# Patient Record
Sex: Female | Born: 1990 | Race: White | Hispanic: No | Marital: Married | Smoking: Former smoker
Health system: Southern US, Community
[De-identification: ages and names within clinical notes are randomized; demographics above are authoritative.]

## PROBLEM LIST (undated history)

## (undated) ENCOUNTER — Inpatient Hospital Stay: Payer: Self-pay

## (undated) DIAGNOSIS — K5090 Crohn's disease, unspecified, without complications: Secondary | ICD-10-CM

## (undated) DIAGNOSIS — F419 Anxiety disorder, unspecified: Secondary | ICD-10-CM

## (undated) DIAGNOSIS — D649 Anemia, unspecified: Secondary | ICD-10-CM

## (undated) DIAGNOSIS — N939 Abnormal uterine and vaginal bleeding, unspecified: Secondary | ICD-10-CM

## (undated) DIAGNOSIS — Z8041 Family history of malignant neoplasm of ovary: Secondary | ICD-10-CM

## (undated) DIAGNOSIS — N390 Urinary tract infection, site not specified: Secondary | ICD-10-CM

## (undated) DIAGNOSIS — N809 Endometriosis, unspecified: Secondary | ICD-10-CM

## (undated) DIAGNOSIS — J45909 Unspecified asthma, uncomplicated: Secondary | ICD-10-CM

## (undated) DIAGNOSIS — O99013 Anemia complicating pregnancy, third trimester: Secondary | ICD-10-CM

## (undated) DIAGNOSIS — O039 Complete or unspecified spontaneous abortion without complication: Secondary | ICD-10-CM

## (undated) DIAGNOSIS — N946 Dysmenorrhea, unspecified: Secondary | ICD-10-CM

## (undated) DIAGNOSIS — K589 Irritable bowel syndrome without diarrhea: Secondary | ICD-10-CM

## (undated) HISTORY — DX: Anemia, unspecified: D64.9

## (undated) HISTORY — DX: Anxiety disorder, unspecified: F41.9

## (undated) HISTORY — DX: Endometriosis, unspecified: N80.9

## (undated) HISTORY — DX: Abnormal uterine and vaginal bleeding, unspecified: N93.9

## (undated) HISTORY — DX: Anemia complicating pregnancy, third trimester: O99.013

## (undated) HISTORY — PX: COLONOSCOPY: SHX174

## (undated) HISTORY — DX: Urinary tract infection, site not specified: N39.0

## (undated) HISTORY — DX: Irritable bowel syndrome, unspecified: K58.9

## (undated) HISTORY — DX: Crohn's disease, unspecified, without complications: K50.90

## (undated) HISTORY — PX: ANKLE RECONSTRUCTION: SHX1151

## (undated) HISTORY — DX: Complete or unspecified spontaneous abortion without complication: O03.9

## (undated) HISTORY — DX: Family history of malignant neoplasm of ovary: Z80.41

## (undated) HISTORY — PX: TONSILLECTOMY: SUR1361

## (undated) HISTORY — DX: Dysmenorrhea, unspecified: N94.6

---
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
## 2011-03-06 ENCOUNTER — Ambulatory Visit: Payer: Self-pay | Admitting: Obstetrics and Gynecology

## 2011-03-12 ENCOUNTER — Ambulatory Visit: Payer: Self-pay | Admitting: Obstetrics and Gynecology

## 2011-03-14 LAB — PATHOLOGY REPORT

## 2011-06-17 ENCOUNTER — Other Ambulatory Visit: Payer: Self-pay | Admitting: Obstetrics and Gynecology

## 2011-06-18 ENCOUNTER — Ambulatory Visit: Payer: Self-pay | Admitting: Obstetrics and Gynecology

## 2011-07-26 ENCOUNTER — Encounter: Payer: Self-pay | Admitting: Obstetrics & Gynecology

## 2011-12-13 ENCOUNTER — Observation Stay: Payer: Self-pay | Admitting: Obstetrics and Gynecology

## 2011-12-13 LAB — COMPREHENSIVE METABOLIC PANEL
Albumin: 2.8 g/dL — ABNORMAL LOW (ref 3.4–5.0)
Anion Gap: 10 (ref 7–16)
BUN: 7 mg/dL (ref 7–18)
Bilirubin,Total: 0.2 mg/dL (ref 0.2–1.0)
Calcium, Total: 8.4 mg/dL — ABNORMAL LOW (ref 8.5–10.1)
Chloride: 104 mmol/L (ref 98–107)
Co2: 24 mmol/L (ref 21–32)
Creatinine: 0.5 mg/dL — ABNORMAL LOW (ref 0.60–1.30)
Glucose: 96 mg/dL (ref 65–99)
Potassium: 3.6 mmol/L (ref 3.5–5.1)
SGOT(AST): 24 U/L (ref 15–37)
SGPT (ALT): 18 U/L
Total Protein: 7.1 g/dL (ref 6.4–8.2)

## 2011-12-13 LAB — URINALYSIS, COMPLETE
Bilirubin,UR: NEGATIVE
Blood: NEGATIVE
Glucose,UR: 50 mg/dL (ref 0–75)
Leukocyte Esterase: NEGATIVE
Ph: 6 (ref 4.5–8.0)
Protein: NEGATIVE
RBC,UR: <1 /HPF (ref 0–5)
Squamous Epithelial: 1
WBC UR: 1 /HPF (ref 0–5)

## 2011-12-13 LAB — CBC WITH DIFFERENTIAL/PLATELET
Basophil #: 0 10*3/uL (ref 0.0–0.1)
Basophil %: 0 %
Eosinophil #: 0 10*3/uL (ref 0.0–0.7)
Eosinophil %: 0.1 %
Lymphocyte %: 10.4 %
MCH: 26.7 pg (ref 26.0–34.0)
MCHC: 32.8 g/dL (ref 32.0–36.0)
MCV: 81 fL (ref 80–100)
RDW: 17.5 % — ABNORMAL HIGH (ref 11.5–14.5)
WBC: 11.8 10*3/uL — ABNORMAL HIGH (ref 3.6–11.0)

## 2011-12-25 ENCOUNTER — Observation Stay: Payer: Self-pay | Admitting: Obstetrics and Gynecology

## 2011-12-25 LAB — URINALYSIS, COMPLETE
Bilirubin,UR: NEGATIVE
Blood: NEGATIVE
Glucose,UR: NEGATIVE mg/dL (ref 0–75)
Ketone: NEGATIVE
Nitrite: NEGATIVE
Ph: 7 (ref 4.5–8.0)
RBC,UR: <1 /HPF (ref 0–5)
Specific Gravity: 1.011 (ref 1.003–1.030)

## 2011-12-28 ENCOUNTER — Ambulatory Visit: Payer: Self-pay | Admitting: Oncology

## 2012-01-15 ENCOUNTER — Observation Stay: Payer: Self-pay | Admitting: Obstetrics and Gynecology

## 2012-01-18 ENCOUNTER — Observation Stay: Payer: Self-pay | Admitting: Obstetrics and Gynecology

## 2012-01-19 ENCOUNTER — Observation Stay: Payer: Self-pay | Admitting: Obstetrics and Gynecology

## 2012-01-19 LAB — BASIC METABOLIC PANEL
Calcium, Total: 7.7 mg/dL — ABNORMAL LOW (ref 8.5–10.1)
Co2: 24 mmol/L (ref 21–32)
EGFR (African American): >60
EGFR (Non-African Amer.): >60
Glucose: 191 mg/dL — ABNORMAL HIGH (ref 65–99)
Osmolality: 281 (ref 275–301)
Potassium: 3.9 mmol/L (ref 3.5–5.1)

## 2012-01-28 ENCOUNTER — Observation Stay: Payer: Self-pay

## 2012-01-29 ENCOUNTER — Inpatient Hospital Stay: Payer: Self-pay | Admitting: Obstetrics and Gynecology

## 2012-01-29 LAB — CBC WITH DIFFERENTIAL/PLATELET
Basophil %: 0.1 %
Eosinophil #: 0 10*3/uL (ref 0.0–0.7)
Eosinophil %: 0 %
HCT: 30.3 % — ABNORMAL LOW (ref 35.0–47.0)
Lymphocyte #: 0.8 10*3/uL — ABNORMAL LOW (ref 1.0–3.6)
Lymphocyte %: 5.2 %
MCHC: 31.3 g/dL — ABNORMAL LOW (ref 32.0–36.0)
Monocyte #: 0.5 x10 3/mm (ref 0.2–0.9)
Monocyte %: 3.1 %
Platelet: 187 10*3/uL (ref 150–440)
RBC: 3.84 10*6/uL (ref 3.80–5.20)
WBC: 14.7 10*3/uL — ABNORMAL HIGH (ref 3.6–11.0)

## 2012-01-30 LAB — HEMOGLOBIN: HGB: 8.3 g/dL — ABNORMAL LOW (ref 12.0–16.0)

## 2012-03-24 ENCOUNTER — Emergency Department: Payer: Self-pay | Admitting: Emergency Medicine

## 2012-04-16 ENCOUNTER — Ambulatory Visit: Payer: Self-pay | Admitting: Anesthesiology

## 2012-05-07 ENCOUNTER — Encounter: Payer: Self-pay | Admitting: Anesthesiology

## 2012-05-18 ENCOUNTER — Encounter: Payer: Self-pay | Admitting: Anesthesiology

## 2012-06-17 ENCOUNTER — Encounter: Payer: Self-pay | Admitting: Anesthesiology

## 2012-09-17 LAB — HM COLONOSCOPY

## 2013-01-17 ENCOUNTER — Ambulatory Visit: Payer: Self-pay | Admitting: Family Medicine

## 2013-02-10 ENCOUNTER — Other Ambulatory Visit: Payer: Self-pay | Admitting: Family

## 2013-02-10 LAB — URINALYSIS, COMPLETE
Blood: NEGATIVE
Glucose,UR: NEGATIVE mg/dL (ref 0–75)
Ketone: NEGATIVE
Leukocyte Esterase: NEGATIVE
Nitrite: NEGATIVE
Ph: 6 (ref 4.5–8.0)
RBC,UR: NONE SEEN /HPF (ref 0–5)
Specific Gravity: 1.02 (ref 1.003–1.030)
WBC UR: NONE SEEN /HPF (ref 0–5)

## 2013-02-10 LAB — IRON AND TIBC
Iron Bind.Cap.(Total): 481 ug/dL — ABNORMAL HIGH (ref 250–450)
Iron Saturation: 7 %

## 2013-02-10 LAB — FERRITIN: Ferritin (ARMC): 5 ng/mL — ABNORMAL LOW (ref 8–388)

## 2013-02-10 LAB — PREGNANCY, URINE: Pregnancy Test, Urine: NEGATIVE m[IU]/mL

## 2013-09-19 ENCOUNTER — Ambulatory Visit: Payer: Self-pay

## 2013-09-19 LAB — CBC WITH DIFFERENTIAL/PLATELET
BASOS ABS: 0 10*3/uL (ref 0.0–0.1)
BASOS PCT: 0.2 %
EOS PCT: 0.1 %
Eosinophil #: 0 10*3/uL (ref 0.0–0.7)
HCT: 34.9 % — ABNORMAL LOW (ref 35.0–47.0)
HGB: 11.1 g/dL — AB (ref 12.0–16.0)
LYMPHS PCT: 9.2 %
Lymphocyte #: 0.9 10*3/uL — ABNORMAL LOW (ref 1.0–3.6)
MCH: 25 pg — ABNORMAL LOW (ref 26.0–34.0)
MCHC: 31.9 g/dL — AB (ref 32.0–36.0)
MCV: 79 fL — ABNORMAL LOW (ref 80–100)
Monocyte #: 0.7 x10 3/mm (ref 0.2–0.9)
Monocyte %: 6.4 %
NEUTROS ABS: 8.5 10*3/uL — AB (ref 1.4–6.5)
NEUTROS PCT: 84.1 %
PLATELETS: 181 10*3/uL (ref 150–440)
RBC: 4.44 10*6/uL (ref 3.80–5.20)
RDW: 15 % — AB (ref 11.5–14.5)
WBC: 10.2 10*3/uL (ref 3.6–11.0)

## 2013-09-19 LAB — MONONUCLEOSIS SCREEN: MONO TEST: NEGATIVE

## 2013-09-19 LAB — RAPID STREP-A WITH REFLX: MICRO TEXT REPORT: NEGATIVE

## 2013-09-19 LAB — RAPID INFLUENZA A&B ANTIGENS

## 2013-09-23 LAB — BETA STREP CULTURE(ARMC)

## 2013-09-30 ENCOUNTER — Emergency Department: Payer: Self-pay | Admitting: Internal Medicine

## 2014-01-06 DIAGNOSIS — O039 Complete or unspecified spontaneous abortion without complication: Secondary | ICD-10-CM

## 2014-01-06 HISTORY — DX: Complete or unspecified spontaneous abortion without complication: O03.9

## 2014-08-19 LAB — HM PAP SMEAR
HM PAP: NEGATIVE
HM Pap smear: NEGATIVE

## 2014-09-18 ENCOUNTER — Ambulatory Visit: Payer: Self-pay | Admitting: Internal Medicine

## 2014-09-18 LAB — COMPREHENSIVE METABOLIC PANEL
ALBUMIN: 3.5 g/dL (ref 3.4–5.0)
ALK PHOS: 85 U/L
ANION GAP: 10 (ref 7–16)
BUN: 5 mg/dL — AB (ref 7–18)
Bilirubin,Total: <0.1 mg/dL — ABNORMAL LOW (ref 0.2–1.0)
CALCIUM: 8.7 mg/dL (ref 8.5–10.1)
Chloride: 102 mmol/L (ref 98–107)
Co2: 26 mmol/L (ref 21–32)
Creatinine: 0.8 mg/dL (ref 0.60–1.30)
EGFR (African American): >60
EGFR (Non-African Amer.): >60
GLUCOSE: 96 mg/dL (ref 65–99)
OSMOLALITY: 273 (ref 275–301)
POTASSIUM: 3.5 mmol/L (ref 3.5–5.1)
SGOT(AST): 25 U/L (ref 15–37)
SGPT (ALT): 27 U/L
SODIUM: 138 mmol/L (ref 136–145)
Total Protein: 7.9 g/dL (ref 6.4–8.2)

## 2014-09-18 LAB — PROTIME-INR
INR: 1
PROTHROMBIN TIME: 12.7 s (ref 11.5–14.7)

## 2014-09-18 LAB — CBC WITH DIFFERENTIAL/PLATELET
BASOS ABS: 0 10*3/uL (ref 0.0–0.1)
BASOS PCT: 0.5 %
EOS ABS: 0 10*3/uL (ref 0.0–0.7)
Eosinophil %: 0.7 %
HCT: 34.2 % — AB (ref 35.0–47.0)
HGB: 10.7 g/dL — ABNORMAL LOW (ref 12.0–16.0)
LYMPHS ABS: 1.4 10*3/uL (ref 1.0–3.6)
LYMPHS PCT: 24.6 %
MCH: 24.7 pg — ABNORMAL LOW (ref 26.0–34.0)
MCHC: 31.2 g/dL — AB (ref 32.0–36.0)
MCV: 79 fL — ABNORMAL LOW (ref 80–100)
MONOS PCT: 7.6 %
Monocyte #: 0.4 x10 3/mm (ref 0.2–0.9)
NEUTROS ABS: 3.8 10*3/uL (ref 1.4–6.5)
Neutrophil %: 66.6 %
Platelet: 173 10*3/uL (ref 150–440)
RBC: 4.32 10*6/uL (ref 3.80–5.20)
RDW: 14.5 % (ref 11.5–14.5)
WBC: 5.7 10*3/uL (ref 3.6–11.0)

## 2014-09-18 LAB — TSH: THYROID STIMULATING HORM: 2.08 u[IU]/mL

## 2014-09-18 LAB — APTT: Activated PTT: 27.4 secs (ref 23.6–35.9)

## 2014-09-18 LAB — GC/CHLAMYDIA PROBE AMP

## 2014-10-20 ENCOUNTER — Ambulatory Visit: Payer: Self-pay | Admitting: Otolaryngology

## 2014-11-07 ENCOUNTER — Emergency Department: Payer: Self-pay | Admitting: Emergency Medicine

## 2015-01-07 DIAGNOSIS — N809 Endometriosis, unspecified: Secondary | ICD-10-CM | POA: Insufficient documentation

## 2015-01-07 DIAGNOSIS — F32A Depression, unspecified: Secondary | ICD-10-CM | POA: Insufficient documentation

## 2015-01-07 DIAGNOSIS — N939 Abnormal uterine and vaginal bleeding, unspecified: Secondary | ICD-10-CM | POA: Insufficient documentation

## 2015-01-07 DIAGNOSIS — O039 Complete or unspecified spontaneous abortion without complication: Secondary | ICD-10-CM

## 2015-01-07 DIAGNOSIS — Z720 Tobacco use: Secondary | ICD-10-CM | POA: Insufficient documentation

## 2015-01-07 DIAGNOSIS — N946 Dysmenorrhea, unspecified: Secondary | ICD-10-CM | POA: Insufficient documentation

## 2015-01-07 DIAGNOSIS — F419 Anxiety disorder, unspecified: Secondary | ICD-10-CM | POA: Insufficient documentation

## 2015-01-10 LAB — SURGICAL PATHOLOGY

## 2015-01-25 NOTE — H&P (Signed)
L&D Evaluation:  History:   HPI 24 y/o G1 [facility] 02/02/12    Presents with contractions    Patient's Medical History No Chronic Illness    Patient's Surgical History none    Medications Pre Natal Vitamins    Allergies PCN    Social History none    Family History Non-Contributory   ROS:   ROS All systems were reviewed.  HEENT, CNS, GI, GU, Respiratory, CV, Renal and Musculoskeletal systems were found to be normal.   Exam:   Vital Signs stable    General no apparent distress, back pain with uc's    Abdomen gravid, non-tender    Estimated Fetal Weight Average for gestational age    Back no CVAT    Pelvic 5/100/-1    Ucx regular   Impression:   Impression active labor   Plan:   Comments routine intrapartum orders   Electronic Signatures: Edison Nasuti (MD)  (Signed 14-May-13 18:43)  Authored: L&D Evaluation   Last Updated: 14-May-13 18:43 by Edison Nasuti (MD)

## 2015-01-25 NOTE — H&P (Signed)
L&D Evaluation:  History:   HPI 24yo G1P0 with LMP of ? & EDD at 7 6/7 wks of 02/02/12 with PNC at [facility] significant for Anemia requiring 2 units of blood this pregnancy & Vit B12 injections q 2 weeks.  Pt presents with "UC's all night long and decreased FM tonight", No VB, LOF.    Presents with contractions    Patient's Medical History Hiatal hernia    Patient's Surgical History Lap for endometriosis, endoscopy and colonoscopy    Medications Pre Natal Vitamins    Allergies PCN, Sulfa, Amoxicillin    Social History tobacco  quit tobacco 06/16/11    Family History Non-Contributory   ROS:   ROS All systems were reviewed.  HEENT, CNS, GI, GU, Respiratory, CV, Renal and Musculoskeletal systems were found to be normal.   Exam:   Vital Signs stable    General no apparent distress    Mental Status clear    Chest clear    Heart normal sinus rhythm, no murmur/gallop/rubs    Abdomen gravid, non-tender    Estimated Fetal Weight Small for gestational age    Fetal Position vtx    Back no CVAT    Reflexes 1+    Clonus negative    Pelvic 1-2/80/vtx -2 very post cx    Mebranes Intact    FHT normal rate with no decels, reactive NST with 2 accels 15 x 15 bpm    Ucx irregular    Skin dry    Lymph no lymphadenopathy   Impression:   Impression IUP at 39 3/7 weeks   Plan:   Plan monitor contractions and for cervical change, GBs neg    Comments Cx has not changed since office exam 1-2 weeks ago. Will dc home to rest and fu here if UC's increase or if ROM, VB or decreased FM occurs.   Electronic Signatures: Catheryn Bacon (CNM)  (Signed 13-May-13 07:41)  Authored: L&D Evaluation   Last Updated: 13-May-13 07:41 by Catheryn Bacon (CNM)

## 2015-02-17 ENCOUNTER — Ambulatory Visit (INDEPENDENT_AMBULATORY_CARE_PROVIDER_SITE_OTHER): Payer: 59 | Admitting: Obstetrics and Gynecology

## 2015-02-17 ENCOUNTER — Encounter: Payer: Self-pay | Admitting: Obstetrics and Gynecology

## 2015-02-17 VITALS — BP 102/70 | HR 94 | Ht 64.0 in | Wt 128.2 lb

## 2015-02-17 DIAGNOSIS — N809 Endometriosis, unspecified: Secondary | ICD-10-CM | POA: Diagnosis not present

## 2015-02-17 DIAGNOSIS — Z3169 Encounter for other general counseling and advice on procreation: Secondary | ICD-10-CM

## 2015-02-17 DIAGNOSIS — F419 Anxiety disorder, unspecified: Secondary | ICD-10-CM

## 2015-02-17 NOTE — Progress Notes (Signed)
Patient ID: Sarah Edwards, female   DOB: June 12, 1991, 24 y.o.   MRN: [id number]  OB/GYN CONFERENCE NOTE:  Subjective:       Sarah Edwards is a 24 y.o. G32P1011 female who presents for a conference appointment. Current complaints include: Chronic fatigue, anxiety, symptoms, minimal dysmenorrhea.  Patient is desiring to return to work.  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  She would like to go back on Zoloft.  She had discontinued this medication approximately 3-4 months ago.  Her endometriosis symptoms are minimal with mild dysmenorrhea.  She is currently tryi to conceive and I have recommended that she take multivitamins daily in order to lower spina bifida risk.     Gynecologic History Patient's last menstrual period was 02/03/2015 (exact date). Contraception: none  Obstetric History OB History  Gravida Para Term Preterm AB SAB TAB Ectopic Multiple Living  2 1 1  0 1 1 0 0 0 1    # Outcome Date GA Lbr Len/2nd Weight Sex Delivery Anes PTL Lv  2 Term 09/17/09   6 lb 12 oz (3.062 kg) F Vag-Spont   Y  1 SAB        N       Past Medical History  Diagnosis Date  . SAB (spontaneous abortion) 01/06/2014  . Endometriosis   . Painful menstrual periods   . Abnormal uterine bleeding (AUB)   . Anxiety   . Crohn disease     Past Surgical History  Procedure Laterality Date  . Colonoscopy N/A 2010 and 2011    crohns  . Esophagogastroduodenoscopy endoscopy N/A 2010    Current Outpatient Prescriptions on File Prior to Visit  Medication Sig Dispense Refill  . Norgestimate-Ethinyl Estradiol Triphasic 0.18/0.215/0.25 MG-35 MCG tablet Take 1 tablet by mouth daily.    . sertraline (ZOLOFT) 50 MG tablet Take 50 mg by mouth daily.     No current facility-administered medications on file prior to visit.    Allergies  Allergen Reactions  . Sulfa Antibiotics Nausea And Vomiting    Constant vomiting  . Penicillins Rash    History   Social History  .  Marital Status: Single    Spouse Name: N/A  . Number of Children: N/A  . Years of Education: N/A   Occupational History  . Not on file.   Social History Main Topics  . Smoking status: Current Every Day Smoker -- 0.50 packs/day    Types: Cigarettes  . Smokeless tobacco: Never Used  . Alcohol Use: 0.0 oz/week    0 Standard drinks or equivalent per week  . Drug Use: No  . Sexual Activity: Yes    Birth Control/ Protection: None   Other Topics Concern  . Not on file   Social History Narrative    Family History  Problem Relation Age of Onset  . Cancer Paternal Grandmother     breast   . Ovarian cancer Paternal Grandmother   . Cervical cancer Paternal Grandmother   . Lung cancer Paternal Grandfather   . Leukemia Maternal Grandfather   . Diabetes Mother   . Diabetes Father     The following portions of the patient's history were reviewed and updated as appropriate: allergies, current medications, past family history, past medical history, past social history, past surgical history and problem list.  Review of Systems A comprehensive review of systems was negative except for: Behavioral/Psych: positive for anxiety   Objective:   BP 102/70 mmHg  Pulse 94  Ht 5' 4"  (1.626 m)  Wt 128 lb 4 oz (58.174 kg)  BMI 22.00 kg/m2  LMP 02/03/2015 (Exact Date)   Assessment/Plan:   1.  Anxiety. 2.  Preconception counseling. 3.  Endometriosis.  1.  Resume Zoloft 50 mg a day. 2.  Multivitamin or prenatal vitamin daily. 3.  Prior to restarting birth control pills, check for pregnancy with UPT. 4.  RTC 6 months for annual exam.  Patient Active Problem List   Diagnosis Date Noted  . Crohn disease 01/07/2015  . Endometriosis 01/07/2015  . Tobacco user 01/07/2015  . Anxiety 01/07/2015  . Abnormal uterine bleeding 01/07/2015  . SAB (spontaneous abortion) 01/07/2015  . Painful menstrual periods 01/07/2015       Time: A total of 15 minutes were spent face-to-face with the  patient during this encounter and over half of that time dealt with counseling and coordination of care.  Return to Clinic: 6 months   Ardell Isaacs, MD [facility]

## 2015-05-26 ENCOUNTER — Ambulatory Visit
Admission: EM | Admit: 2015-05-26 | Discharge: 2015-05-26 | Disposition: A | Discharge status: Home / Self Care | Payer: 59 | Attending: Family Medicine | Admitting: Family Medicine

## 2015-05-26 ENCOUNTER — Ambulatory Visit: Payer: 59

## 2015-05-26 DIAGNOSIS — D649 Anemia, unspecified: Secondary | ICD-10-CM | POA: Diagnosis not present

## 2015-05-26 DIAGNOSIS — E876 Hypokalemia: Secondary | ICD-10-CM

## 2015-05-26 DIAGNOSIS — R202 Paresthesia of skin: Secondary | ICD-10-CM

## 2015-05-26 DIAGNOSIS — K5090 Crohn's disease, unspecified, without complications: Secondary | ICD-10-CM | POA: Diagnosis not present

## 2015-05-26 LAB — COMPREHENSIVE METABOLIC PANEL
ALBUMIN: 4.5 g/dL (ref 3.5–5.0)
ALT: 18 U/L (ref 14–54)
AST: 25 U/L (ref 15–41)
Alkaline Phosphatase: 76 U/L (ref 38–126)
Anion gap: 14 (ref 5–15)
BUN: 10 mg/dL (ref 6–20)
CO2: 23 mmol/L (ref 22–32)
CREATININE: 0.67 mg/dL (ref 0.44–1.00)
Calcium: 9.2 mg/dL (ref 8.9–10.3)
Chloride: 101 mmol/L (ref 101–111)
GFR calc Af Amer: >60 mL/min (ref >60)
GLUCOSE: 89 mg/dL (ref 65–99)
Potassium: 3.3 mmol/L — ABNORMAL LOW (ref 3.5–5.1)
SODIUM: 138 mmol/L (ref 135–145)
Total Bilirubin: 0.5 mg/dL (ref 0.3–1.2)
Total Protein: 8.3 g/dL — ABNORMAL HIGH (ref 6.5–8.1)

## 2015-05-26 LAB — CBC WITH DIFFERENTIAL/PLATELET
BASOS ABS: 0 10*3/uL (ref 0–0.1)
BASOS PCT: 1 %
EOS PCT: 1 %
Eosinophils Absolute: 0.1 10*3/uL (ref 0–0.7)
HCT: 35.8 % (ref 35.0–47.0)
Hemoglobin: 11.3 g/dL — ABNORMAL LOW (ref 12.0–16.0)
LYMPHS PCT: 38 %
Lymphs Abs: 2.2 10*3/uL (ref 1.0–3.6)
MCH: 24 pg — ABNORMAL LOW (ref 26.0–34.0)
MCHC: 31.6 g/dL — ABNORMAL LOW (ref 32.0–36.0)
MCV: 76.1 fL — AB (ref 80.0–100.0)
Monocytes Absolute: 0.3 10*3/uL (ref 0.2–0.9)
Monocytes Relative: 6 %
Neutro Abs: 3.2 10*3/uL (ref 1.4–6.5)
Neutrophils Relative %: 54 %
PLATELETS: 240 10*3/uL (ref 150–440)
RBC: 4.7 MIL/uL (ref 3.80–5.20)
RDW: 15.7 % — ABNORMAL HIGH (ref 11.5–14.5)
WBC: 5.9 10*3/uL (ref 3.6–11.0)

## 2015-05-26 LAB — IRON AND TIBC
Iron: 36 ug/dL (ref 28–170)
Saturation Ratios: 7 % — ABNORMAL LOW (ref 10.4–31.8)
TIBC: 502 ug/dL — ABNORMAL HIGH (ref 250–450)
UIBC: 466 ug/dL

## 2015-05-26 LAB — MAGNESIUM: MAGNESIUM: 2.2 mg/dL (ref 1.7–2.4)

## 2015-05-26 MED ORDER — POTASSIUM CHLORIDE CRYS ER 20 MEQ PO TBCR
40.0000 meq | EXTENDED_RELEASE_TABLET | Freq: Once | ORAL | Status: AC
Start: 2015-05-26 — End: 2015-05-26
  Administered 2015-05-26: 40 meq via ORAL

## 2015-05-26 NOTE — ED Notes (Signed)
Pt states "I have right arm and leg pain with numbness for 4 days. I have had these same symptoms off and on for a year but these last four days have been intense and the pain and numbness has not gone away." Pt ambulated to room without problem, clear speech, denies fevers, nausea or vomiting.

## 2015-05-26 NOTE — ED Provider Notes (Addendum)
CSN: [id number]     Arrival date & time 05/26/15  1039 History   First MD Initiated Contact with Patient 05/26/15 1323     Chief Complaint  Patient presents with  . Arm Pain  . Leg Pain   (Consider location/radiation/quality/duration/timing/severity/associated sxs/prior Treatment) HPI Comments: Single caucasian female with recurrent right arm and leg numbness and pain s/p exertion.  Right hand dominant.  Symptoms first started 3 years ago with Crohn's diagnosis and s/p epidural for SVD 2013.  Typically only last for one day and resolve.  This week has been 4 days of symptoms, not getting better.  Patient contacted [facility] Dr Army Melia and unable to get appt here for evaluation.  Has had tonsillectomy this year.  Last Crohn's flare mid August with diarrhea.  Currently with soft brown stools x 4 days TID denied bloody or tarry stools. Usually constipated and stools once every 3 days.  Eats 3 meals a day and snacks.  Hx anemia due to Crohns and endometriosis currently not taking daily medications or multivitamin.  Last labs prior to tonsillectomy.  Has not hand any xrays since epidural.  Works at [employer].  Has noticed dropping things at work last episode Monday.  Leg numbness right with every episode of sexual intercourse.   Denied trauma.  Has tried elevating legs without any relief of symptoms.  Denied loss of bowel/bladder control, saddle paresthesias or leg weakness.  Patient is a 24 y.o. female presenting with arm pain and leg pain. The history is provided by the patient.  Arm Pain This is a recurrent problem. The current episode started more than 2 days ago. The problem occurs daily. The problem has not changed since onset.Pertinent negatives include no chest pain, no abdominal pain, no headaches and no shortness of breath. The symptoms are aggravated by exertion. Nothing relieves the symptoms. She has tried rest, food and water for the symptoms. The treatment provided no relief.  Leg  Pain Associated symptoms: no back pain, no fatigue, no fever and no neck pain     Past Medical History  Diagnosis Date  . SAB (spontaneous abortion) 01/06/2014  . Endometriosis   . Painful menstrual periods   . Abnormal uterine bleeding (AUB)   . Anxiety   . Crohn disease    Past Surgical History  Procedure Laterality Date  . Colonoscopy N/A 2010 and 2011    crohns  . Esophagogastroduodenoscopy endoscopy N/A 2010   Family History  Problem Relation Age of Onset  . Cancer Paternal Grandmother     breast   . Ovarian cancer Paternal Grandmother   . Cervical cancer Paternal Grandmother   . Lung cancer Paternal Grandfather   . Leukemia Maternal Grandfather   . Diabetes Mother   . Diabetes Father    Social History  Substance Use Topics  . Smoking status: Current Every Day Smoker -- 0.50 packs/day    Types: Cigarettes  . Smokeless tobacco: Never Used  . Alcohol Use: 0.0 oz/week    0 Standard drinks or equivalent per week   OB History    Gravida Para Term Preterm AB TAB SAB Ectopic Multiple Living   2 1 1  0 1 0 1 0 0 1     Review of Systems  Constitutional: Negative for fever, chills, diaphoresis, activity change, appetite change and fatigue.  HENT: Negative for congestion, dental problem, drooling, ear discharge, ear pain, facial swelling, hearing loss, mouth sores, nosebleeds, postnasal drip, rhinorrhea, sinus pressure, sneezing, sore throat, trouble swallowing and  voice change.   Eyes: Negative for photophobia, pain, discharge, redness, itching and visual disturbance.  Respiratory: Negative for cough, choking, shortness of breath, wheezing and stridor.   Cardiovascular: Negative for chest pain, palpitations and leg swelling.  Gastrointestinal: Positive for diarrhea and constipation. Negative for nausea, vomiting, abdominal pain, blood in stool, abdominal distention, anal bleeding and rectal pain.  Endocrine: Negative for cold intolerance and heat intolerance.   Genitourinary: Negative for dysuria, urgency, frequency, flank pain, decreased urine volume, enuresis and difficulty urinating.  Musculoskeletal: Positive for myalgias. Negative for back pain, joint swelling, arthralgias, gait problem, neck pain and neck stiffness.  Skin: Negative for color change, pallor, rash and wound.  Allergic/Immunologic: Negative for environmental allergies and food allergies.  Neurological: Positive for weakness. Negative for dizziness, tremors, seizures, syncope, facial asymmetry, speech difficulty, light-headedness, numbness and headaches.  Hematological: Negative for adenopathy. Does not bruise/bleed easily.  Psychiatric/Behavioral: Negative for behavioral problems, confusion, sleep disturbance and agitation.    Allergies  Sulfa antibiotics and Penicillins  Home Medications   Prior to Admission medications   Medication Sig Start Date End Date Taking? Authorizing Provider  Norgestimate-Ethinyl Estradiol Triphasic 0.18/0.215/0.25 MG-35 MCG tablet Take 1 tablet by mouth daily.    Historical Provider, MD  sertraline (ZOLOFT) 50 MG tablet Take 50 mg by mouth daily.    Historical Provider, MD   Meds Ordered and Administered this Visit   Medications  potassium chloride SA (K-DUR,KLOR-CON) CR tablet 40 mEq (40 mEq Oral Given 05/26/15 1601)    BP 112/71 mmHg  Pulse 86  Temp(Src) 97.7 F (36.5 C) (Oral)  Resp 16  Ht 5' 5"  (1.651 m)  Wt 130 lb (58.968 kg)  BMI 21.63 kg/m2  SpO2 100%  LMP 05/25/2015 No data found.   Physical Exam  Constitutional: She is oriented to person, place, and time. Vital signs are normal. She appears well-developed and well-nourished. No distress.  HENT:  Head: Normocephalic and atraumatic.  Right Ear: Hearing, external ear and ear canal normal. A middle ear effusion is present.  Left Ear: Hearing, external ear and ear canal normal. A middle ear effusion is present.  Nose: Nose normal. No mucosal edema, rhinorrhea, nose lacerations,  sinus tenderness, nasal deformity, septal deviation or nasal septal hematoma. No epistaxis.  No foreign bodies. Right sinus exhibits no maxillary sinus tenderness and no frontal sinus tenderness. Left sinus exhibits no maxillary sinus tenderness and no frontal sinus tenderness.  Mouth/Throat: Uvula is midline, oropharynx is clear and moist and mucous membranes are normal. Mucous membranes are not pale, not dry and not cyanotic. She does not have dentures. No oral lesions. No trismus in the jaw. Normal dentition. No dental abscesses, uvula swelling, lacerations or dental caries. No oropharyngeal exudate, posterior oropharyngeal edema, posterior oropharyngeal erythema or tonsillar abscesses.  Bilateral TMs with air fluid level clear; wearing glasses  Eyes: Conjunctivae, EOM and lids are normal. Pupils are equal, round, and reactive to light. Right eye exhibits no discharge. Left eye exhibits no discharge. No scleral icterus.  Neck: Trachea normal and normal range of motion. Neck supple. No tracheal deviation present.  Cardiovascular: Normal rate, regular rhythm, normal heart sounds and intact distal pulses.  Exam reveals no gallop and no friction rub.   No murmur heard. Pulmonary/Chest: Effort normal and breath sounds normal. No stridor. No respiratory distress. She has no wheezes. She has no rales. She exhibits no tenderness.  Abdominal: Soft. Normal appearance. She exhibits no shifting dullness, no distension, no pulsatile liver, no fluid wave,  no abdominal bruit, no ascites, no pulsatile midline mass and no mass. Bowel sounds are decreased. There is no hepatosplenomegaly. There is tenderness in the right lower quadrant and left lower quadrant. There is no rigidity, no rebound, no guarding, no CVA tenderness, no tenderness at McBurney's point and negative Murphy's sign. Hernia confirmed negative in the ventral area.  Dull to percussion x 4 quads  Musculoskeletal: Normal range of motion. She exhibits no  edema or tenderness.       Right shoulder: Normal.       Left shoulder: Normal.       Right elbow: Normal.      Left elbow: Normal.       Right wrist: Normal.       Left wrist: Normal.       Right hip: Normal.       Left hip: Normal.       Right knee: Normal.       Left knee: Normal.       Right ankle: Normal.       Left ankle: Normal.       Cervical back: Normal.       Thoracic back: Normal.       Lumbar back: Normal.       Right upper arm: Normal.       Left upper arm: Normal.       Right forearm: Normal.       Left forearm: Normal.       Arms:      Right hand: Normal.       Left hand: Normal.       Right upper leg: Normal.       Left upper leg: Normal.       Right lower leg: Normal.       Left lower leg: Normal.       Right foot: Normal.       Left foot: Normal.  Full heel/toe gait and AROM c/t/l spine and bilateral arms/legs in exam room;   Lymphadenopathy:    She has no cervical adenopathy.  Neurological: She is alert and oriented to person, place, and time. She is not disoriented. She displays no atrophy and no tremor. No cranial nerve deficit or sensory deficit. She exhibits normal muscle tone. She displays no seizure activity. Coordination and gait normal. GCS eye subscore is 4. GCS verbal subscore is 5. GCS motor subscore is 6.  Reflex Scores:      Brachioradialis reflexes are 2+ on the right side and 2+ on the left side.      Patellar reflexes are 1+ on the right side and 1+ on the left side. Skin: Skin is warm, dry and intact. No rash noted. She is not diaphoretic. No erythema. No pallor.  Psychiatric: She has a normal mood and affect. Her speech is normal and behavior is normal. Judgment and thought content normal. Cognition and memory are normal.  Nursing note and vitals reviewed.   ED Course  Procedures (including critical care time)  Labs Review Labs Reviewed  CBC WITH DIFFERENTIAL/PLATELET - Abnormal; Notable for the following:    Hemoglobin 11.3 (*)     MCV 76.1 (*)    MCH 24.0 (*)    MCHC 31.6 (*)    RDW 15.7 (*)    All other components within normal limits  COMPREHENSIVE METABOLIC PANEL - Abnormal; Notable for the following:    Potassium 3.3 (*)    Total Protein 8.3 (*)  All other components within normal limits  URINE CULTURE  IRON AND TIBC  VITAMIN D 25 HYDROXY  VITAMIN B12  MAGNESIUM    Imaging Review Dg Cervical Spine Complete  05/26/2015   CLINICAL DATA:  Intermittent pain for 1 year with recent progression intermittent right upper extremity radicular symptoms  EXAM: CERVICAL SPINE  4+ VIEWS  COMPARISON:  None.  FINDINGS: Frontal, lateral, open-mouth odontoid, and bilateral oblique views were obtained. There is no fracture or spondylolisthesis. Prevertebral soft tissues and predental space regions are normal. Disc spaces appear intact. No appreciable facet arthropathy.  IMPRESSION: No fracture or dislocation.  No appreciable arthropathy.   Electronically Signed   By: Lowella Grip III M.D.   On: 05/26/2015 14:49   Dg Thoracic Spine 2 View  05/26/2015   CLINICAL DATA:  Chronic back pain. Posterior neck pain with right arm pain and numbness and tingling. Low back pain with right leg numbness and tingling.  EXAM: THORACIC SPINE 2 VIEWS  COMPARISON:  Chest x-ray dated 09/30/2013  FINDINGS: There is no evidence of thoracic spine fracture. Alignment is normal. No other significant bone abnormalities are identified.  IMPRESSION: Normal exam.   Electronically Signed   By: Lorriane Shire M.D.   On: 05/26/2015 14:52   Dg Lumbar Spine Complete  05/26/2015   CLINICAL DATA:  Intermittent pain for 1 year with right lower extremity radicular symptoms  EXAM: LUMBAR SPINE - COMPLETE 4+ VIEW  COMPARISON:  None.  FINDINGS: Frontal, lateral, spot lumbosacral lateral common bile oblique views were obtained. There are 5 non-rib-bearing lumbar type vertebral bodies. No fracture or spondylolisthesis. There is incomplete fusion of the apophysis of the  anterior superior aspect of the L3 vertebral body. Disc spaces appear intact. No appreciable facet arthropathy.  IMPRESSION: No fracture or spondylolisthesis.  No appreciable arthropathy.   Electronically Signed   By: Lowella Grip III M.D.   On: 05/26/2015 14:51    1345 Discussed with patient some labs are ship out and may be next week until I have all results and will call her on cell [phone] with any results not returned today once available.  Discussed nerve compression, vitamin deficiencies, anemia could be causing her symptoms.  Patient agreed to xrays and labs.  Patient verbalized understanding of information/instructions, agreed with plan of care and had no further questions at this time.   1500 discussed CBC and xray results and patient given copy of all reports.  Still awaiting CMP and ship out lab results.  Patient tolerated snack without difficulty. Discussed ergonomics at work and [employer].  Probable repetitive motion injury paresthesia--24 year old at home and patient favors right leg since pregnancy puts more weight on left.  Current shoe treads comletely worn off discussed replacement of foot wear to allow proper body alignment.   Patient verbalized understanding of information/instructions, agreed with plan of care and had no further questions at this time.  1545 discussed CMP results with patient.  Patient agreed to po potassium.  Given copy of CMP results.  Patient verbalized understanding of information/instructions and had no further questions at this time.  Discharged ambulatory after ensuring no nausea/vomiting after potassium chloride administration.  Currently no paresthesias or pain at rest.  Patient quietly read magazines in exam room.  Gait sure and stable in hallway.  Filed Vitals:   05/26/15 1315  BP: 112/71  Pulse: 86  Temp: 97.7 F (36.5 C)  Resp: 16    Medications  potassium chloride SA (K-DUR,KLOR-CON)  CR tablet 40 mEq (40 mEq Oral  Given 05/26/15 1601)  given by RN Raylene Miyamoto.  MDM   1. Paresthesias   2. Anemia, unspecified anemia type   3. Hypokalemia   4. Crohn's disease, without complications    Worsening paresthesias anemia stable improved  but Hypokalemia today po potassium given and discussed potassium rich foods while she is having loose stools probable cause of potassium loss as typically one stool every 3 days now three times a day related to Chrohn's disease. Follow up ER or urgent care if muscle cramps, palpitations, SOB/dyspnea, chest pain, syncope. If all labs normal consider MRI.  Discussed with patient will call with lab results once available for ship outs FE/TIBC/Vit D/Vit B12.   Exitcare handout on hypokalemia, radiculopathy given to patient.  Patient verbalized understanding of information/agreed with plan of care and had no further questions at this time.  Anemia stable.  Patient to follow up with Dr Army Melia for her endometriosis and crohn's disease.  Start multivitamin OTC po daily ensure iron rich, folate foods every day.  Patient given exitcare handout on anemia.  Patient verbalized understanding of information/instructions, agreed with plan of care and had no further questions at this time.   Patient to schedule follow up for re-evaluation [facility] Dr Halina Maidens. Given copies of all radiology and lab reports available by time of discharge.  Discussed bland diet avoid spicy, fried, dairy or large portions of meat until back to normal stools.   Return to the clinic if  symptoms persist or worsen; I have alerted the patient to call if high fever, dehydration, marked weakness, fainting, increased abdominal pain, blood in stool or vomit.  Exitcare handout on crohn's disease given to patient.  Patient verbalized agreement and understanding of treatment plan.   P2:  Hand washing and fitness     Olen Cordial, NP 05/27/15 [id number]  03 Jun 2015 at [place]  patient contacted via telephone and discussed all  lab results Fe/TIBC/Mg/Vit B12, Vit D her results and normal ranges.  Discussed if still having symptoms to follow up with [facility] Dr Carolin Coy. Consider MRI.  Continue multivitamin, stretching.  Patient verbalized understanding of information/instructions, agreed with plan of care and had no further questions at this time.  Olen Cordial, NP 06/03/15 [phone]

## 2015-05-26 NOTE — Discharge Instructions (Signed)
Carpal Tunnel Syndrome Carpal tunnel syndrome is a disorder of the nervous system in the wrist that causes pain, hand weakness, and/or loss of feeling. Carpal tunnel syndrome is caused by the compression, stretching, or irritation of the median nerve at the wrist joint. Athletes who experience carpal tunnel syndrome may notice a decrease in their performance to the condition, especially for sports that require strong hand or wrist action.  SYMPTOMS   Tingling, numbness, or burning pain in the hand or fingers.  Inability to sleep due to pain in the hand.  Sharp pains that shoot from the wrist up the arm or to the fingers, especially at night.  Morning stiffness or cramping of the hand.  Thumb weakness, resulting in difficulty holding objects or making a fist.  Shiny, dry skin on the hand.  Reduced performance in any sport requiring a strong grip. CAUSES   Median nerve damage at the wrist is caused by pressure due to swelling, inflammation, or scarred tissue.  Sources of pressure include:  Repetitive gripping or squeezing that causes inflammation of the tendon sheaths.  Scarring or shortening of the ligament that covers the median nerve.  Traumatic injury to the wrist or forearm such as fracture, sprain, or dislocation.  Prolonged hyperextension (wrist bent backward) or hyperflexion (wrist bent downward) of the wrist. RISK INCREASES WITH:  Diabetes mellitus.  Menopause or amenorrhea.  Rheumatoid arthritis.  Raynaud disease.  Pregnancy.  Gout.  Kidney disease.  Ganglion cyst.  Repetitive hand or wrist action.  Hypothyroidism (underactive thyroid gland).  Repetitive jolting or shaking of the hands or wrist.  Prolonged forceful weight-bearing on the hands. PREVENTION  Bracing the hand and wrist straight during activities that involve repetitive grasping.  For activities that require prolonged extension of the wrist (bending towards the top of the forearm)  periodically change the position of your wrists.  Learn and use proper technique in activities that result in the wrist position in neutral to slight extension.  Avoid bending the wrist into full extension or flexion (up or down).  Keep the wrist in a straight (neutral) position. To keep the wrist in this position, wear a splint.  Avoid repetitive hand and wrist motions.  When possible avoid prolonged grasping of items (steering wheel of a car, a pen, a vacuum cleaner, or a rake).  Loosen your grip for activities that require prolonged grasping of items.  Place keyboards and writing surfaces at the correct height as to decrease strain on the wrist and hand.  Alternate work tasks to avoid prolonged wrist flexion.  Avoid pinching activities (needlework and writing) as they may irritate your carpal tunnel syndrome.  If these activities are necessary, complete them for shorter periods of time.  When writing, use a felt tip or rollerball pen and/or build up the grip on a pen to decrease the forces required for writing. PROGNOSIS  Carpal tunnel syndrome is usually curable with appropriate conservative treatment and sometimes resolves spontaneously. For some cases, surgery is necessary, especially if muscle wasting or nerve changes have developed.  RELATED COMPLICATIONS   Permanent numbness and a weak thumb or fingers in the affected hand.  Permanent paralysis of a portion of the hand and fingers. TREATMENT  Treatment initially consists of stopping activities that aggravate the symptoms as well as medication and ice to reduce inflammation. A wrist splint is often recommended for wear during activities of repetitive motion as well as at night. It is also important to learn and use proper technique when  performing activities that typically cause pain. On occasion, a corticosteroid injection may be given. If symptoms persist despite conservative treatment, surgery may be an option. Surgical  techniques free the pinched or compressed nerve. Carpal tunnel surgery is usually performed on an outpatient basis, meaning you go home the same day as surgery. These procedures provide almost complete relief of all symptoms in 95% of patients. Expect at least 2 weeks for healing after surgery. For cases that are the result of repeated jolting or shaking of the hand or wrist or prolonged hyperextension, surgery is not usually recommended because stretching of the median nerve, not compression, is usually the cause of carpal tunnel syndrome in these cases. MEDICATION   If pain medication is necessary, nonsteroidal anti-inflammatory medications, such as aspirin and ibuprofen, or other minor pain relievers, such as acetaminophen, are often recommended.  Do not take pain medication for 7 days before surgery.  Prescription pain relievers are usually only prescribed after surgery. Use only as directed and only as much as you need.  Corticosteroid injections may be given to reduce inflammation. However, they are not always recommended.  Vitamin B6 (pyridoxine) may reduce symptoms; use only if prescribed for your disorder. SEEK MEDICAL CARE IF:   Symptoms get worse or do not improve in 2 weeks despite treatment.  You also have a current or recent history of neck or shoulder injury that has resulted in pain or tingling elsewhere in your arm. Document Released: 09/03/2005 Document Revised: 01/18/2014 Document Reviewed: 12/16/2008 [facility] Patient Information 2015 [place], [place]. This information is not intended to replace advice given to you by your health care provider. Make sure you discuss any questions you have with your health care provider. Lumbosacral Radiculopathy Lumbosacral radiculopathy is a pinched nerve or nerves in the low back (lumbosacral area). When this happens you may have weakness in your legs and may not be able to stand on your toes. You may have pain going down into your legs. There  may be difficulties with walking normally. There are many causes of this problem. Sometimes this may happen from an injury, or simply from arthritis or boney problems. It may also be caused by other illnesses such as diabetes. If there is no improvement after treatment, further studies may be done to find the exact cause. DIAGNOSIS  X-rays may be needed if the problems become long standing. Electromyograms may be done. This study is one in which the working of nerves and muscles is studied. HOME CARE INSTRUCTIONS   Applications of ice packs may be helpful. Ice can be used in a plastic bag with a towel around it to prevent frostbite to skin. This may be used every 2 hours for 20 to 30 minutes, or as needed, while awake, or as directed by your caregiver.  Only take over-the-counter or prescription medicines for pain, discomfort, or fever as directed by your caregiver.  If physical therapy was prescribed, follow your caregiver's directions. SEEK IMMEDIATE MEDICAL CARE IF:   You have pain not controlled with medications.  You seem to be getting worse rather than better.  You develop increasing weakness in your legs.  You develop loss of bowel or bladder control.  You have difficulty with walking or balance, or develop clumsiness in the use of your legs.  You have a fever. MAKE SURE YOU:   Understand these instructions.  Will watch your condition.  Will get help right away if you are not doing well or get worse. Document Released: 09/03/2005 Document  Revised: 11/26/2011 Document Reviewed: 04/23/2008 [facility] Patient Information 2015 [place], [place]. This information is not intended to replace advice given to you by your health care provider. Make sure you discuss any questions you have with your health care provider. Cervical Radiculopathy Cervical radiculopathy happens when a nerve in the neck is pinched or bruised by a slipped (herniated) disk or by arthritic changes in the bones of the  cervical spine. This can occur due to an injury or as part of the normal aging process. Pressure on the cervical nerves can cause pain or numbness that runs from your neck all the way down into your arm and fingers. CAUSES  There are many possible causes, including:  Injury.  Muscle tightness in the neck from overuse.  Swollen, painful joints (arthritis).  Breakdown or degeneration in the bones and joints of the spine (spondylosis) due to aging.  Bone spurs that may develop near the cervical nerves. SYMPTOMS  Symptoms include pain, weakness, or numbness in the affected arm and hand. Pain can be severe or irritating. Symptoms may be worse when extending or turning the neck. DIAGNOSIS  Your caregiver will ask about your symptoms and do a physical exam. He or she may test your strength and reflexes. X-rays, CT scans, and MRI scans may be needed in cases of injury or if the symptoms do not go away after a period of time. Electromyography (EMG) or nerve conduction testing may be done to study how your nerves and muscles are working. TREATMENT  Your caregiver may recommend certain exercises to help relieve your symptoms. Cervical radiculopathy can, and often does, get better with time and treatment. If your problems continue, treatment options may include:  Wearing a soft collar for short periods of time.  Physical therapy to strengthen the neck muscles.  Medicines, such as nonsteroidal anti-inflammatory drugs (NSAIDs), oral corticosteroids, or spinal injections.  Surgery. Different types of surgery may be done depending on the cause of your problems. HOME CARE INSTRUCTIONS   Put ice on the affected area.  Put ice in a plastic bag.  Place a towel between your skin and the bag.  Leave the ice on for 15-20 minutes, 03-04 times a day or as directed by your caregiver.  If ice does not help, you can try using heat. Take a warm shower or bath, or use a hot water bottle as directed by your  caregiver.  You may try a gentle neck and shoulder massage.  Use a flat pillow when you sleep.  Only take over-the-counter or prescription medicines for pain, discomfort, or fever as directed by your caregiver.  If physical therapy was prescribed, follow your caregiver's directions.  If a soft collar was prescribed, use it as directed. SEEK IMMEDIATE MEDICAL CARE IF:   Your pain gets much worse and cannot be controlled with medicines.  You have weakness or numbness in your hand, arm, face, or leg.  You have a high fever or a stiff, rigid neck.  You lose bowel or bladder control (incontinence).  You have trouble with walking, balance, or speaking. MAKE SURE YOU:   Understand these instructions.  Will watch your condition.  Will get help right away if you are not doing well or get worse. Document Released: 05/29/2001 Document Revised: 11/26/2011 Document Reviewed: 04/17/2011 [facility] Patient Information 2015 [place], [place]. This information is not intended to replace advice given to you by your health care provider. Make sure you discuss any questions you have with your health care provider. Crohn  Disease Crohn disease is a long-term (chronic) soreness and redness (inflammation) of the intestines (bowel). It can affect any portion of the digestive tract, from the mouth to the anus. It can also cause problems outside the digestive tract. Crohn disease is closely related to a disease called ulcerative colitis (together, these two diseases are called inflammatory bowel disease).  CAUSES  The cause of Crohn disease is not known. [employer] is that, in an easily affected person, the immune system is triggered to attack the body's own digestive tissue. Crohn disease runs in families. It seems to be more common in certain geographic areas and amongst certain races. There are no clear-cut dietary causes.  SYMPTOMS  Crohn disease can cause many different symptoms since it can affect many  different parts of the body. Symptoms include:  Fatigue.  Weight loss.  Chronic diarrhea, sometime bloody.  Abdominal pain and cramps.  Fever.  Ulcers or canker sores in the mouth or rectum.  Anemia (low red blood cells).  Arthritis, skin problems, and eye problems may occur. Complications of Crohn disease can include:  Series of holes (perforation) of the bowel.  Portions of the intestines sticking to each other (adhesions).  Obstruction of the bowel.  Fistula formation, typically in the rectal area but also in other areas. A fistula is an opening between the bowels and the outside, or between the bowels and another organ.  A painful crack in the mucous membrane of the anus (rectal fissure). DIAGNOSIS  Your caregiver may suspect Crohn disease based on your symptoms and an exam. Blood tests may confirm that there is a problem. You may be asked to submit a stool specimen for examination. X-rays and CT scans may be necessary. Ultimately, the diagnosis is usually made after a procedure that uses a flexible tube that is inserted via your mouth or your anus. This is done under sedation and is called either an upper endoscopy or colonoscopy. With these tests, the specialist can take tiny tissue samples and remove them from the inside of the bowel (biopsy). Examination of this biopsy tissue under a microscope can reveal Crohn disease as the cause of your symptoms. Due to the many different forms that Crohn disease can take, symptoms may be present for several years before a diagnosis is made. TREATMENT  Medications are often used to decrease inflammation and control the immune system. These include medicines related to aspirin, steroid medications, and newer and stronger medications to slow down the immune system. Some medications may be used as suppositories or enemas. A number of other medications are used or have been studied. Your caregiver will make specific recommendations. HOME CARE  INSTRUCTIONS   Symptoms such as diarrhea can be controlled with medications. Avoid foods that have a laxative effect such as fresh fruit, vegetables, and dairy products. During flare-ups, you can rest your bowel by refraining from solid foods. Drink clear liquids frequently during the day. (Electrolyte or rehydrating fluids are best. Your caregiver can help you with suggestions.) Drink often to prevent loss of body fluids (dehydration). When diarrhea has cleared, eat small meals and more frequently. Avoid food additives and stimulants such as caffeine (coffee, tea, or chocolate). Enzyme supplements may help if you develop intolerance to a sugar in dairy products (lactose). Ask your caregiver or dietitian about specific dietary instructions.  Try to maintain a positive attitude. Learn relaxation techniques such as self-hypnosis, mental imaging, and muscle relaxation.  If possible, avoid stresses which can aggravate your condition.  Exercise  regularly.  Follow your diet.  Always get plenty of rest. SEEK MEDICAL CARE IF:   Your symptoms fail to improve after a week or two of new treatment.  You experience continued weight loss.  You have ongoing cramps or loose bowels.  You develop a new skin rash, skin sores, or eye problems. SEEK IMMEDIATE MEDICAL CARE IF:   You have worsening of your symptoms or develop new symptoms.  You have a fever.  You develop bloody diarrhea.  You develop severe abdominal pain. MAKE SURE YOU:   Understand these instructions.  Will watch your condition.  Will get help right away if you are not doing well or get worse. Document Released: 06/13/2005 Document Revised: 01/18/2014 Document Reviewed: 05/12/2007 [facility] Patient Information 2015 [place], [place]. This information is not intended to replace advice given to you by your health care provider. Make sure you discuss any questions you have with your health care provider. Hypokalemia Hypokalemia means  that the amount of potassium in the blood is lower than normal.Potassium is a chemical, called an electrolyte, that helps regulate the amount of fluid in the body. It also stimulates muscle contraction and helps nerves function properly.Most of the body's potassium is inside of cells, and only a very small amount is in the blood. Because the amount in the blood is so small, minor changes can be life-threatening. CAUSES  Antibiotics.  Diarrhea or vomiting.  Using laxatives too much, which can cause diarrhea.  Chronic kidney disease.  Water pills (diuretics).  Eating disorders (bulimia).  Low magnesium level.  Sweating a lot. SIGNS AND SYMPTOMS  Weakness.  Constipation.  Fatigue.  Muscle cramps.  Mental confusion.  Skipped heartbeats or irregular heartbeat (palpitations).  Tingling or numbness. DIAGNOSIS  Your health care provider can diagnose hypokalemia with blood tests. In addition to checking your potassium level, your health care provider may also check other lab tests. TREATMENT Hypokalemia can be treated with potassium supplements taken by mouth or adjustments in your current medicines. If your potassium level is very low, you may need to get potassium through a vein (IV) and be monitored in the hospital. A diet high in potassium is also helpful. Foods high in potassium are:  Nuts, such as peanuts and pistachios.  Seeds, such as sunflower seeds and pumpkin seeds.  Peas, lentils, and lima beans.  Whole grain and bran cereals and breads.  Fresh fruit and vegetables, such as apricots, avocado, bananas, cantaloupe, kiwi, oranges, tomatoes, asparagus, and potatoes.  Orange and tomato juices.  Red meats.  Fruit yogurt. HOME CARE INSTRUCTIONS  Take all medicines as prescribed by your health care provider.  Maintain a healthy diet by including nutritious food, such as fruits, vegetables, nuts, whole grains, and lean meats.  If you are taking a laxative, be  sure to follow the directions on the label. SEEK MEDICAL CARE IF:  Your weakness gets worse.  You feel your heart pounding or racing.  You are vomiting or having diarrhea.  You are diabetic and having trouble keeping your blood glucose in the normal range. SEEK IMMEDIATE MEDICAL CARE IF:  You have chest pain, shortness of breath, or dizziness.  You are vomiting or having diarrhea for more than 2 days.  You faint. MAKE SURE YOU:   Understand these instructions.  Will watch your condition.  Will get help right away if you are not doing well or get worse. Document Released: 09/03/2005 Document Revised: 06/24/2013 Document Reviewed: 03/06/2013 [facility] Patient Information 2015 [place], [place]. This  information is not intended to replace advice given to you by your health care provider. Make sure you discuss any questions you have with your health care provider.

## 2015-05-27 LAB — VITAMIN D 25 HYDROXY (VIT D DEFICIENCY, FRACTURES): Vit D, 25-Hydroxy: 40.8 ng/mL (ref 30.0–100.0)

## 2015-05-27 LAB — VITAMIN B12: Vitamin B-12: 454 pg/mL (ref 180–914)

## 2015-05-31 LAB — URINE CULTURE

## 2015-07-19 ENCOUNTER — Ambulatory Visit: Payer: 59 | Admitting: Internal Medicine

## 2015-08-23 ENCOUNTER — Encounter: Payer: Self-pay | Admitting: Obstetrics and Gynecology

## 2015-08-23 ENCOUNTER — Ambulatory Visit (INDEPENDENT_AMBULATORY_CARE_PROVIDER_SITE_OTHER): Payer: 59 | Admitting: Obstetrics and Gynecology

## 2015-08-23 VITALS — BP 106/70 | HR 91 | Ht 64.0 in | Wt 132.8 lb

## 2015-08-23 DIAGNOSIS — Z01419 Encounter for gynecological examination (general) (routine) without abnormal findings: Secondary | ICD-10-CM | POA: Diagnosis not present

## 2015-08-23 DIAGNOSIS — N809 Endometriosis, unspecified: Secondary | ICD-10-CM | POA: Diagnosis not present

## 2015-08-23 MED ORDER — IBUPROFEN 800 MG PO TABS: 800.0000 mg | ORAL_TABLET | Freq: Three times a day (TID) | ORAL | Status: DC | PRN

## 2015-08-23 NOTE — Patient Instructions (Signed)
1.  Pap smear is done today. 2.  Continue self breast exam monthly. 3.  Healthy eating and exercise were encouraged. 4.  Motrin 800 mg 3 times a day  for Severe dysmenorrhea 5.  Minimize narcotic use as much as possible. 6.  Multivitamin/orPrenatal vitamin daily 7.  Return in 1 year for annual exam

## 2015-08-23 NOTE — Progress Notes (Signed)
Patient ID: Sarah Edwards, female   DOB: 04-14-1991, 24 y.o.   MRN: [id number] ANNUAL PREVENTATIVE CARE GYN  ENCOUNTER NOTE  Subjective:       Sarah Edwards is a 24 y.o. G45P1011 female here for a routine annual gynecologic exam.  Current complaints: 1.  Terrible cycle- really bad cramps- has endometriosis  The patient has been attempting to conceive, having discontinued birth control pills several months ago.  She is on a multivitamin.   Pelvic cramps are severe, to where she is taking occasional narcotics for pain relief.  She has been missing 3-5 days of work a month on her severe pelvic discomfort.  The patient does not desire hormonal intervention at this time for attempted relief of pelvic pain.   Gynecologic History Patient's last menstrual period was 08/12/2015 (approximate). Contraception: none currently attempting to conceive.  Obstetric History OB History  Gravida Para Term Preterm AB SAB TAB Ectopic Multiple Living  2 1 1  0 1 1 0 0 0 1    # Outcome Date GA Lbr Len/2nd Weight Sex Delivery Anes PTL Lv  2 Term 09/17/09   6 lb 12 oz (3.062 kg) F Vag-Spont   Y  1 SAB        N       Past Medical History  Diagnosis Date  . SAB (spontaneous abortion) 01/06/2014  . Endometriosis   . Painful menstrual periods   . Abnormal uterine bleeding (AUB)   . Anxiety   . Crohn disease [facility])     Past Surgical History  Procedure Laterality Date  . Colonoscopy N/A 2010 and 2011    crohns  . Esophagogastroduodenoscopy endoscopy N/A 2010    No current outpatient prescriptions on file prior to visit.   No current facility-administered medications on file prior to visit.    Allergies  Allergen Reactions  . Amoxicillin   . Azithromycin Other (See Comments)  . Sulfa Antibiotics Nausea And Vomiting    Constant vomiting  . Penicillins Rash    Social History   Social History  . Marital Status: Single    Spouse Name: N/A  . Number of Children: N/A  . Years of Education: N/A    Occupational History  . Not on file.   Social History Main Topics  . Smoking status: Former Smoker -- 0.50 packs/day    Types: Cigarettes    Quit date: 03/18/2015  . Smokeless tobacco: Never Used  . Alcohol Use: 0.0 oz/week    0 Standard drinks or equivalent per week     Comment: occas  . Drug Use: No  . Sexual Activity: Yes    Birth Control/ Protection: None   Other Topics Concern  . Not on file   Social History Narrative    Family History  Problem Relation Age of Onset  . Cancer Paternal Grandmother     breast   . Ovarian cancer Paternal Grandmother   . Cervical cancer Paternal Grandmother   . Lung cancer Paternal Grandfather   . Leukemia Maternal Grandfather   . Diabetes Mother   . Diabetes Father     The following portions of the patient's history were reviewed and updated as appropriate: allergies, current medications, past family history, past medical history, past social history, past surgical history and problem list.  Review of Systems ROS Review of Systems - General ROS: negative for - chills, fatigue, fever, hot flashes, night sweats, weight gain or weight loss Psychological ROS: negative for - anxiety, decreased libido,  depression, mood swings, physical abuse or sexual abuse Ophthalmic ROS: negative for - blurry vision, eye pain or loss of vision ENT ROS: negative for - headaches, hearing change, visual changes or vocal changes Allergy and Immunology ROS: negative for - hives, itchy/watery eyes or seasonal allergies Hematological and Lymphatic ROS: negative for - bleeding problems, bruising, swollen lymph nodes or weight loss Endocrine ROS: negative for - galactorrhea, hair pattern changes, hot flashes, malaise/lethargy, mood swings, palpitations, polydipsia/polyuria, skin changes, temperature intolerance or unexpected weight changes Breast ROS: negative for - new or changing breast lumps or nipple discharge Respiratory ROS: negative for - cough or  shortness of breath Cardiovascular ROS: negative for - chest pain, irregular heartbeat, palpitations or shortness of breath Gastrointestinal ROS: no , change in bowel habits, or black or bloody stools Genito-Urinary ROS: no dysuria, trouble voiding, or hematuria.  Positive scratch that POSITIVE-severe menstrual cramps Musculoskeletal ROS: negative for - joint pain or joint stiffness Neurological ROS: negative for - bowel and bladder control changes Dermatological ROS: negative for rash and skin lesion changes   Objective:   BP 106/70 mmHg  Pulse 91  Ht 5' 4"  (1.626 m)  Wt 132 lb 12.8 oz (60.238 kg)  BMI 22.78 kg/m2  LMP 08/12/2015 (Approximate) CONSTITUTIONAL: Well-developed, well-nourished female in no acute distress.  PSYCHIATRIC: Normal mood and affect. Normal behavior. Normal judgment and thought content. [place]: Alert and oriented to person, place, and time. Normal muscle tone coordination. No cranial nerve deficit noted. HENT:  Normocephalic, atraumatic, External right and left ear normal. Oropharynx is clear and moist EYES: Conjunctivae and EOM are normal. Pupils are equal, round, and reactive to light. No scleral icterus.  NECK: Normal range of motion, supple, no masses.  Normal thyroid.  SKIN: Skin is warm and dry. No rash noted. Not diaphoretic. No erythema. No pallor. CARDIOVASCULAR: Normal heart rate noted, regular rhythm, no murmur. RESPIRATORY: Clear to auscultation bilaterally. Effort and breath sounds normal, no problems with respiration noted. BREASTS: Symmetric in size. No masses, skin changes, nipple drainage, or lymphadenopathy. ABDOMEN: Soft, normal bowel sounds, no distention noted.  No tenderness, rebound or guarding.  BLADDER: Normal PELVIC:  External Genitalia: Normal  BUS: Normal  Vagina: Normal  Cervix: Normal; 1/4.  Cervical motion tenderness  Uterus: Normal; midplane, 1/4 tender, mobile  Adnexa: Normal  RV: External Exam NormaI  MUSCULOSKELETAL:  Normal range of motion. No tenderness.  No cyanosis, clubbing, or edema.  2+ distal pulses. LYMPHATIC: No Axillary, Supraclavicular, or Inguinal Adenopathy.    Assessment:   Annual gynecologic examination 24 y.o. Contraception: none. Currently attempting to conceive bmi 23 Severe dysmenorrhea secondary to endometriosis; not desiring hormonal therapy at this time  Plan:  Pap: g/c and Pap, Reflex if ASCUS Mammogram: Not Indicated Stool Guaiac Testing:  Not Indicated Labs: tsh vit d lipid a1c fbs Routine preventative health maintenance measures emphasized: Exercise/Diet/Weight control, Tobacco Warnings and Alcohol/Substance use risks Continue prenatal vitamins. Advil 800 mg 3 times a day as needed for severe dysmenorrhea Return to [place], CMA  Brayton Mars, MD  Note: This dictation was prepared with Dragon dictation along with smaller phrase technology. Any transcriptional errors that result from this process are unintentional.

## 2015-08-25 LAB — PAP IG, CT-NG, RFX HPV ASCU: PAP Smear Comment: 0

## 2015-09-01 ENCOUNTER — Ambulatory Visit (INDEPENDENT_AMBULATORY_CARE_PROVIDER_SITE_OTHER): Payer: 59 | Admitting: Obstetrics and Gynecology

## 2015-09-01 ENCOUNTER — Encounter: Payer: Self-pay | Admitting: Obstetrics and Gynecology

## 2015-09-01 VITALS — BP 105/65 | HR 89 | Ht 64.0 in | Wt 132.8 lb

## 2015-09-01 DIAGNOSIS — N809 Endometriosis, unspecified: Secondary | ICD-10-CM

## 2015-09-01 DIAGNOSIS — R102 Pelvic and perineal pain unspecified side: Secondary | ICD-10-CM

## 2015-09-01 DIAGNOSIS — N939 Abnormal uterine and vaginal bleeding, unspecified: Secondary | ICD-10-CM

## 2015-09-01 DIAGNOSIS — N9489 Other specified conditions associated with female genital organs and menstrual cycle: Secondary | ICD-10-CM | POA: Diagnosis not present

## 2015-09-01 LAB — POCT URINALYSIS DIPSTICK
Bilirubin, UA: 1
Glucose, UA: NEGATIVE
Ketones, UA: NEGATIVE
Leukocytes, UA: NEGATIVE
NITRITE UA: NEGATIVE
PH UA: 6
RBC UA: NEGATIVE
Spec Grav, UA: >=1.03
UROBILINOGEN UA: 0.2

## 2015-09-01 NOTE — Progress Notes (Signed)
Chief complaint:     GYN ENCOUNTER NOTE  Subjective:       Sarah Edwards is a 24 y.o. G74P1011 female is here for gynecologic evaluation of the following issues:  1   Abnormal uterine bleeding 1 day. 2.  Pelvic pressure symptoms, cannot rule out UTI   Patient has been attempting to conceive over the past several months.  Her cycles have been regular on a monthly basis.  However, this month.  She had sudden onset of bleeding, painless, for a total of 1 day yesterday, which is approximately day 21 of her cycle.  Gynecologic History Patient's last menstrual period was 08/12/2015 (approximate). Contraception:None  Obstetric History OB History  Gravida Para Term Preterm AB SAB TAB Ectopic Multiple Living  2 1 1  0 1 1 0 0 0 1    # Outcome Date GA Lbr Len/2nd Weight Sex Delivery Anes PTL Lv  2 Term 09/17/09   6 lb 12 oz (3.062 kg) F Vag-Spont   Y  1 SAB        N       Past Medical History  Diagnosis Date  . SAB (spontaneous abortion) 01/06/2014  . Endometriosis   . Painful menstrual periods   . Abnormal uterine bleeding (AUB)   . Anxiety   . Crohn disease [facility])     Past Surgical History  Procedure Laterality Date  . Colonoscopy N/A 2010 and 2011    crohns  . Esophagogastroduodenoscopy endoscopy N/A 2010    Current Outpatient Prescriptions on File Prior to Visit  Medication Sig Dispense Refill  . ibuprofen (ADVIL,MOTRIN) 800 MG tablet Take 1 tablet (800 mg total) by mouth every 8 (eight) hours as needed. 60 tablet 1   No current facility-administered medications on file prior to visit.    Allergies  Allergen Reactions  . Amoxicillin   . Azithromycin Other (See Comments)  . Sulfa Antibiotics Nausea And Vomiting    Constant vomiting  . Penicillins Rash    Social History   Social History  . Marital Status: Single    Spouse Name: N/A  . Number of Children: N/A  . Years of Education: N/A   Occupational History  . Not on file.   Social History Main Topics   . Smoking status: Former Smoker -- 0.50 packs/day    Types: Cigarettes    Quit date: 03/18/2015  . Smokeless tobacco: Never Used  . Alcohol Use: 0.0 oz/week    0 Standard drinks or equivalent per week     Comment: occas  . Drug Use: No  . Sexual Activity: Yes    Birth Control/ Protection: None   Other Topics Concern  . Not on file   Social History Narrative    Family History  Problem Relation Age of Onset  . Cancer Paternal Grandmother     breast   . Ovarian cancer Paternal Grandmother   . Cervical cancer Paternal Grandmother   . Lung cancer Paternal Grandfather   . Leukemia Maternal Grandfather   . Diabetes Mother   . Diabetes Father     The following portions of the patient's history were reviewed and updated as appropriate: allergies, current medications, past family history, past medical history, past social history, past surgical history and problem list.  Review of Systems Review of Systems - General ROS: negative for - chills, fatigue, fever, hot flashes, malaise or night sweats Hematological and Lymphatic ROS: negative for - bleeding problems or swollen lymph nodes Gastrointestinal ROS: negative for -  abdominal pain, blood in stools, change in bowel habits and nausea/vomiting Musculoskeletal ROS: negative for - joint pain, muscle pain or muscular weakness Genito-Urinary ROS: negative for - dysmenorrhea, dyspareunia, dysuria, genital discharge, genital ulcers, hematuria, incontinence, irregular/heavy menses, nocturia.  POSITIVE-change in menstrual cycle; Pelvic pressure  Objective:   BP 105/65 mmHg  Pulse 89  Ht 5' 4"  (1.626 m)  Wt 132 lb 12.8 oz (60.238 kg)  BMI 22.78 kg/m2  LMP 08/12/2015 (Approximate) CONSTITUTIONAL: Well-developed, well-nourished female in no acute distress.  HENT:  Normocephalic, atraumatic.  NECK: Normal range of motion, supple, no masses.  Normal thyroid.  SKIN: Skin is warm and dry. No rash noted. Not diaphoretic. No erythema. No  pallor. [place]: Alert and oriented to person, place, and time. PSYCHIATRIC: Normal mood and affect. Normal behavior. Normal judgment and thought content. CARDIOVASCULAR:Not Examined RESPIRATORY: Not Examined BREASTS: Not Examined ABDOMEN: Soft, non distended; Non tender.  No Organomegaly. PELVIC:  External Genitalia: Normal  BUS: Normal  Vagina: Normal; Minimal old blood-brown in vaginal vault  Cervix: Normal;; No cervical motion tenderness  Uterus: Normal size, shape,consistency, mobile  Adnexa: Normal  RV: Normal   Bladder: Nontender MUSCULOSKELETAL: Normal range of motion. No tenderness.  No cyanosis, clubbing, or edema.     Assessment:   1. Abnormal uterine bleeding Unclear etiology - hCG, quantitative, pregnancy - POCT urinalysis dipstick  2. Pelvic pressure in female Cannot rule out UTI - POCT urinalysis dipstick - Urine culture  3. Endometriosis Stable     Plan:   1.  Quantitative serum hCG 2.  Urinalysis with culture. 3.  Reassurance of normal pelvic exam 4.  Follow up as needed  Brayton Mars, MD  Note: This dictation was prepared with Dragon dictation along with smaller phrase technology. Any transcriptional errors that result from this process are unintentional.

## 2015-09-01 NOTE — Patient Instructions (Signed)
1.  Urinalysis today to rule out UTI. 2.  Serum quantitative hCG to rule out pregnancy.  This will be done along with other labs that are due today. 3.  Follow-up aded.

## 2015-09-02 LAB — LIPID PANEL
CHOLESTEROL TOTAL: 143 mg/dL (ref 100–199)
Chol/HDL Ratio: 2.2 ratio units (ref 0.0–4.4)
HDL: 65 mg/dL (ref >39)
LDL CALC: 70 mg/dL (ref 0–99)
TRIGLYCERIDES: 42 mg/dL (ref 0–149)
VLDL Cholesterol Cal: 8 mg/dL (ref 5–40)

## 2015-09-02 LAB — HEMOGLOBIN A1C
ESTIMATED AVERAGE GLUCOSE: 114 mg/dL
Hgb A1c MFr Bld: 5.6 % (ref 4.8–5.6)

## 2015-09-02 LAB — GLUCOSE, RANDOM: GLUCOSE: 85 mg/dL (ref 65–99)

## 2015-09-02 LAB — TSH: TSH: 2.55 u[IU]/mL (ref 0.450–4.500)

## 2015-09-02 LAB — VITAMIN D 25 HYDROXY (VIT D DEFICIENCY, FRACTURES): Vit D, 25-Hydroxy: 35.9 ng/mL (ref 30.0–100.0)

## 2015-09-02 LAB — URINE CULTURE: Organism ID, Bacteria: NO GROWTH

## 2016-01-12 ENCOUNTER — Encounter: Payer: Self-pay | Admitting: Gastroenterology

## 2016-01-12 ENCOUNTER — Ambulatory Visit (INDEPENDENT_AMBULATORY_CARE_PROVIDER_SITE_OTHER): Payer: 59 | Admitting: Gastroenterology

## 2016-01-12 VITALS — BP 128/66 | HR 112 | Temp 97.5°F | Ht 64.0 in | Wt 141.0 lb

## 2016-01-12 DIAGNOSIS — F39 Unspecified mood [affective] disorder: Secondary | ICD-10-CM | POA: Insufficient documentation

## 2016-01-12 DIAGNOSIS — K580 Irritable bowel syndrome with diarrhea: Secondary | ICD-10-CM

## 2016-01-12 DIAGNOSIS — K219 Gastro-esophageal reflux disease without esophagitis: Secondary | ICD-10-CM | POA: Insufficient documentation

## 2016-01-12 DIAGNOSIS — M62838 Other muscle spasm: Secondary | ICD-10-CM | POA: Insufficient documentation

## 2016-01-12 MED ORDER — DICYCLOMINE HCL 10 MG PO CAPS: 10.0000 mg | ORAL_CAPSULE | Freq: Three times a day (TID) | ORAL | Status: DC

## 2016-01-12 NOTE — Progress Notes (Signed)
Gastroenterology Consultation  Referring Provider:     Glean Hess, MD Primary Care Physician:  Halina Maidens, MD Primary Gastroenterologist:  Dr. Allen Norris     Reason for Consultation:     Diarrhea        HPI:   Sarah Edwards is a 25 y.o. y/o female referred for consultation & management of  diarrhea by Dr. Halina Maidens, MD.   This patient comes today with a long standing history of diarrhea. The patient reports that she has to run to the bathroom shortly after eating. She states this happens with all of her meals. She has been woken up at night due to abdominal pain which results in her having to go to the bathroom. The patient had imaging some time ago that showed some thickening of her colon. The patient was followed by Dr. Candace Cruise and had a colonoscopy times two and an upper endoscopy. The patient reports that she no longer wants to follow up with him and reports that she has never been told why she is having the symptoms. There is no report of any unexplained weight loss and the patient states that she has been gaining weight. There is no report of any black stools or vomiting. The patient does report that she has chronic nausea. The patient states that she will also get abdominal pain and feel sick if she eats too much Dairy products.  Past Medical History  Diagnosis Date  . SAB (spontaneous abortion) 01/06/2014  . Endometriosis   . Painful menstrual periods   . Abnormal uterine bleeding (AUB)   . Anxiety   . Crohn disease [facility])     Past Surgical History  Procedure Laterality Date  . Colonoscopy N/A 2010 and 2011    crohns  . Esophagogastroduodenoscopy endoscopy N/A 2010    Prior to Admission medications   Medication Sig Start Date End Date Taking? Authorizing Provider  ibuprofen (ADVIL,MOTRIN) 800 MG tablet Take 1 tablet (800 mg total) by mouth every 8 (eight) hours as needed. 08/23/15  Yes Alanda Slim Defrancesco, MD  dicyclomine (BENTYL) 10 MG capsule Take 1 capsule (10 mg  total) by mouth 4 (four) times daily -  before meals and at bedtime. 01/12/16   Lucilla Lame, MD    Family History  Problem Relation Age of Onset  . Cancer Paternal Grandmother     breast   . Ovarian cancer Paternal Grandmother   . Cervical cancer Paternal Grandmother   . Lung cancer Paternal Grandfather   . Leukemia Maternal Grandfather   . Diabetes Mother   . Diabetes Father      Social History  Substance Use Topics  . Smoking status: Former Smoker -- 0.50 packs/day    Types: Cigarettes    Quit date: 03/18/2015  . Smokeless tobacco: Never Used  . Alcohol Use: 0.0 oz/week    0 Standard drinks or equivalent per week     Comment: occas    Allergies as of 01/12/2016 - Review Complete 01/12/2016  Allergen Reaction Noted  . Amoxicillin  08/23/2015  . Azithromycin Other (See Comments) 08/23/2015  . Sulfa antibiotics Nausea And Vomiting 01/07/2015  . Penicillins Rash 01/07/2015    Review of Systems:    All systems reviewed and negative except where noted in HPI.   Physical Exam:  BP 128/66 mmHg  Pulse 112  Temp(Src) 97.5 F (36.4 C) (Oral)  Ht 5' 4"  (1.626 m)  Wt 141 lb (63.957 kg)  BMI 24.19 kg/m2 No LMP  recorded. Psych:  Alert and cooperative. Normal mood and affect. General:   Alert,  Well-developed, well-nourished, pleasant and cooperative in NAD Head:  Normocephalic and atraumatic. Eyes:  Sclera clear, no icterus.   Conjunctiva pink. Ears:  Normal auditory acuity. Nose:  No deformity, discharge, or lesions. Mouth:  No deformity or lesions,oropharynx pink & moist. Neck:  Supple; no masses or thyromegaly. Lungs:  Respirations even and unlabored.  Clear throughout to auscultation.   No wheezes, crackles, or rhonchi. No acute distress. Heart:  Regular rate and rhythm; no murmurs, clicks, rubs, or gallops. Abdomen:  Normal bowel sounds.  No bruits.  Soft, non-tender and non-distended without masses, hepatosplenomegaly or hernias noted.  No guarding or rebound  tenderness.  Negative Carnett sign.   Rectal:  Deferred.  Msk:  Symmetrical without gross deformities.  Good, equal movement & strength bilaterally. Pulses:  Normal pulses noted. Extremities:  No clubbing or edema.  No cyanosis. Neurologic:  Alert and oriented x3;  grossly normal neurologically. Skin:  Intact without significant lesions or rashes.  No jaundice. Lymph Nodes:  No significant cervical adenopathy. Psych:  Alert and cooperative. Normal mood and affect.  Imaging Studies: No results found.  Assessment and Plan:   Sarah Edwards is a 25 y.o. y/o female  Who comes in today with symptoms consistent with irritable bowel syndrome. The patient has had an upper endoscopy and to colonoscopies in the past by Dr. Candace Cruise. The patient will be started on a trial of dicyclomine 10 mg three times a day. She will also avoided milk products. The patient will contact me if her symptoms do  Improve.   Note: This dictation was prepared with Dragon dictation along with smaller phrase technology. Any transcriptional errors that result from this process are unintentional.

## 2016-01-13 DIAGNOSIS — K580 Irritable bowel syndrome with diarrhea: Secondary | ICD-10-CM | POA: Diagnosis not present

## 2016-01-14 LAB — C-REACTIVE PROTEIN: CRP: 0.7 mg/L (ref 0.0–4.9)

## 2016-01-14 LAB — BETA HCG QUANT (REF LAB)

## 2016-01-16 ENCOUNTER — Telehealth: Payer: Self-pay | Admitting: Gastroenterology

## 2016-01-16 NOTE — Telephone Encounter (Signed)
Are results in?

## 2016-01-17 NOTE — Telephone Encounter (Signed)
Pt notified of lab results

## 2016-01-17 NOTE — Telephone Encounter (Signed)
-----   Message from Lucilla Lame, MD sent at 01/16/2016  1:15 PM EDT ----- Let the patient know that the blood test for inflammation was negative for any active inflammation and the pregnancy test was also negative.

## 2016-01-24 ENCOUNTER — Encounter: Payer: Self-pay | Admitting: Obstetrics and Gynecology

## 2016-01-24 ENCOUNTER — Ambulatory Visit (INDEPENDENT_AMBULATORY_CARE_PROVIDER_SITE_OTHER): Payer: 59 | Admitting: Obstetrics and Gynecology

## 2016-01-24 VITALS — BP 114/72 | HR 101 | Ht 64.0 in | Wt 140.5 lb

## 2016-01-24 DIAGNOSIS — N809 Endometriosis, unspecified: Secondary | ICD-10-CM | POA: Diagnosis not present

## 2016-01-24 DIAGNOSIS — F419 Anxiety disorder, unspecified: Secondary | ICD-10-CM | POA: Diagnosis not present

## 2016-01-24 DIAGNOSIS — N912 Amenorrhea, unspecified: Secondary | ICD-10-CM | POA: Diagnosis not present

## 2016-01-24 LAB — POCT URINE PREGNANCY: PREG TEST UR: NEGATIVE

## 2016-01-24 NOTE — Patient Instructions (Signed)
1. Discussed timed intercourse around ovulation to ensure adequate exposure to ejaculate. 2. Try to decrease stress and anxiety around conceiving. 3. Continue with current measures such as avoidance of tobacco and minimization of alcohol, taking prenatal vitamins, healthy eating and exercise.

## 2016-01-24 NOTE — Progress Notes (Signed)
Patient ID: Sarah Edwards, female   DOB: October 19, 1990, 25 y.o.   MRN: [id number] GYN ENCOUNTER NOTE  Subjective:       Sarah Edwards is a 25 y.o. G70P1011 female is here for gynecologic evaluation of the following issues:  1. Difficulty conceiving. Seen in office 6 months ago for annual, and has been trying to conceive since then. Taking prenatal vitamins, and changed diet. Has been avoiding tobacco and alcohol. 2. Amenorrhea. LMP was 12/17/2015, no premenstrual signs at this time which patient usually experiences. Cycles have been regular. Negative pregnancy test at GI doctors office on 01/12/16. Patient has experienced some nausea and indigestion with 2 episodes of emesis in the last 2 weeks, increased urinary frequency, and constipation x2 weeks. Patient took dulcolax for constipation and had 2 BM this morning. Patient is very anxious and stressed over wanting to conceive.   Gynecologic History Patient's last menstrual period was 12/17/2015 (approximate). Contraception: none Last Pap: 08/19/2014. Results were: normal Last mammogram: NA. Results were: NA  Obstetric History OB History  Gravida Para Term Preterm AB SAB TAB Ectopic Multiple Living  2 1 1  0 1 1 0 0 0 1    # Outcome Date GA Lbr Len/2nd Weight Sex Delivery Anes PTL Lv  2 Term 09/17/09   6 lb 12 oz (3.062 kg) F Vag-Spont   Y  1 SAB        N       Past Medical History  Diagnosis Date  . SAB (spontaneous abortion) 01/06/2014  . Endometriosis   . Painful menstrual periods   . Abnormal uterine bleeding (AUB)   . Anxiety   . Crohn disease ([place])   . IBS (irritable bowel syndrome)     Past Surgical History  Procedure Laterality Date  . Colonoscopy N/A 2010 and 2011    crohns  . Esophagogastroduodenoscopy endoscopy N/A 2010    No current outpatient prescriptions on file prior to visit.   No current facility-administered medications on file prior to visit.    Allergies  Allergen Reactions  . Amoxicillin   .  Azithromycin Other (See Comments)  . Sulfa Antibiotics Nausea And Vomiting    Constant vomiting  . Penicillins Rash    Social History   Social History  . Marital Status: Single    Spouse Name: N/A  . Number of Children: N/A  . Years of Education: N/A   Occupational History  . Not on file.   Social History Main Topics  . Smoking status: Former Smoker -- 0.50 packs/day    Types: Cigarettes    Quit date: 03/18/2015  . Smokeless tobacco: Never Used  . Alcohol Use: 0.0 oz/week    0 Standard drinks or equivalent per week     Comment: occas  . Drug Use: No  . Sexual Activity: Yes    Birth Control/ Protection: None   Other Topics Concern  . Not on file   Social History Narrative    Family History  Problem Relation Age of Onset  . Cancer Paternal Grandmother     breast   . Ovarian cancer Paternal Grandmother   . Cervical cancer Paternal Grandmother   . Lung cancer Paternal Grandfather   . Leukemia Maternal Grandfather   . Diabetes Mother   . Diabetes Father     The following portions of the patient's history were reviewed and updated as appropriate: allergies, current medications, past family history, past medical history, past social history, past surgical history and problem  list.  Review of Systems Review of Systems - Negative except as reported in HPI   Objective:   BP 114/72 mmHg  Pulse 101  Ht 5' 4"  (1.626 m)  Wt 140 lb 8 oz (63.73 kg)  BMI 24.10 kg/m2  LMP 12/17/2015 (Approximate)  Exam deferred at present. Annual scheduled in December per patient.     Assessment:   1. Amenorrhea - POCT urine pregnancy  2. Endometriosis - stable  3. Anxiety     Plan:   1. Discussed timed intercourse around ovulation to ensure adequate exposure to ejaculate. 2. Try to decrease stress and anxiety around conceiving. 3. Continue with current measures such as avoidance of tobacco and minimization of alcohol, taking prenatal vitamins, healthy eating and  exercise. 4. Discussed with patient that infertility workup is not appropriate at this time, will consider at later date if it becomes appropriate.  A total of 15 minutes were spent face-to-face with the patient during this encounter and over half of that time dealt with counseling and coordination of care.   Claiborne Billings Rayburn PA-S Brayton Mars, MD    I have seen, interviewed, and examined the patient in conjunction with the [facility]A. student and affirm the diagnosis and management plan. Martin A. DeFrancesco, MD, FACOG   Note: This dictation was prepared with Dragon dictation along with smaller phrase technology. Any transcriptional errors that result from this process are unintentional.

## 2016-03-19 ENCOUNTER — Telehealth: Payer: 59 | Admitting: Family

## 2016-03-19 DIAGNOSIS — B349 Viral infection, unspecified: Secondary | ICD-10-CM

## 2016-03-19 DIAGNOSIS — B9789 Other viral agents as the cause of diseases classified elsewhere: Secondary | ICD-10-CM

## 2016-03-19 DIAGNOSIS — J329 Chronic sinusitis, unspecified: Secondary | ICD-10-CM | POA: Diagnosis not present

## 2016-03-19 MED ORDER — FLUTICASONE PROPIONATE 50 MCG/ACT NA SUSP: 2.0000 | Freq: Every day | NASAL | Status: DC

## 2016-03-19 NOTE — Progress Notes (Signed)
We are sorry that you are not feeling well.  Here is how we plan to help!  Based on what you have shared with me it looks like you have sinusitis.  Sinusitis is inflammation and infection in the sinus cavities of the head.  Based on your presentation I believe you most likely have Acute Viral Sinusitis.This is an infection most likely caused by a virus. There is not specific treatment for viral sinusitis other than to help you with the symptoms until the infection runs its course.  You may use an oral decongestant such as Mucinex D or if you have glaucoma or high blood pressure use plain Mucinex. Saline nasal spray help and can safely be used as often as needed for congestion, I have prescribed: Fluticasone nasal spray two sprays in each nostril twice a day  Some authorities believe that zinc sprays or the use of Echinacea may shorten the course of your symptoms.  Sinus infections are not as easily transmitted as other respiratory infection, however we still recommend that you avoid close contact with loved ones, especially the very young and elderly.  Remember to wash your hands thoroughly throughout the day as this is the number one way to prevent the spread of infection!  Home Care:  Only take medications as instructed by your medical team.  Complete the entire course of an antibiotic.  Do not take these medications with alcohol.  A steam or ultrasonic humidifier can help congestion.  You can place a towel over your head and breathe in the steam from hot water coming from a faucet.  Avoid close contacts especially the very young and the elderly.  Cover your mouth when you cough or sneeze.  Always remember to wash your hands.  Get Help Right Away If:  You develop worsening fever or sinus pain.  You develop a severe head ache or visual changes.  Your symptoms persist after you have completed your treatment plan.  Make sure you  Understand these instructions.  Will watch your  condition.  Will get help right away if you are not doing well or get worse.  Your e-visit answers were reviewed by a board certified advanced clinical practitioner to complete your personal care plan.  Depending on the condition, your plan could have included both over the counter or prescription medications.  If there is a problem please reply  once you have received a response from your provider.  Your safety is important to [place].  If you have drug allergies check your prescription carefully.    You can use MyChart to ask questions about today's visit, request a non-urgent call back, or ask for a work or school excuse for 24 hours related to this e-Visit. If it has been greater than 24 hours you will need to follow up with your provider, or enter a new e-Visit to address those concerns.  You will get an e-mail in the next two days asking about your experience.  I hope that your e-visit has been valuable and will speed your recovery. Thank you for using e-visits.

## 2016-03-20 NOTE — Progress Notes (Signed)
WORK NOTE COMPLETED

## 2016-04-12 ENCOUNTER — Ambulatory Visit: Payer: 59 | Admitting: Obstetrics and Gynecology

## 2016-05-08 ENCOUNTER — Telehealth: Payer: 59 | Admitting: Family

## 2016-05-08 DIAGNOSIS — A499 Bacterial infection, unspecified: Secondary | ICD-10-CM

## 2016-05-08 DIAGNOSIS — N390 Urinary tract infection, site not specified: Secondary | ICD-10-CM

## 2016-05-08 MED ORDER — NITROFURANTOIN MONOHYD MACRO 100 MG PO CAPS: 100.0000 mg | ORAL_CAPSULE | Freq: Two times a day (BID) | ORAL | 0 refills | Status: DC

## 2016-05-08 NOTE — Progress Notes (Signed)

## 2016-05-09 ENCOUNTER — Encounter: Payer: Self-pay | Admitting: Obstetrics and Gynecology

## 2016-05-09 ENCOUNTER — Ambulatory Visit: Payer: 59 | Admitting: Obstetrics and Gynecology

## 2016-05-09 ENCOUNTER — Ambulatory Visit (INDEPENDENT_AMBULATORY_CARE_PROVIDER_SITE_OTHER): Payer: 59 | Admitting: Obstetrics and Gynecology

## 2016-05-09 VITALS — BP 100/64 | HR 87 | Ht 64.0 in | Wt 139.4 lb

## 2016-05-09 DIAGNOSIS — Z30011 Encounter for initial prescription of contraceptive pills: Secondary | ICD-10-CM

## 2016-05-09 DIAGNOSIS — H5203 Hypermetropia, bilateral: Secondary | ICD-10-CM | POA: Diagnosis not present

## 2016-05-09 LAB — POCT URINE PREGNANCY: PREG TEST UR: NEGATIVE

## 2016-05-09 NOTE — Progress Notes (Signed)
Chief complaint: 1. Contraceptive management  Patient presents for start oral contraceptives. Previously she was on low Loestrin with success. She also was on Ortho Tri-Cyclen with success. She is now with a new partner in a stable long-term friendship x 16 years. Previous partner of 3.5 years without use of contraception led to nopregnancy. She does not wish to conceive in the very near future; desire is to let this current relationship mature first.  Past Medical History:  Diagnosis Date  . Abnormal uterine bleeding (AUB)   . Anxiety   . Chronic UTI   . Crohn disease ([place])   . Endometriosis   . IBS (irritable bowel syndrome)   . Painful menstrual periods   . SAB (spontaneous abortion) 01/06/2014   Past Surgical History:  Procedure Laterality Date  . COLONOSCOPY N/A 2010 and 2011   crohns  . ESOPHAGOGASTRODUODENOSCOPY ENDOSCOPY N/A 2010     OBJECTIVE: BP 100/64   Pulse 87   Ht 5' 4"  (1.626 m)   Wt 139 lb 6.4 oz (63.2 kg)   LMP 04/24/2016   BMI 23.93 kg/m  Physical exam-deferred  ASSESSMENT: 1. Contraceptive management-desire to start OCPs  PLAN: 1. UPT-negative 2. Begin Lo Loestrin oral contraceptive 3. Return in 6 months for annual exam  A total of 15 minutes were spent face-to-face with the patient during this encounter and over half of that time dealt with counseling and coordination of care.  Brayton Mars, MD  Note: This dictation was prepared with Dragon dictation along with smaller phrase technology. Any transcriptional errors that result from this process are unintentional.

## 2016-05-09 NOTE — Patient Instructions (Signed)
1. Urine pregnancy test is negative  2. Start low Loestrin oral contraceptive  3. Return in December 2017 for annual exam

## 2016-06-08 ENCOUNTER — Encounter: Payer: Self-pay | Admitting: Internal Medicine

## 2016-06-08 ENCOUNTER — Ambulatory Visit (INDEPENDENT_AMBULATORY_CARE_PROVIDER_SITE_OTHER): Payer: 59 | Admitting: Internal Medicine

## 2016-06-08 VITALS — BP 108/78 | HR 84 | Resp 16 | Ht 64.0 in | Wt 140.0 lb

## 2016-06-08 DIAGNOSIS — M5417 Radiculopathy, lumbosacral region: Secondary | ICD-10-CM

## 2016-06-08 DIAGNOSIS — M5416 Radiculopathy, lumbar region: Secondary | ICD-10-CM

## 2016-06-08 NOTE — Progress Notes (Signed)
Date:  06/08/2016   Name:  Sarah Edwards   DOB:  [birth year]   MRN:  [id number]   Chief Complaint: Numbness (Left side numbness on face and ankle and shin x several months ) HPI Patient reports a 4 year history of intermittent low back pain and left leg numbness. It seemed to start with an epidural block for labor and delivery. He sometimes has similar symptoms left arm. Yesterday she had transient numbness in the left side of her face. She has decreased sensation on her left lateral leg at all times. This is worsened by sexual intercourse causing more numbness of the entire leg with some discomfort. Last year was seen at [facility] - had plain films of entire spine which were normal.  Labs were also normal.  She is ready to have more extensive evaluation.  Review of Systems  Constitutional: Negative for chills, fatigue and fever.  Respiratory: Negative for chest tightness and shortness of breath.   Cardiovascular: Negative for chest pain and leg swelling.  Gastrointestinal: Negative for abdominal pain and rectal pain.       No fecal incontinence  Genitourinary: Negative for dyspareunia.  Musculoskeletal: Positive for arthralgias and back pain.  Skin: Negative for rash.  Neurological: Positive for weakness and numbness. Negative for dizziness and headaches.  Psychiatric/Behavioral: Negative for sleep disturbance.    Patient Active Problem List   Diagnosis Date Noted  . Acid reflux 01/12/2016  . Episodic mood disorder ([place]) 01/12/2016  . Muscle spasms of neck 01/12/2016  . Crohn disease ([place]) 01/07/2015  . Endometriosis 01/07/2015  . Tobacco user 01/07/2015  . Anxiety 01/07/2015  . Abnormal uterine bleeding 01/07/2015  . SAB (spontaneous abortion) 01/07/2015  . Painful menstrual periods 01/07/2015    Prior to Admission medications   Not on File    Allergies  Allergen Reactions  . Amoxicillin   . Azithromycin Other (See Comments)  . Sulfa Antibiotics Nausea And Vomiting   Constant vomiting  . Penicillins Rash    Past Surgical History:  Procedure Laterality Date  . COLONOSCOPY N/A 2010 and 2011   crohns  . ESOPHAGOGASTRODUODENOSCOPY ENDOSCOPY N/A 2010    Social History  Substance Use Topics  . Smoking status: Former Smoker    Packs/day: 0.50    Types: Cigarettes    Quit date: 03/18/2015  . Smokeless tobacco: Never Used  . Alcohol use 0.0 oz/week     Comment: occas     Medication list has been reviewed and updated.   Physical Exam  Constitutional: She is oriented to person, place, and time. She appears well-developed. No distress.  HENT:  Head: Normocephalic and atraumatic.  Neck: Normal range of motion. Neck supple.  Cardiovascular: Normal rate, regular rhythm and normal heart sounds.   Pulmonary/Chest: Effort normal and breath sounds normal. No respiratory distress.  Abdominal: Soft. Bowel sounds are normal. There is no tenderness.  Musculoskeletal: Normal range of motion.       Lumbar back: She exhibits tenderness. She exhibits no spasm.  TMJ click on left with good excursion  SLR positive on left; negative on right  Neurological: She is alert and oriented to person, place, and time. A sensory deficit (decreased sensation along left lateral leg) is present. Coordination and gait normal.  Reflex Scores:      Bicep reflexes are 1+ on the right side and 1+ on the left side.      Patellar reflexes are 2+ on the right side and 2+ on the left  side.      Achilles reflexes are 1+ on the right side and 1+ on the left side. Skin: Skin is warm, dry and intact. No rash noted.  Psychiatric: She has a normal mood and affect. Her behavior is normal. Thought content normal.  Nursing note and vitals reviewed.   BP 108/78   Pulse 84   Resp 16   Ht 5' 4"  (1.626 m)   Wt 140 lb (63.5 kg)   LMP 05/17/2016 (Exact Date) Comment: 2 days  SpO2 100%   BMI 24.03 kg/m   Assessment and Plan: 1. Lumbar back pain with radiculopathy affecting left lower  extremity - Ambulatory referral to [place], [facility]  06/08/2016

## 2016-06-12 ENCOUNTER — Ambulatory Visit: Payer: 59 | Admitting: Internal Medicine

## 2016-06-15 ENCOUNTER — Ambulatory Visit: Payer: 59 | Admitting: Internal Medicine

## 2016-06-15 DIAGNOSIS — M5412 Radiculopathy, cervical region: Secondary | ICD-10-CM | POA: Diagnosis not present

## 2016-06-15 DIAGNOSIS — M5442 Lumbago with sciatica, left side: Secondary | ICD-10-CM | POA: Diagnosis not present

## 2016-06-15 DIAGNOSIS — R200 Anesthesia of skin: Secondary | ICD-10-CM | POA: Diagnosis not present

## 2016-06-15 DIAGNOSIS — R202 Paresthesia of skin: Secondary | ICD-10-CM | POA: Diagnosis not present

## 2016-07-04 DIAGNOSIS — R200 Anesthesia of skin: Secondary | ICD-10-CM | POA: Insufficient documentation

## 2016-07-04 DIAGNOSIS — R531 Weakness: Secondary | ICD-10-CM | POA: Insufficient documentation

## 2016-07-09 ENCOUNTER — Other Ambulatory Visit: Payer: Self-pay | Admitting: Neurology

## 2016-07-09 DIAGNOSIS — R531 Weakness: Secondary | ICD-10-CM

## 2016-07-09 DIAGNOSIS — R200 Anesthesia of skin: Secondary | ICD-10-CM

## 2016-07-20 ENCOUNTER — Ambulatory Visit: Payer: 59

## 2016-07-20 ENCOUNTER — Ambulatory Visit
Admission: RE | Admit: 2016-07-20 | Discharge: 2016-07-20 | Disposition: A | Discharge status: Home / Self Care | Payer: 59 | Source: Ambulatory Visit | Attending: Neurology | Admitting: Neurology

## 2016-07-20 DIAGNOSIS — R531 Weakness: Secondary | ICD-10-CM | POA: Insufficient documentation

## 2016-07-20 DIAGNOSIS — R200 Anesthesia of skin: Secondary | ICD-10-CM | POA: Insufficient documentation

## 2016-07-20 DIAGNOSIS — R51 Headache: Secondary | ICD-10-CM | POA: Diagnosis not present

## 2016-07-27 ENCOUNTER — Ambulatory Visit: Payer: 59

## 2016-07-30 DIAGNOSIS — R531 Weakness: Secondary | ICD-10-CM | POA: Diagnosis not present

## 2016-07-30 DIAGNOSIS — M542 Cervicalgia: Secondary | ICD-10-CM | POA: Diagnosis not present

## 2016-07-30 DIAGNOSIS — F447 Conversion disorder with mixed symptom presentation: Secondary | ICD-10-CM | POA: Diagnosis not present

## 2016-07-30 DIAGNOSIS — R200 Anesthesia of skin: Secondary | ICD-10-CM | POA: Diagnosis not present

## 2016-08-01 ENCOUNTER — Other Ambulatory Visit: Payer: Self-pay | Admitting: Neurology

## 2016-08-01 DIAGNOSIS — R200 Anesthesia of skin: Secondary | ICD-10-CM

## 2016-08-01 DIAGNOSIS — R531 Weakness: Secondary | ICD-10-CM

## 2016-08-01 DIAGNOSIS — M542 Cervicalgia: Secondary | ICD-10-CM

## 2016-08-03 ENCOUNTER — Ambulatory Visit (INDEPENDENT_AMBULATORY_CARE_PROVIDER_SITE_OTHER): Payer: 59 | Admitting: Internal Medicine

## 2016-08-03 ENCOUNTER — Other Ambulatory Visit: Payer: Self-pay | Admitting: Internal Medicine

## 2016-08-03 ENCOUNTER — Encounter: Payer: Self-pay | Admitting: Internal Medicine

## 2016-08-03 VITALS — BP 122/80 | HR 99 | Resp 16 | Ht 64.0 in | Wt 140.6 lb

## 2016-08-03 DIAGNOSIS — Z3200 Encounter for pregnancy test, result unknown: Secondary | ICD-10-CM | POA: Diagnosis not present

## 2016-08-03 LAB — POCT URINE PREGNANCY: PREG TEST UR: POSITIVE — AB

## 2016-08-03 NOTE — Progress Notes (Signed)
Date:  08/03/2016   Name:  Sarah Edwards   DOB:  1990-11-15   MRN:  [id number]   Chief Complaint: Possible Pregnancy ( LMP 07/08/2016 Last Tanning bed 2 days ago and MRI done 07/20/2016 - Has appt coming up  OBGYN ) She did three home tests - all before noon yesterday and were positive.  She feels slightly nauseated and has decreased appetite but no vomiting. She is concerned because she has been drinking beer and had an MRI since her last menses.   Review of Systems  Constitutional: Positive for appetite change. Negative for fatigue, fever and unexpected weight change.  Respiratory: Negative for chest tightness, shortness of breath and wheezing.   Cardiovascular: Negative for chest pain, palpitations and leg swelling.  Gastrointestinal: Positive for nausea. Negative for abdominal pain, blood in stool, constipation and vomiting.    Patient Active Problem List   Diagnosis Date Noted  . Left sided numbness 07/04/2016  . Left-sided weakness 07/04/2016  . Acid reflux 01/12/2016  . Episodic mood disorder ([place]) 01/12/2016  . Muscle spasms of neck 01/12/2016  . Crohn disease ([place]) 01/07/2015  . Endometriosis 01/07/2015  . Tobacco user 01/07/2015  . Anxiety 01/07/2015  . Abnormal uterine bleeding 01/07/2015  . SAB (spontaneous abortion) 01/07/2015  . Painful menstrual periods 01/07/2015    Prior to Admission medications   Medication Sig Start Date End Date Taking? Authorizing Provider     08/14/16  Historical Provider, MD       Historical Provider, MD    Allergies  Allergen Reactions  . Azithromycin Swelling  . Amoxicillin Rash  . Penicillins Rash  . Sulfa Antibiotics Nausea And Vomiting    Constant vomiting    Past Surgical History:  Procedure Laterality Date  . COLONOSCOPY N/A 2010 and 2011   crohns  . ESOPHAGOGASTRODUODENOSCOPY ENDOSCOPY N/A 2010    Social History  Substance Use Topics  . Smoking status: Former Smoker    Packs/day: 0.50    Types: Cigarettes      Quit date: 03/18/2015  . Smokeless tobacco: Never Used  . Alcohol use 0.0 oz/week     Comment: occas     Medication list has been reviewed and updated.   Physical Exam  Constitutional: She is oriented to person, place, and time. She appears well-developed. No distress.  HENT:  Head: Normocephalic and atraumatic.  Neck: Normal range of motion. Neck supple. No thyromegaly present.  Cardiovascular: Normal rate, regular rhythm and normal heart sounds.   Pulmonary/Chest: Effort normal and breath sounds normal. No respiratory distress. She has no wheezes.  Abdominal: Soft. Bowel sounds are normal. She exhibits no distension and no mass. There is no tenderness. There is no rebound and no guarding.  Musculoskeletal: She exhibits no edema or tenderness.  Neurological: She is alert and oriented to person, place, and time.  Skin: Skin is warm and dry. No rash noted.  Psychiatric: She has a normal mood and affect. Her behavior is normal. Thought content normal.  Nursing note and vitals reviewed.   BP 122/80   Pulse 99   Resp 16   Ht 5' 4"  (1.626 m)   Wt 140 lb 9.6 oz (63.8 kg)   LMP 07/06/2016   SpO2 98%   BMI 24.13 kg/m   Assessment and Plan: 1. Possible pregnancy, not confirmed Urine test appears faintly positive? So will get serum test Resume Prenatal vitamins - POCT urine pregnancy - hCG, serum, qualitative   Halina Maidens, MD [facility]  08/03/2016

## 2016-08-03 NOTE — Patient Instructions (Signed)
Check on Pre-natal vitamins with Folic acid.

## 2016-08-04 LAB — HCG, SERUM, QUALITATIVE: HCG, BETA SUBUNIT, QUAL, SERUM: POSITIVE m[IU]/mL — AB (ref <6)

## 2016-08-10 ENCOUNTER — Ambulatory Visit: Payer: 59

## 2016-08-20 ENCOUNTER — Telehealth: Payer: Self-pay | Admitting: Obstetrics and Gynecology

## 2016-08-20 MED ORDER — DOXYLAMINE-PYRIDOXINE 10-10 MG PO TBEC: 10.0000 mg | DELAYED_RELEASE_TABLET | Freq: Every day | ORAL | 1 refills | Status: DC

## 2016-08-20 NOTE — Telephone Encounter (Signed)
Pos upt at Dr. Carolin Coy office. Pt states she is nauseous. No vomiting. lmp 07/06/2016 edd 04/12/2017. 6 3/7 today. Intake scheduled for 12/15. Pt given n/v protocol. Diclegis phoned in to [place] (spoke with Caryl Pina).

## 2016-08-20 NOTE — Telephone Encounter (Signed)
Pt is pregnant, confirmation is in [facility] Dr. Army Melia. She is sick and wanted to know if the Dr. Tennis Must can send her anything for nausea. Her nurse ob intake is 12/15

## 2016-08-23 ENCOUNTER — Encounter: Payer: 59 | Admitting: Obstetrics and Gynecology

## 2016-08-31 ENCOUNTER — Ambulatory Visit (INDEPENDENT_AMBULATORY_CARE_PROVIDER_SITE_OTHER): Payer: 59 | Admitting: Obstetrics and Gynecology

## 2016-08-31 ENCOUNTER — Ambulatory Visit (INDEPENDENT_AMBULATORY_CARE_PROVIDER_SITE_OTHER): Payer: 59

## 2016-08-31 VITALS — BP 117/76 | HR 91 | Ht 64.0 in | Wt 142.2 lb

## 2016-08-31 DIAGNOSIS — N926 Irregular menstruation, unspecified: Secondary | ICD-10-CM

## 2016-08-31 DIAGNOSIS — Z3401 Encounter for supervision of normal first pregnancy, first trimester: Secondary | ICD-10-CM | POA: Diagnosis not present

## 2016-08-31 DIAGNOSIS — Z113 Encounter for screening for infections with a predominantly sexual mode of transmission: Secondary | ICD-10-CM

## 2016-08-31 DIAGNOSIS — O219 Vomiting of pregnancy, unspecified: Secondary | ICD-10-CM

## 2016-08-31 LAB — OB RESULTS CONSOLE VARICELLA ZOSTER ANTIBODY, IGG: VARICELLA IGG: IMMUNE

## 2016-08-31 NOTE — Patient Instructions (Signed)
First Trimester of Pregnancy The first trimester of pregnancy is from week 1 until the end of week 12 (months 1 through 3). A week after a sperm fertilizes an egg, the egg will implant on the wall of the uterus. This embryo will begin to develop into a baby. Genes from you and your partner are forming the baby. The female genes determine whether the baby is a boy or a girl. At 6-8 weeks, the eyes and face are formed, and the heartbeat can be seen on ultrasound. At the end of 12 weeks, all the baby's organs are formed.  Now that you are pregnant, you will want to do everything you can to have a healthy baby. Two of the most important things are to get good prenatal care and to follow your health care provider's instructions. Prenatal care is all the medical care you receive before the baby's birth. This care will help prevent, find, and treat any problems during the pregnancy and childbirth. BODY CHANGES Your body goes through many changes during pregnancy. The changes vary from woman to woman.   You may gain or lose a couple of pounds at first.  You may feel sick to your stomach (nauseous) and throw up (vomit). If the vomiting is uncontrollable, call your health care provider.  You may tire easily.  You may develop headaches that can be relieved by medicines approved by your health care provider.  You may urinate more often. Painful urination may mean you have a bladder infection.  You may develop heartburn as a result of your pregnancy.  You may develop constipation because certain hormones are causing the muscles that push waste through your intestines to slow down.  You may develop hemorrhoids or swollen, bulging veins (varicose veins).  Your breasts may begin to grow larger and become tender. Your nipples may stick out more, and the tissue that surrounds them (areola) may become darker.  Your gums may bleed and may be sensitive to brushing and flossing.  Dark spots or blotches (chloasma,  mask of pregnancy) may develop on your face. This will likely fade after the baby is born.  Your menstrual periods will stop.  You may have a loss of appetite.  You may develop cravings for certain kinds of food.  You may have changes in your emotions from day to day, such as being excited to be pregnant or being concerned that something may go wrong with the pregnancy and baby.  You may have more vivid and strange dreams.  You may have changes in your hair. These can include thickening of your hair, rapid growth, and changes in texture. Some women also have hair loss during or after pregnancy, or hair that feels dry or thin. Your hair will most likely return to normal after your baby is born. WHAT TO EXPECT AT YOUR PRENATAL VISITS During a routine prenatal visit:  You will be weighed to make sure you and the baby are growing normally.  Your blood pressure will be taken.  Your abdomen will be measured to track your baby's growth.  The fetal heartbeat will be listened to starting around week 10 or 12 of your pregnancy.  Test results from any previous visits will be discussed. Your health care provider may ask you:  How you are feeling.  If you are feeling the baby move.  If you have had any abnormal symptoms, such as leaking fluid, bleeding, severe headaches, or abdominal cramping.  If you are using any tobacco products,  including cigarettes, chewing tobacco, and electronic cigarettes.  If you have any questions. Other tests that may be performed during your first trimester include:  Blood tests to find your blood type and to check for the presence of any previous infections. They will also be used to check for low iron levels (anemia) and Rh antibodies. Later in the pregnancy, blood tests for diabetes will be done along with other tests if problems develop.  Urine tests to check for infections, diabetes, or protein in the urine.  An ultrasound to confirm the proper growth  and development of the baby.  An amniocentesis to check for possible genetic problems.  Fetal screens for spina bifida and Down syndrome.  You may need other tests to make sure you and the baby are doing well.  HIV (human immunodeficiency virus) testing. Routine prenatal testing includes screening for HIV, unless you choose not to have this test. HOME CARE INSTRUCTIONS  Medicines   Follow your health care provider's instructions regarding medicine use. Specific medicines may be either safe or unsafe to take during pregnancy.  Take your prenatal vitamins as directed.  If you develop constipation, try taking a stool softener if your health care provider approves. Diet   Eat regular, well-balanced meals. Choose a variety of foods, such as meat or vegetable-based protein, fish, milk and low-fat dairy products, vegetables, fruits, and whole grain breads and cereals. Your health care provider will help you determine the amount of weight gain that is right for you.  Avoid raw meat and uncooked cheese. These carry germs that can cause birth defects in the baby.  Eating four or five small meals rather than three large meals a day may help relieve nausea and vomiting. If you start to feel nauseous, eating a few soda crackers can be helpful. Drinking liquids between meals instead of during meals also seems to help nausea and vomiting.  If you develop constipation, eat more high-fiber foods, such as fresh vegetables or fruit and whole grains. Drink enough fluids to keep your urine clear or pale yellow. Activity and Exercise   Exercise only as directed by your health care provider. Exercising will help you:  Control your weight.  Stay in shape.  Be prepared for labor and delivery.  Experiencing pain or cramping in the lower abdomen or low back is a good sign that you should stop exercising. Check with your health care provider before continuing normal exercises.  Try to avoid standing for  long periods of time. Move your legs often if you must stand in one place for a long time.  Avoid heavy lifting.  Wear low-heeled shoes, and practice good posture.  You may continue to have sex unless your health care provider directs you otherwise. Relief of Pain or Discomfort   Wear a good support bra for breast tenderness.   Take warm sitz baths to soothe any pain or discomfort caused by hemorrhoids. Use hemorrhoid cream if your health care provider approves.   Rest with your legs elevated if you have leg cramps or low back pain.  If you develop varicose veins in your legs, wear support hose. Elevate your feet for 15 minutes, 3-4 times a day. Limit salt in your diet. Prenatal Care   Schedule your prenatal visits by the twelfth week of pregnancy. They are usually scheduled monthly at first, then more often in the last 2 months before delivery.  Write down your questions. Take them to your prenatal visits.  Keep all your prenatal  visits as directed by your health care provider. Safety   Wear your seat belt at all times when driving.  Make a list of emergency phone numbers, including numbers for family, friends, the hospital, and police and fire departments. General Tips   Ask your health care provider for a referral to a local prenatal education class. Begin classes no later than at the beginning of month 6 of your pregnancy.  Ask for help if you have counseling or nutritional needs during pregnancy. Your health care provider can offer advice or refer you to specialists for help with various needs.  Do not use hot tubs, steam rooms, or saunas.  Do not douche or use tampons or scented sanitary pads.  Do not cross your legs for long periods of time.  Avoid cat litter boxes and soil used by cats. These carry germs that can cause birth defects in the baby and possibly loss of the fetus by miscarriage or stillbirth.  Avoid all smoking, herbs, alcohol, and medicines not  prescribed by your health care provider. Chemicals in these affect the formation and growth of the baby.  Do not use any tobacco products, including cigarettes, chewing tobacco, and electronic cigarettes. If you need help quitting, ask your health care provider. You may receive counseling support and other resources to help you quit.  Schedule a dentist appointment. At home, brush your teeth with a soft toothbrush and be gentle when you floss. SEEK MEDICAL CARE IF:   You have dizziness.  You have mild pelvic cramps, pelvic pressure, or nagging pain in the abdominal area.  You have persistent nausea, vomiting, or diarrhea.  You have a bad smelling vaginal discharge.  You have pain with urination.  You notice increased swelling in your face, hands, legs, or ankles. SEEK IMMEDIATE MEDICAL CARE IF:   You have a fever.  You are leaking fluid from your vagina.  You have spotting or bleeding from your vagina.  You have severe abdominal cramping or pain.  You have rapid weight gain or loss.  You vomit blood or material that looks like coffee grounds.  You are exposed to [place] measles and have never had them.  You are exposed to fifth disease or chickenpox.  You develop a severe headache.  You have shortness of breath.  You have any kind of trauma, such as from a fall or a car accident. This information is not intended to replace advice given to you by your health care provider. Make sure you discuss any questions you have with your health care provider. Document Released: 08/28/2001 Document Revised: 09/24/2014 Document Reviewed: 07/14/2013 Elsevier Interactive Patient Education  2017 [place]. Commonly Asked Questions During Pregnancy  Cats: A parasite can be excreted in cat feces.  To avoid exposure you need to have another person empty the little box.  If you must empty the litter box you will need to wear gloves.  Wash your hands after handling your cat.  This parasite  can also be found in raw or undercooked meat so this should also be avoided.  Colds, Sore Throats, Flu: Please check your medication sheet to see what you can take for symptoms.  If your symptoms are unrelieved by these medications please call the office.  Dental Work: Most any dental work [employer] recommends is permitted.  X-rays should only be taken during the first trimester if absolutely necessary.  Your abdomen should be shielded with a lead apron during all x-rays.  Please notify your provider  prior to receiving any x-rays.  Novocaine is fine; gas is not recommended.  If your dentist requires a note from [place] prior to dental work please call the office and we will provide one for you.  Exercise: Exercise is an important part of staying healthy during your pregnancy.  You may continue most exercises you were accustomed to prior to pregnancy.  Later in your pregnancy you will most likely notice you have difficulty with activities requiring balance like riding a bicycle.  It is important that you listen to your body and avoid activities that put you at a higher risk of falling.  Adequate rest and staying well hydrated are a must!  If you have questions about the safety of specific activities ask your provider.    Exposure to Children with illness: Try to avoid obvious exposure; report any symptoms to [place] when noted,  If you have chicken pos, red measles or mumps, you should be immune to these diseases.   Please do not take any vaccines while pregnant unless you have checked with your OB provider.  Fetal Movement: After 28 weeks we recommend you do "kick counts" twice daily.  Lie or sit down in a calm quiet environment and count your baby movements "kicks".  You should feel your baby at least 10 times per hour.  If you have not felt 10 kicks within the first hour get up, walk around and have something sweet to eat or drink then repeat for an additional hour.  If count remains less than 10 per hour  notify your provider.  Fumigating: Follow your pest control agent's advice as to how long to stay out of your home.  Ventilate the area well before re-entering.  Hemorrhoids:   Most over-the-counter preparations can be used during pregnancy.  Check your medication to see what is safe to use.  It is important to use a stool softener or fiber in your diet and to drink lots of liquids.  If hemorrhoids seem to be getting worse please call the office.   Hot Tubs:  Hot tubs Jacuzzis and saunas are not recommended while pregnant.  These increase your internal body temperature and should be avoided.  Intercourse:  Sexual intercourse is safe during pregnancy as long as you are comfortable, unless otherwise advised by your provider.  Spotting may occur after intercourse; report any bright red bleeding that is heavier than spotting.  Labor:  If you know that you are in labor, please go to the hospital.  If you are unsure, please call the office and let us help you decide what to do.  Lifting, straining, etc:  If your job requires heavy lifting or straining please check with your provider for any limitations.  Generally, you should not lift items heavier than that you can lift simply with your hands and arms (no back muscles)  Painting:  Paint fumes do not harm your pregnancy, but may make you ill and should be avoided if possible.  Latex or water based paints have less odor than oils.  Use adequate ventilation while painting.  Permanents & Hair Color:  Chemicals in hair dyes are not recommended as they cause increase hair dryness which can increase hair loss during pregnancy.  " Highlighting" and permanents are allowed.  Dye may be absorbed differently and permanents may not hold as well during pregnancy.  Sunbathing:  Use a sunscreen, as skin burns easily during pregnancy.  Drink plenty of fluids; avoid over heating.  Tanning Beds:  Because their possible side effects are still unknown, tanning beds are  not recommended.  Ultrasound Scans:  Routine ultrasounds are performed at approximately 20 weeks.  You will be able to see your baby's general anatomy an if you would like to know the gender this can usually be determined as well.  If it is questionable when you conceived you may also receive an ultrasound early in your pregnancy for dating purposes.  Otherwise ultrasound exams are not routinely performed unless there is a medical necessity.  Although you can request a scan we ask that you pay for it when conducted because insurance does not cover " patient request" scans.  Work: If your pregnancy proceeds without complications you may work until your due date, unless your physician or employer advises otherwise.  Round Ligament Pain/Pelvic Discomfort:  Sharp, shooting pains not associated with bleeding are fairly common, usually occurring in the second trimester of pregnancy.  They tend to be worse when standing up or when you remain standing for long periods of time.  These are the result of pressure of certain pelvic ligaments called "round ligaments".  Rest, Tylenol and heat seem to be the most effective relief.  As the womb and fetus grow, they rise out of the pelvis and the discomfort improves.  Please notify the office if your pain seems different than that described.  It may represent a more serious condition.  Pregnancy and Zika Virus Disease Introduction Zika virus disease, or Zika, is an illness that can spread to people from mosquitoes that carry the virus. It may also spread from person to person through infected body fluids. Zika first occurred in [place], but recently it has spread to new areas. The virus occurs in tropical climates. The location of Zika continues to change. Most people who become infected with Zika virus do not develop serious illness. However, Zika may cause birth defects in an unborn baby whose mother is infected with the virus. It may also increase the risk of  miscarriage. What are the symptoms of Zika virus disease? In many cases, people who have been infected with Zika virus do not develop any symptoms. If symptoms appear, they usually start about a week after the person is infected. Symptoms are usually mild. They may include:  Fever.  Rash.  Red eyes.  Joint pain. How does Zika virus disease spread? The main way that [place] virus spreads is through the bite of a certain type of mosquito. Unlike most types of mosquitos, which bite only at night, the type of mosquito that carries Zika virus bites both at night and during the day. Zika virus can also spread through sexual contact, through a blood transfusion, and from a mother to her baby before or during birth. Once you have had Zika virus disease, it is unlikely that you will get it again. Can I pass Zika to my baby during pregnancy? Yes, Zika can pass from a mother to her baby before or during birth. What problems can Zika cause for my baby? A woman who is infected with Zika virus while pregnant is at risk of having her baby born with a condition in which the brain or head is smaller than expected (microcephaly). Babies who have microcephaly can have developmental delays, seizures, hearing problems, and vision problems. Having Zika virus disease during pregnancy can also increase the risk of miscarriage. How can Zika virus disease be prevented? There is no vaccine to prevent Zika. The best way to prevent the disease is  to avoid infected mosquitoes and avoid exposure to body fluids that can spread the virus. Avoid any possible exposure to [place] by taking the following precautions. For women and their sex partners:  Avoid traveling to high-risk areas. The locations where [place] is being reported change often. To identify high-risk areas, check the CDC travel website: CreditChaos.com.ee  If you or your sex partner must travel to a high-risk area, talk with a health care provider before  and after traveling.  Take all precautions to avoid mosquito bites if you live in, or travel to, any of the high-risk areas. Insect repellents are safe to use during pregnancy.  Ask your health care provider when it is safe to have sexual contact. For women:  If you are pregnant or trying to become pregnant, avoid sexual contact with persons who may have been exposed to [place] virus, persons who have possible symptoms of Zika, or persons whose history you are unsure about. If you choose to have sexual contact with someone who may have been exposed to [place] virus, use condoms correctly during the entire duration of sexual activity, every time. Do not share sexual devices, as you may be exposed to body fluids.  Ask your health care provider about when it is safe to attempt pregnancy after a possible exposure to [place] virus. What steps should I take to avoid mosquito bites? Take these steps to avoid mosquito bites when you are in a high-risk area:  Wear loose clothing that covers your arms and legs.  Limit your outdoor activities.  Do not open windows unless they have window screens.  Sleep under mosquito nets.  Use insect repellent. The best insect repellents have:  DEET, picaridin, oil of lemon eucalyptus (OLE), or [id number] in them.  Higher amounts of an active ingredient in them.  Remember that insect repellents are safe to use during pregnancy.  Do not use OLE on children who are younger than 76 years of age. Do not use insect repellent on babies who are younger than 11 months of age.  Cover your child's stroller with mosquito netting. Make sure the netting fits snugly and that any loose netting does not cover your child's mouth or nose. Do not use a blanket as a mosquito-protection cover.  Do not apply insect repellent underneath clothing.  If you are using sunscreen, apply the sunscreen before applying the insect repellent.  Treat clothing with permethrin. Do not apply permethrin  directly to your skin. Follow label directions for safe use.  Get rid of standing water, where mosquitoes may reproduce. Standing water is often found in items such as buckets, bowls, animal food dishes, and flowerpots. When you return from traveling to any high-risk area, continue taking actions to protect yourself against mosquito bites for 3 weeks, even if you show no signs of illness. This will prevent spreading Zika virus to uninfected mosquitoes. What should I know about the sexual transmission of Zika? People can spread Zika to their sexual partners during vaginal, anal, or oral sex, or by sharing sexual devices. Many people with [place] do not develop symptoms, so a person could spread the disease without knowing that they are infected. The greatest risk is to women who are pregnant or who may become pregnant. Zika virus can live longer in semen than it can live in blood. Couples can prevent sexual transmission of the virus by:  Using condoms correctly during the entire duration of sexual activity, every time. This includes vaginal, anal, and oral sex.  Not  sharing sexual devices. Sharing increases your risk of being exposed to body fluid from another person.  Avoiding all sexual activity until your health care provider says it is safe. Should I be tested for Zika virus? A sample of your blood can be tested for Zika virus. A pregnant woman should be tested if she may have been exposed to the virus or if she has symptoms of Zika. She may also have additional tests done during her pregnancy, such ultrasound testing. Talk with your health care provider about which tests are recommended. This information is not intended to replace advice given to you by your health care provider. Make sure you discuss any questions you have with your health care provider. Document Released: 05/25/2015 Document Revised: 02/09/2016 Document Reviewed: 05/18/2015  2017 Else[employer] Minor Illnesses and Medications in  Pregnancy  Cold/Flu:  Sudafed for congestion- Robitussin (plain) for cough- Tylenol for discomfort.  Please follow the directions on the label.  Try not to take any more than needed.  OTC Saline nasal spray and air humidifier or cool-mist  Vaporizer to sooth nasal irritation and to loosen congestion.  It is also important to increase intake of non carbonated fluids, especially if you have a fever.  Constipation:  Colace-2 capsules at bedtime; Metamucil- follow directions on label; Senokot- 1 tablet at bedtime.  Any one of these medications can be used.  It is also very important to increase fluids and fruits along with regular exercise.  If problem persists please call the office.  Diarrhea:  Kaopectate as directed on the label.  Eat a bland diet and increase fluids.  Avoid highly seasoned foods.  Headache:  Tylenol 1 or 2 tablets every 3-4 hours as needed  Indigestion:  Maalox, Mylanta, Tums or Rolaids- as directed on label.  Also try to eat small meals and avoid fatty, greasy or spicy foods.  Nausea with or without Vomiting:  Nausea in pregnancy is caused by increased levels of hormones in the body which influence the digestive system and cause irritation when stomach acids accumulate.  Symptoms usually subside after 1st trimester of pregnancy.  Try the following: 1. Keep saltines, graham crackers or dry toast by your bed to eat upon awakening. 2. Don't let your stomach get empty.  Try to eat 5-6 small meals per day instead of 3 large ones. 3. Avoid greasy fatty or highly seasoned foods.  4. Take OTC Unisom 1 tablet at bed time along with OTC Vitamin B6 25-50 mg 3 times per day.    If nausea continues with vomiting and you are unable to keep down food and fluids you may need a prescription medication.  Please notify your provider.   Sore throat:  Chloraseptic spray, throat lozenges and or plain Tylenol.  Vaginal Yeast Infection:  OTC Monistat for 7 days as directed on label.  If symptoms do  not resolve within a week notify provider.  If any of the above problems do not subside with recommended treatment please call the office for further assistance.   Do not take Aspirin, Advil, Motrin or Ibuprofen.  * * OTC= Over the counter How a Baby Grows During Pregnancy Introduction Pregnancy begins when a female's sperm enters a female's egg (fertilization). This happens in one of the tubes (fallopian tubes) that connect the ovaries to the womb (uterus). The fertilized egg is called an embryo until it reaches 10 weeks. From 10 weeks until birth, it is called a fetus. The fertilized egg moves down the fallopian tube  to the uterus. Then it implants into the lining of the uterus and begins to grow. The developing fetus receives oxygen and nutrients through the pregnant woman's bloodstream and the tissues that grow (placenta) to support the fetus. The placenta is the life support system for the fetus. It provides nutrition and removes waste. Learning as much as you can about your pregnancy and how your baby is developing can help you enjoy the experience. It can also make you aware of when there might be a problem and when to ask questions. How long does a typical pregnancy last? A pregnancy usually lasts 280 days, or about 40 weeks. Pregnancy is divided into three trimesters:  First trimester: 0-13 weeks.  Second trimester: 14-27 weeks.  Third trimester: 28-40 weeks. The day when your baby is considered ready to be born (full term) is your estimated date of delivery. How does my baby develop month by month? First month  The fertilized egg attaches to the inside of the uterus.  Some cells will form the placenta. Others will form the fetus.  The arms, legs, brain, spinal cord, lungs, and heart begin to develop.  At the end of the first month, the heart begins to beat. Second month  The bones, inner ear, eyelids, hands, and feet form.  The genitals develop.  By the end of 8 weeks, all  major organs are developing. Third month  All of the internal organs are forming.  Teeth develop below the gums.  Bones and muscles begin to grow. The spine can flex.  The skin is transparent.  Fingernails and toenails begin to form.  Arms and legs continue to grow longer, and hands and feet develop.  The fetus is about 3 in (7.6 cm) long. Fourth month  The placenta is completely formed.  The external sex organs, neck, outer ear, eyebrows, eyelids, and fingernails are formed.  The fetus can hear, swallow, and move its arms and legs.  The kidneys begin to produce urine.  The skin is covered with a white waxy coating (vernix) and very fine hair (lanugo). Fifth month  The fetus moves around more and can be felt for the first time (quickening).  The fetus starts to sleep and wake up and may begin to suck its finger.  The nails grow to the end of the fingers.  The organ in the digestive system that makes bile (gallbladder) functions and helps to digest the nutrients.  If your baby is a girl, eggs are present in her ovaries. If your baby is a boy, testicles start to move down into his scrotum. Sixth month  The lungs are formed, but the fetus is not yet able to breathe.  The eyes open. The brain continues to develop.  Your baby has fingerprints and toe prints. Your baby's hair grows thicker.  At the end of the second trimester, the fetus is about 9 in (22.9 cm) long. Seventh month  The fetus kicks and stretches.  The eyes are developed enough to sense changes in light.  The hands can make a grasping motion.  The fetus responds to sound. Eighth month  All organs and body systems are fully developed and functioning.  Bones harden and taste buds develop. The fetus may hiccup.  Certain areas of the brain are still developing. The skull remains soft. Ninth month  The fetus gains about  lb (0.23 kg) each week.  The lungs are fully developed.  Patterns of sleep  develop.  The fetus's head typically  moves into a head-down position (vertex) in the uterus to prepare for birth. If the buttocks move into a vertex position instead, the baby is breech.  The fetus weighs 6-9 lbs (2.72-4.08 kg) and is 19-20 in (48.26-50.8 cm) long. What can I do to have a healthy pregnancy and help my baby develop?  Eating and Drinking  Eat a healthy diet.  Talk with your health care provider to make sure that you are getting the nutrients that you and your baby need.  Visit www.BuildDNA.es to learn about creating a healthy diet.  Gain a healthy amount of weight during pregnancy as advised by your health care provider. This is usually 25-35 pounds. You may need to:  Gain more if you were underweight before getting pregnant or if you are pregnant with more than one baby.  Gain less if you were overweight or obese when you got pregnant. Medicines and Vitamins  Take prenatal vitamins as directed by your health care provider. These include vitamins such as folic acid, iron, calcium, and vitamin D. They are important for healthy development.  Take medicines only as directed by your health care provider. Read labels and ask a pharmacist or your health care provider whether over-the-counter medicines, supplements, and prescription drugs are safe to take during pregnancy. Activities  Be physically active as advised by your health care provider. Ask your health care provider to recommend activities that are safe for you to do, such as walking or swimming.  Do not participate in strenuous or extreme sports.  Lifestyle  Do not drink alcohol.  Do not use any tobacco products, including cigarettes, chewing tobacco, or electronic cigarettes. If you need help quitting, ask your health care provider.  Do not use illegal drugs. Safety  Avoid exposure to mercury, lead, or other heavy metals. Ask your health care provider about common sources of these heavy metals.  Avoid  listeria infection during pregnancy. Follow these precautions:  Do not eat soft cheeses or deli meats.  Do not eat hot dogs unless they have been warmed up to the point of steaming, such as in the microwave oven.  Do not drink unpasteurized milk.  Avoid toxoplasmosis infection during pregnancy. Follow these precautions:  Do not change your cat's litter box, if you have a cat. Ask someone else to do this for you.  Wear gardening gloves while working in the yard. General Instructions  Keep all follow-up visits as directed by your health care provider. This is important. This includes prenatal care and screening tests.  Manage any chronic health conditions. Work closely with your health care provider to keep conditions, such as diabetes, under control. How do I know if my baby is developing well? At each prenatal visit, your health care provider will do several different tests to check on your health and keep track of your baby's development. These include:  Fundal height.  Your health care provider will measure your growing belly from top to bottom using a tape measure.  Your health care provider will also feel your belly to determine your baby's position.  Heartbeat.  An ultrasound in the first trimester can confirm pregnancy and show a heartbeat, depending on how far along you are.  Your health care provider will check your baby's heart rate at every prenatal visit.  As you get closer to your delivery date, you may have regular fetal heart rate monitoring to make sure that your baby is not in distress.  Second trimester ultrasound.  This ultrasound checks  your baby's development. It also indicates your baby's gender. What should I do if I have concerns about my baby's development? Always talk with your health care provider about any concerns that you may have. This information is not intended to replace advice given to you by your health care provider. Make sure you discuss  any questions you have with your health care provider. Document Released: 02/20/2008 Document Revised: 02/09/2016 Document Reviewed: 02/10/2014  2017 Elsevier

## 2016-08-31 NOTE — Progress Notes (Signed)
Sarah Edwards presents for NOB nurse interview visit. Pregnancy confirmation done at  Dr. Ron Agee office on _11/17/2017.  G3- .  P1011- .Pt states her last cycle was unusual light. Will order dating scan. Pregnancy education material explained and given. _0_ cats in the home. NOB labs ordered. HIV labs and drug screen were ordered. PNV encouraged. Pt has been taking Diclegis at bedtime for mild nausea and vomiting. Pt states Diclegis works at bedtime but not so much during the day. Advised b6 77m q6 during the day. Eat every couple of hours and stay hydrated. Encouraged preggie pops. Pt to f/u at nob [facility]. Genetic screening  to discuss with provider. Pt. To follow up with MSharyn Lull in _3_ weeks for NOB physical.  All questions answered.

## 2016-09-01 LAB — NICOTINE SCREEN, URINE: COTININE UR QL SCN: NEGATIVE ng/mL

## 2016-09-01 LAB — CBC WITH DIFFERENTIAL/PLATELET
BASOS: 0 %
Basophils Absolute: 0 10*3/uL (ref 0.0–0.2)
EOS (ABSOLUTE): 0 10*3/uL (ref 0.0–0.4)
EOS: 1 %
HEMATOCRIT: 32.9 % — AB (ref 34.0–46.6)
Hemoglobin: 10.6 g/dL — ABNORMAL LOW (ref 11.1–15.9)
IMMATURE GRANS (ABS): 0 10*3/uL (ref 0.0–0.1)
IMMATURE GRANULOCYTES: 0 %
LYMPHS: 21 %
Lymphocytes Absolute: 1.2 10*3/uL (ref 0.7–3.1)
MCH: 25.2 pg — ABNORMAL LOW (ref 26.6–33.0)
MCHC: 32.2 g/dL (ref 31.5–35.7)
MCV: 78 fL — ABNORMAL LOW (ref 79–97)
MONOS ABS: 0.4 10*3/uL (ref 0.1–0.9)
Monocytes: 7 %
NEUTROS PCT: 71 %
Neutrophils Absolute: 4 10*3/uL (ref 1.4–7.0)
PLATELETS: 234 10*3/uL (ref 150–379)
RBC: 4.21 x10E6/uL (ref 3.77–5.28)
RDW: 15.6 % — AB (ref 12.3–15.4)
WBC: 5.6 10*3/uL (ref 3.4–10.8)

## 2016-09-01 LAB — MONITOR DRUG PROFILE 14(MW)
AMPHETAMINE SCREEN URINE: NEGATIVE ng/mL
BARBITURATE SCREEN URINE: NEGATIVE ng/mL
BENZODIAZEPINE SCREEN, URINE: NEGATIVE ng/mL
Buprenorphine, Urine: NEGATIVE ng/mL
CANNABINOIDS UR QL SCN: NEGATIVE ng/mL
Cocaine (Metab) Scrn, Ur: NEGATIVE ng/mL
Creatinine(Crt), U: 185.1 mg/dL (ref 20.0–300.0)
Fentanyl, Urine: NEGATIVE pg/mL
Meperidine Screen, Urine: NEGATIVE ng/mL
Methadone Screen, Urine: NEGATIVE ng/mL
OXYCODONE+OXYMORPHONE UR QL SCN: NEGATIVE ng/mL
Opiate Scrn, Ur: NEGATIVE ng/mL
PH UR, DRUG SCRN: 7 (ref 4.5–8.9)
PROPOXYPHENE SCREEN URINE: NEGATIVE ng/mL
Phencyclidine Qn, Ur: NEGATIVE ng/mL
SPECIFIC GRAVITY: 1.025
TRAMADOL SCREEN, URINE: NEGATIVE ng/mL

## 2016-09-01 LAB — URINALYSIS, ROUTINE W REFLEX MICROSCOPIC
Bilirubin, UA: NEGATIVE
GLUCOSE, UA: NEGATIVE
KETONES UA: NEGATIVE
LEUKOCYTES UA: NEGATIVE
Nitrite, UA: NEGATIVE
PROTEIN UA: NEGATIVE
RBC, UA: NEGATIVE
SPEC GRAV UA: 1.026 (ref 1.005–1.030)
Urobilinogen, Ur: 1 mg/dL (ref 0.2–1.0)
pH, UA: 7 (ref 5.0–7.5)

## 2016-09-01 LAB — RPR: RPR: NONREACTIVE

## 2016-09-01 LAB — HEPATITIS B SURFACE ANTIGEN: Hepatitis B Surface Ag: NEGATIVE

## 2016-09-01 LAB — ABO AND RH: Rh Factor: POSITIVE

## 2016-09-01 LAB — RUBELLA SCREEN: RUBELLA: 8.11 {index} (ref >0.99)

## 2016-09-01 LAB — VARICELLA ZOSTER ANTIBODY, IGG: Varicella zoster IgG: 784 index (ref >165)

## 2016-09-01 LAB — ANTIBODY SCREEN: Antibody Screen: NEGATIVE

## 2016-09-01 LAB — HIV ANTIBODY (ROUTINE TESTING W REFLEX): HIV SCREEN 4TH GENERATION: NONREACTIVE

## 2016-09-02 LAB — CULTURE, OB URINE

## 2016-09-02 LAB — URINE CULTURE, OB REFLEX: Organism ID, Bacteria: NO GROWTH

## 2016-09-05 LAB — GC/CHLAMYDIA PROBE AMP
CHLAMYDIA, DNA PROBE: NEGATIVE
NEISSERIA GONORRHOEAE BY PCR: NEGATIVE

## 2016-09-11 ENCOUNTER — Other Ambulatory Visit: Payer: Self-pay | Admitting: Obstetrics and Gynecology

## 2016-09-11 DIAGNOSIS — D649 Anemia, unspecified: Secondary | ICD-10-CM

## 2016-09-11 MED ORDER — FUSION PLUS PO CAPS: 1.0000 | ORAL_CAPSULE | Freq: Every day | ORAL | 1 refills | Status: DC

## 2016-09-17 NOTE — L&D Delivery Note (Signed)
Delivery Note  0018 Called in room to see patient, reports pelvic pressure with the urge to push. SVE: 10/100/+3, BBOW, vertex. Effective maternal pushing efforts.   At 0032 spontaneous vaginal birth of a viable female infant over an intact perineum in posterior occiput position. Infant immediately to maternal abdomen. Delayed cord clamping and three (3) vessel cord. Receiving nurse at bedside for birth. APGAR: 8, 9. weight: 3580 grams (7 pounds 14 ounces).    Large gush of blood noted. Started active management of third stage. Attempt x three (3) for spontaneous removal of placenta discontinued to to partial separation in cord.   2751 Straight cath. 0.5 mg Dilaudid IV given. Attempt at [facility] extraction of placenta x two (2), unsuccessful. Continuous funfal rubs initiated. [place] notified of retained placenta. 0113: Cord avulsion after attempt to remove placenta. 0016: 800 mcg Cytotec given by rectum. 0121: Manuel extraction of placenta by Dr. Marcelline Mates. Bedside US performed and no retained products noted. EBL: 650 ml. Vault check completed. Counts correct x two (2).   Clindamycin 900 mg IV x one (1) dose. Stat H&H. Routine postpartum care and orders.   Mom to postpartum.  Baby to Couplet care / Skin to Skin.  FOB and mother of patient present at beside for birth.    Diona Fanti, CNM 04/18/2017, 1:53 AM

## 2016-09-20 ENCOUNTER — Encounter: Payer: Self-pay | Admitting: Certified Nurse Midwife

## 2016-09-20 ENCOUNTER — Ambulatory Visit (INDEPENDENT_AMBULATORY_CARE_PROVIDER_SITE_OTHER): Payer: 59 | Admitting: Certified Nurse Midwife

## 2016-09-20 VITALS — BP 118/61 | HR 82 | Wt 137.1 lb

## 2016-09-20 DIAGNOSIS — Z3491 Encounter for supervision of normal pregnancy, unspecified, first trimester: Secondary | ICD-10-CM

## 2016-09-20 LAB — POCT URINALYSIS DIPSTICK
GLUCOSE UA: NEGATIVE
Protein, UA: NEGATIVE

## 2016-09-20 NOTE — Patient Instructions (Signed)
Second Trimester of Pregnancy The second trimester is from week 13 through week 28, month 4 through 6. This is often the time in pregnancy that you feel your best. Often times, morning sickness has lessened or quit. You may have more energy, and you may get hungry more often. Your unborn baby (fetus) is growing rapidly. At the end of the sixth month, he or she is about 9 inches long and weighs about 1 pounds. You will likely feel the baby move (quickening) between 18 and 20 weeks of pregnancy. Follow these instructions at home:  Avoid all smoking, herbs, and alcohol. Avoid drugs not approved by your doctor.  Do not use any tobacco products, including cigarettes, chewing tobacco, and electronic cigarettes. If you need help quitting, ask your doctor. You may get counseling or other support to help you quit.  Only take medicine as told by your doctor. Some medicines are safe and some are not during pregnancy.  Exercise only as told by your doctor. Stop exercising if you start having cramps.  Eat regular, healthy meals.  Wear a good support bra if your breasts are tender.  Do not use hot tubs, steam rooms, or saunas.  Wear your seat belt when driving.  Avoid raw meat, uncooked cheese, and liter boxes and soil used by cats.  Take your prenatal vitamins.  Take 1500-2000 milligrams of calcium daily starting at the 20th week of pregnancy until you deliver your baby.  Try taking medicine that helps you poop (stool softener) as needed, and if your doctor approves. Eat more fiber by eating fresh fruit, vegetables, and whole grains. Drink enough fluids to keep your pee (urine) clear or pale yellow.  Take warm water baths (sitz baths) to soothe pain or discomfort caused by hemorrhoids. Use hemorrhoid cream if your doctor approves.  If you have puffy, bulging veins (varicose veins), wear support hose. Raise (elevate) your feet for 15 minutes, 3-4 times a day. Limit salt in your diet.  Avoid heavy  lifting, wear low heals, and sit up straight.  Rest with your legs raised if you have leg cramps or low back pain.  Visit your dentist if you have not gone during your pregnancy. Use a soft toothbrush to brush your teeth. Be gentle when you floss.  You can have sex (intercourse) unless your doctor tells you not to.  Go to your doctor visits. Get help if:  You feel dizzy.  You have mild cramps or pressure in your lower belly (abdomen).  You have a nagging pain in your belly area.  You continue to feel sick to your stomach (nauseous), throw up (vomit), or have watery poop (diarrhea).  You have bad smelling fluid coming from your vagina.  You have pain with peeing (urination). Get help right away if:  You have a fever.  You are leaking fluid from your vagina.  You have spotting or bleeding from your vagina.  You have severe belly cramping or pain.  You lose or gain weight rapidly.  You have trouble catching your breath and have chest pain.  You notice sudden or extreme puffiness (swelling) of your face, hands, ankles, feet, or legs.  You have not felt the baby move in over an hour.  You have severe headaches that do not go away with medicine.  You have vision changes. This information is not intended to replace advice given to you by your health care provider. Make sure you discuss any questions you have with your health care  provider. Document Released: 11/28/2009 Document Revised: 02/09/2016 Document Reviewed: 11/04/2012 Elsevier Interactive Patient Education  2017 [employer].

## 2016-09-20 NOTE — Progress Notes (Signed)
NEW OB HISTORY AND PHYSICAL  SUBJECTIVE:       Sarah Edwards is a 26 y.o. [phone] female, Patient's last menstrual period was 07/11/2016 (approximate)., Estimated Date of Delivery: 04/17/17, [redacted]w[redacted]d presents today for establishment of Prenatal Care.  She has no unusual complaints and complains of nausea without vomiting that is resolving with a change of eating habits. [place] eating small frequent meals and a snack throughout the day without difficulty.    Denies difficulty breathing or respiratory distress, headache, visual changes, abdominal pain, leg pain or swelling, or vaginal bleeding.   Gynecologic History Patient's last menstrual period was 07/11/2016 (approximate). Irregular and Normal Contraception: none Last Pap: 08/2015. Results were: normal  Obstetric History OB History  Gravida Para Term Preterm AB Living  3 1 1  0 1 1  SAB TAB Ectopic Multiple Live Births  1 0 0 0 1    # Outcome Date GA Lbr Len/2nd Weight Sex Delivery Anes PTL Lv  3 Current           2 Term 09/17/09   6 lb 12 oz (3.062 kg) F Vag-Spont   LIV  1 SAB        N       Past Medical History:  Diagnosis Date  . Abnormal uterine bleeding (AUB)   . Anxiety   . Chronic UTI   . Crohn disease ([place]   . Endometriosis   . IBS (irritable bowel syndrome)   . Painful menstrual periods   . SAB (spontaneous abortion) 01/06/2014    Past Surgical History:  Procedure Laterality Date  . COLONOSCOPY N/A 2010 and 2011   crohns  . ESOPHAGOGASTRODUODENOSCOPY ENDOSCOPY N/A 2010  . laproscopy  2012    Current Outpatient Prescriptions on File Prior to Visit  Medication Sig Dispense Refill  . Doxylamine-Pyridoxine 10-10 MG TBEC Take 10 mg by mouth daily. (Patient not taking: Reported on 09/20/2016) 60 tablet 1  . Iron-FA-B Cmp-C-Biot-Probiotic (FUSION PLUS) CAPS Take 1 capsule by mouth daily. (Patient not taking: Reported on 09/20/2016) 60 capsule 1  . Prenatal Vit-Fe Fumarate-FA (PRENATAL MULTIVITAMIN) TABS  tablet Take 1 tablet by mouth daily at 12 noon.     No current facility-administered medications on file prior to visit.     Allergies  Allergen Reactions  . Azithromycin Swelling  . Amoxicillin Rash  . Penicillins Rash  . Sulfa Antibiotics Nausea And Vomiting    Constant vomiting    Social History   Social History  . Marital status: Single    Spouse name: N/A  . Number of children: N/A  . Years of education: N/A   Occupational History  . Not on file.   Social History Main Topics  . Smoking status: Former Smoker    Packs/day: 0.50    Types: Cigarettes    Quit date: 03/18/2015  . Smokeless tobacco: Never Used  . Alcohol use No     Comment: occas  . Drug use: No  . Sexual activity: Yes    Birth control/ protection: None   Other Topics Concern  . Not on file   Social History Narrative  . No narrative on file    Family History  Problem Relation Age of Onset  . Cancer Paternal Grandmother     breast   . Ovarian cancer Paternal Grandmother   . Cervical cancer Paternal Grandmother   . Lung cancer Paternal Grandfather   . Leukemia Maternal Grandfather   . Diabetes Mother   . Diabetes Father  The following portions of the patient's history were reviewed and updated as appropriate: allergies, current medications, past OB history, past medical history, past surgical history, past family history, past social history, and problem list.    OBJECTIVE: Initial Physical Exam (New OB)  GENERAL APPEARANCE: alert, well appearing, in no apparent distress, oriented to person, place and time  HEAD: normocephalic, atraumatic  MOUTH: mucous membranes moist, pharynx normal without lesions and dental hygiene good  THYROID: no thyromegaly or masses present  BREASTS: no masses noted, no significant tenderness, no palpable axillary nodes, no skin changes  LUNGS: clear to auscultation, no wheezes, rales or rhonchi, symmetric air entry  HEART: regular rate and rhythm, no  murmurs  ABDOMEN: soft, nontender, nondistended, no abnormal masses, no epigastric pain and FHT present  EXTREMITIES: no redness or tenderness in the calves or thighs  SKIN: normal coloration and turgor, no rashes, four professional tattoos  LYMPH NODES: no adenopathy palpable  NEUROLOGIC: alert, oriented, normal speech, no focal findings or movement disorder noted   PELVIC EXAM EXTERNAL GENITALIA: normal appearing vulva with no masses, tenderness or lesions  VAGINA: no abnormal discharge or lesions  CERVIX: no cervical motion tenderness  UTERUS: gravid and consistent with 25 weeks  ADNEXA: no masses palpable and nontender  OB EXAM PELVIMETRY: appears adequate  ASSESSMENT: Normal pregnancy  PLAN: Prenatal care Declines Genetic testing RTC x 5 weeks or sooner if needed See orders  Diona Fanti, CNM

## 2016-09-20 NOTE — Progress Notes (Signed)
Pt states she had pregnancy confirmed by serum and urine at PCP.

## 2016-09-23 ENCOUNTER — Encounter: Payer: Self-pay | Admitting: Internal Medicine

## 2016-09-23 ENCOUNTER — Observation Stay
Admission: EM | Admit: 2016-09-23 | Discharge: 2016-09-24 | Disposition: A | Discharge status: Home / Self Care | Payer: 59 | Attending: Internal Medicine | Admitting: Internal Medicine

## 2016-09-23 ENCOUNTER — Emergency Department: Payer: 59

## 2016-09-23 ENCOUNTER — Observation Stay: Payer: 59

## 2016-09-23 DIAGNOSIS — M7989 Other specified soft tissue disorders: Secondary | ICD-10-CM | POA: Diagnosis not present

## 2016-09-23 DIAGNOSIS — R079 Chest pain, unspecified: Secondary | ICD-10-CM | POA: Insufficient documentation

## 2016-09-23 DIAGNOSIS — Z87891 Personal history of nicotine dependence: Secondary | ICD-10-CM | POA: Insufficient documentation

## 2016-09-23 DIAGNOSIS — Z3A11 11 weeks gestation of pregnancy: Secondary | ICD-10-CM | POA: Diagnosis not present

## 2016-09-23 DIAGNOSIS — O9989 Other specified diseases and conditions complicating pregnancy, childbirth and the puerperium: Secondary | ICD-10-CM | POA: Diagnosis not present

## 2016-09-23 DIAGNOSIS — R0600 Dyspnea, unspecified: Secondary | ICD-10-CM | POA: Insufficient documentation

## 2016-09-23 DIAGNOSIS — R739 Hyperglycemia, unspecified: Secondary | ICD-10-CM | POA: Insufficient documentation

## 2016-09-23 DIAGNOSIS — J069 Acute upper respiratory infection, unspecified: Secondary | ICD-10-CM | POA: Insufficient documentation

## 2016-09-23 DIAGNOSIS — O2230 Deep phlebothrombosis in pregnancy, unspecified trimester: Secondary | ICD-10-CM

## 2016-09-23 DIAGNOSIS — E876 Hypokalemia: Secondary | ICD-10-CM | POA: Diagnosis not present

## 2016-09-23 DIAGNOSIS — O99511 Diseases of the respiratory system complicating pregnancy, first trimester: Secondary | ICD-10-CM | POA: Diagnosis not present

## 2016-09-23 DIAGNOSIS — D72829 Elevated white blood cell count, unspecified: Secondary | ICD-10-CM | POA: Diagnosis not present

## 2016-09-23 DIAGNOSIS — R0602 Shortness of breath: Secondary | ICD-10-CM | POA: Diagnosis not present

## 2016-09-23 DIAGNOSIS — I959 Hypotension, unspecified: Secondary | ICD-10-CM | POA: Insufficient documentation

## 2016-09-23 DIAGNOSIS — O26891 Other specified pregnancy related conditions, first trimester: Secondary | ICD-10-CM | POA: Diagnosis not present

## 2016-09-23 DIAGNOSIS — R778 Other specified abnormalities of plasma proteins: Secondary | ICD-10-CM | POA: Insufficient documentation

## 2016-09-23 DIAGNOSIS — R748 Abnormal levels of other serum enzymes: Secondary | ICD-10-CM | POA: Diagnosis not present

## 2016-09-23 DIAGNOSIS — Z3490 Encounter for supervision of normal pregnancy, unspecified, unspecified trimester: Secondary | ICD-10-CM | POA: Diagnosis not present

## 2016-09-23 DIAGNOSIS — R7989 Other specified abnormal findings of blood chemistry: Secondary | ICD-10-CM

## 2016-09-23 DIAGNOSIS — R05 Cough: Secondary | ICD-10-CM | POA: Diagnosis not present

## 2016-09-23 LAB — URINE DRUG SCREEN, QUALITATIVE (ARMC ONLY)
Amphetamines, Ur Screen: NOT DETECTED
BARBITURATES, UR SCREEN: NOT DETECTED
Benzodiazepine, Ur Scrn: NOT DETECTED
CANNABINOID 50 NG, UR ~~LOC~~: NOT DETECTED
COCAINE METABOLITE, UR ~~LOC~~: NOT DETECTED
MDMA (ECSTASY) UR SCREEN: NOT DETECTED
Methadone Scn, Ur: NOT DETECTED
Opiate, Ur Screen: NOT DETECTED
PHENCYCLIDINE (PCP) UR S: NOT DETECTED
TRICYCLIC, UR SCREEN: NOT DETECTED

## 2016-09-23 LAB — CBC
HCT: 34 % — ABNORMAL LOW (ref 35.0–47.0)
Hemoglobin: 11.3 g/dL — ABNORMAL LOW (ref 12.0–16.0)
MCH: 26.7 pg (ref 26.0–34.0)
MCHC: 33.1 g/dL (ref 32.0–36.0)
MCV: 80.7 fL (ref 80.0–100.0)
PLATELETS: 238 10*3/uL (ref 150–440)
RBC: 4.21 MIL/uL (ref 3.80–5.20)
RDW: 15.8 % — ABNORMAL HIGH (ref 11.5–14.5)
WBC: 11.6 10*3/uL — AB (ref 3.6–11.0)

## 2016-09-23 LAB — COMPREHENSIVE METABOLIC PANEL
ALK PHOS: 58 U/L (ref 38–126)
ALT: 16 U/L (ref 14–54)
AST: 24 U/L (ref 15–41)
Albumin: 3.9 g/dL (ref 3.5–5.0)
Anion gap: 9 (ref 5–15)
BUN: 11 mg/dL (ref 6–20)
CALCIUM: 9.4 mg/dL (ref 8.9–10.3)
CHLORIDE: 105 mmol/L (ref 101–111)
CO2: 22 mmol/L (ref 22–32)
CREATININE: 0.57 mg/dL (ref 0.44–1.00)
Glucose, Bld: 133 mg/dL — ABNORMAL HIGH (ref 65–99)
Potassium: 3.3 mmol/L — ABNORMAL LOW (ref 3.5–5.1)
SODIUM: 136 mmol/L (ref 135–145)
Total Bilirubin: 0.3 mg/dL (ref 0.3–1.2)
Total Protein: 8.1 g/dL (ref 6.5–8.1)

## 2016-09-23 LAB — TROPONIN I
Troponin I: 0.06 ng/mL (ref <0.03)
Troponin I: <0.03 ng/mL (ref <0.03)

## 2016-09-23 LAB — FIBRIN DERIVATIVES D-DIMER (ARMC ONLY): Fibrin derivatives D-dimer (ARMC): 1479 — ABNORMAL HIGH (ref 0–499)

## 2016-09-23 MED ORDER — ACETAMINOPHEN 325 MG PO TABS
650.0000 mg | ORAL_TABLET | Freq: Four times a day (QID) | ORAL | Status: DC | PRN
  Administered 2016-09-23: 650 mg via ORAL
  Filled 2016-09-23: qty 2

## 2016-09-23 MED ORDER — PANTOPRAZOLE SODIUM 40 MG PO TBEC
40.0000 mg | DELAYED_RELEASE_TABLET | Freq: Every day | ORAL | Status: DC
  Administered 2016-09-24: 40 mg via ORAL
  Filled 2016-09-23: qty 1

## 2016-09-23 MED ORDER — POTASSIUM CHLORIDE 20 MEQ PO PACK
PACK | ORAL | Status: AC
  Filled 2016-09-23: qty 1

## 2016-09-23 MED ORDER — ALBUTEROL SULFATE (2.5 MG/3ML) 0.083% IN NEBU
INHALATION_SOLUTION | RESPIRATORY_TRACT | Status: AC
  Administered 2016-09-23: 2.5 mg via RESPIRATORY_TRACT
  Filled 2016-09-23: qty 3

## 2016-09-23 MED ORDER — ALBUTEROL SULFATE (2.5 MG/3ML) 0.083% IN NEBU
2.5000 mg | INHALATION_SOLUTION | Freq: Once | RESPIRATORY_TRACT | Status: AC
  Administered 2016-09-23: 2.5 mg via RESPIRATORY_TRACT
  Filled 2016-09-23: qty 3

## 2016-09-23 MED ORDER — ACETAMINOPHEN 650 MG RE SUPP: 650.0000 mg | Freq: Four times a day (QID) | RECTAL | Status: DC | PRN

## 2016-09-23 MED ORDER — ONDANSETRON HCL 4 MG/2ML IJ SOLN
4.0000 mg | Freq: Four times a day (QID) | INTRAMUSCULAR | Status: DC | PRN
  Filled 2016-09-23: qty 2

## 2016-09-23 MED ORDER — ONDANSETRON HCL 4 MG PO TABS: 4.0000 mg | ORAL_TABLET | Freq: Four times a day (QID) | ORAL | Status: DC | PRN

## 2016-09-23 MED ORDER — SODIUM CHLORIDE 0.9% FLUSH
3.0000 mL | Freq: Two times a day (BID) | INTRAVENOUS | Status: DC
  Administered 2016-09-23 – 2016-09-24 (×2): 3 mL via INTRAVENOUS

## 2016-09-23 MED ORDER — DOCUSATE SODIUM 100 MG PO CAPS
100.0000 mg | ORAL_CAPSULE | Freq: Two times a day (BID) | ORAL | Status: DC
  Administered 2016-09-23 – 2016-09-24 (×2): 100 mg via ORAL
  Filled 2016-09-23 (×2): qty 1

## 2016-09-23 MED ORDER — POTASSIUM CHLORIDE IN NACL 20-0.9 MEQ/L-% IV SOLN
INTRAVENOUS | Status: DC
  Administered 2016-09-23: 23:00:00 via INTRAVENOUS
  Filled 2016-09-23 (×4): qty 1000

## 2016-09-23 MED ORDER — BISACODYL 10 MG RE SUPP: 10.0000 mg | Freq: Every day | RECTAL | Status: DC | PRN

## 2016-09-23 MED ORDER — POTASSIUM CHLORIDE 20 MEQ PO PACK
20.0000 meq | PACK | Freq: Once | ORAL | Status: AC
  Administered 2016-09-23: 20 meq via ORAL

## 2016-09-23 NOTE — ED Notes (Signed)
Given report to receiving RN

## 2016-09-23 NOTE — ED Triage Notes (Addendum)
Pt states that she has been sick with cold type s/s for a couple weeks, states today she was out in the cold a lot due to her job and states that she started feeling chest pressure and tightness in her chest with wheezing and coughing, pt states that she is [redacted] weeks pregnant and called her dr who told her to come to be seen, breath sounds clear in triage, pt coughing but states is non-productive

## 2016-09-23 NOTE — ED Notes (Signed)
Patient reports cold symptoms with increased shortness of breath and chest heaviness.  Patient reports being [redacted] weeks pregnant.  Patient reports being anemic with this pregnancy.

## 2016-09-23 NOTE — ED Notes (Signed)
MD in room to assess patient at this time.  Will continue to monitor.

## 2016-09-23 NOTE — ED Provider Notes (Signed)
[facility] Emergency Department Provider Note   ____________________________________________    I have reviewed the triage vital signs and the nursing notes.   HISTORY  Chief Complaint Chest Pain    HPI Sarah Edwards is a 26 y.o. female who presents with complaints of chest pressure which is constant. Patient reports over the last week she has been suffering from an upper respiratory infection. She works as a Secondary school teacher and today there were frozen pipes and so she was outside in the cold weather for multiple hours and was also walking up and down steps frequently. She thinks this is likely the cause of her chest pressure. She reports it is constant. She denies pleurisy. She reports she is [redacted] weeks pregnant. She denies calf pain or swelling. No recent travels. No history of blood clots. Followed by Dr. Marcelline Mates of OB/GYN   Past Medical History:  Diagnosis Date  . Abnormal uterine bleeding (AUB)   . Anxiety   . Chronic UTI   . Crohn disease ([place])   . Endometriosis   . IBS (irritable bowel syndrome)   . Painful menstrual periods   . SAB (spontaneous abortion) 01/06/2014    Patient Active Problem List   Diagnosis Date Noted  . Anemia 09/11/2016  . Left sided numbness 07/04/2016  . Left-sided weakness 07/04/2016  . Acid reflux 01/12/2016  . Episodic mood disorder ([place]) 01/12/2016  . Muscle spasms of neck 01/12/2016  . Crohn disease ([place]) 01/07/2015  . Endometriosis 01/07/2015  . Tobacco user 01/07/2015  . Anxiety 01/07/2015  . Abnormal uterine bleeding 01/07/2015  . SAB (spontaneous abortion) 01/07/2015  . Painful menstrual periods 01/07/2015    Past Surgical History:  Procedure Laterality Date  . COLONOSCOPY N/A 2010 and 2011   crohns  . ESOPHAGOGASTRODUODENOSCOPY ENDOSCOPY N/A 2010  . laproscopy  2012    Prior to Admission medications   Medication Sig Start Date End Date Taking? Authorizing Provider  Doxylamine-Pyridoxine  10-10 MG TBEC Take 10 mg by mouth daily. Patient not taking: Reported on 09/23/2016 08/20/16   Alanda Slim Defrancesco, MD  Iron-FA-B Cmp-C-Biot-Probiotic (FUSION PLUS) CAPS Take 1 capsule by mouth daily. Patient not taking: Reported on 09/23/2016 09/11/16   Melody Rockney Ghee, CNM  Prenatal Vit-Fe Fumarate-FA (PRENATAL MULTIVITAMIN) TABS tablet Take 1 tablet by mouth daily at 12 noon.    Historical Provider, MD     Allergies Azithromycin; Amoxicillin; Penicillins; and Sulfa antibiotics  Family History  Problem Relation Age of Onset  . Cancer Paternal Grandmother     breast   . Ovarian cancer Paternal Grandmother   . Cervical cancer Paternal Grandmother   . Lung cancer Paternal Grandfather   . Leukemia Maternal Grandfather   . Diabetes Mother   . Diabetes Father     Social History Social History  Substance Use Topics  . Smoking status: Former Smoker    Packs/day: 0.50    Types: Cigarettes    Quit date: 03/18/2015  . Smokeless tobacco: Never Used  . Alcohol use No     Comment: occas    Review of Systems  Constitutional: No fever/chills Eyes: No visual changes.  Cardiovascular:As above Respiratory: Denies shortness of breath. No history of asthma Gastrointestinal: No abdominal pain.  No nausea, no vomiting.   Genitourinary: Negative for dysuria. Vaginal bleeding Musculoskeletal: Negative for back pain. Calf pain Skin: Negative for rash. Neurological: Negative for headaches or weakness  10-point ROS otherwise negative.  ____________________________________________   PHYSICAL EXAM:  VITAL  SIGNS: ED Triage Vitals  Enc Vitals Group     BP 09/23/16 1656 127/73     Pulse Rate 09/23/16 1656 (!) 104     Resp 09/23/16 1656 18     Temp 09/23/16 1656 98.4 F (36.9 C)     Temp Source 09/23/16 1656 Oral     SpO2 09/23/16 1656 100 %     Weight 09/23/16 1657 137 lb (62.1 kg)     Height --      Head Circumference --      Peak Flow --      Pain Score 09/23/16 1657 5     Pain Loc  --      Pain Edu? --      Excl. in [place]? --     Constitutional: Alert and oriented. No acute distress. Pleasant and interactive Eyes: Conjunctivae are normal.  Nose: No congestion/rhinnorhea. Mouth/Throat: Mucous membranes are moist.    Cardiovascular: Tachycardia, regular rhythm. Grossly normal heart sounds.  Good peripheral circulation. Respiratory: Normal respiratory effort.  No retractions. Lungs CTAB. Gastrointestinal: Soft and nontender. No distention.  No CVA tenderness. Genitourinary: deferred Musculoskeletal: No lower extremity tenderness nor edema.  Warm and well perfused Neurologic:  Normal speech and language. No gross focal neurologic deficits are appreciated.  Skin:  Skin is warm, dry and intact. No rash noted. Psychiatric: Mood and affect are normal. Speech and behavior are normal.  ____________________________________________   LABS (all labs ordered are listed, but only abnormal results are displayed)  Labs Reviewed  CBC - Abnormal; Notable for the following:       Result Value   WBC 11.6 (*)    Hemoglobin 11.3 (*)    HCT 34.0 (*)    RDW 15.8 (*)    All other components within normal limits  COMPREHENSIVE METABOLIC PANEL - Abnormal; Notable for the following:    Potassium 3.3 (*)    Glucose, Bld 133 (*)    All other components within normal limits  TROPONIN I - Abnormal; Notable for the following:    Troponin I 0.06 (*)    All other components within normal limits   ____________________________________________  EKG  ED ECG REPORT I, Lavonia Drafts, the attending physician, personally viewed and interpreted this ECG.  Date: 09/23/2016  Rate: 106 Rhythm: Sinus tachycardia QRS Axis: normal Intervals: normal ST/T Wave abnormalities: normal Conduction Disturbances: none Narrative Interpretation: unremarkable  ____________________________________________  RADIOLOGY  none ____________________________________________   PROCEDURES  Procedure(s)  performed: No    Critical Care performed: yes  CRITICAL CARE Performed by: Lavonia Drafts   Total critical care time: 30 minutes  Critical care time was exclusive of separately billable procedures and treating other patients.  Critical care was necessary to treat or prevent imminent or life-threatening deterioration.  Critical care was time spent personally by me on the following activities: development of treatment plan with patient and/or surrogate as well as nursing, discussions with consultants, evaluation of patient's response to treatment, examination of patient, obtaining history from patient or surrogate, ordering and performing treatments and interventions, ordering and review of laboratory studies, ordering and review of radiographic studies, pulse oximetry and re-evaluation of patient's condition.  ____________________________________________   INITIAL IMPRESSION / ASSESSMENT AND PLAN / ED COURSE  Pertinent labs & imaging results that were available during my care of the patient were reviewed by me and considered in my medical decision making (see chart for details).  Patient is [redacted] weeks pregnant. She presents with chest pressure which is constant.  Her EKG demonstrates mild tachycardia. She denies pleurisy. No fevers. No significant cough. No recent travel. Does not appear consistent with PE. ACS extremely unlikely at her age. Myocarditis is a possibility given recent URI.  Given her continued chest discomfort we will check labs and reevaluate   Clinical Course   Patient with elevated troponin of 0.06. Discussed this with Dr. Saralyn Pilar of cardiology. He recommends observation, echocardiogram and pain control.   Discussed with Dr. Marcelline Mates, she agrees with admission to medicine.  I suspect Myocarditis given week of URI symptoms. Patient will require admission for further evaluation and management ____________________________________________   FINAL CLINICAL  IMPRESSION(S) / ED DIAGNOSES  Final diagnoses:  Chest pain, unspecified type      NEW MEDICATIONS STARTED DURING THIS VISIT:  New Prescriptions   No medications on file     Note:  This document was prepared using Dragon voice recognition software and may include unintentional dictation errors.    Lavonia Drafts, MD 09/23/16 Joen Laura

## 2016-09-23 NOTE — H&P (Signed)
History and Physical    [name] HYI:[id number] DOB: 01/20/1991 DOA: 09/23/2016  Referring physician: Dr. Corky Downs PCP: Halina Maidens, MD  Specialists: Dr. Marcelline Mates  Chief Complaint: Chest pain  HPI: Sarah Edwards is a 26 y.o. female has a past medical history significant for pregnancy at 12 weeks now with 1 week hx of chest pressure. Troponin mildly elevated in ER. EKG OK. No cardiac hx. Denies N/V or SOB. No cough. No LE edema. She is now admitted.  Review of Systems: The patient denies anorexia, fever, weight loss,, vision loss, decreased hearing, hoarseness, syncope, dyspnea on exertion, peripheral edema, balance deficits, hemoptysis, abdominal pain, melena, hematochezia, severe indigestion/heartburn, hematuria, incontinence, genital sores, muscle weakness, suspicious skin lesions, transient blindness, difficulty walking, depression, unusual weight change, abnormal bleeding, enlarged lymph nodes, angioedema, and breast masses.   Past Medical History:  Diagnosis Date  . Abnormal uterine bleeding (AUB)   . Anxiety   . Chronic UTI   . Crohn disease ([place])   . Endometriosis   . IBS (irritable bowel syndrome)   . Painful menstrual periods   . SAB (spontaneous abortion) 01/06/2014   Past Surgical History:  Procedure Laterality Date  . COLONOSCOPY N/A 2010 and 2011   crohns  . ESOPHAGOGASTRODUODENOSCOPY ENDOSCOPY N/A 2010  . laproscopy  2012   Social History:  reports that she quit smoking about 18 months ago. Her smoking use included Cigarettes. She smoked 0.50 packs per day. She has never used smokeless tobacco. She reports that she does not drink alcohol or use drugs.  Allergies  Allergen Reactions  . Azithromycin Swelling  . Amoxicillin Rash  . Penicillins Rash    Childhood reaction  . Sulfa Antibiotics Nausea And Vomiting    Constant vomiting    Family History  Problem Relation Age of Onset  . Cancer Paternal Grandmother     breast   . Ovarian cancer Paternal  Grandmother   . Cervical cancer Paternal Grandmother   . Lung cancer Paternal Grandfather   . Leukemia Maternal Grandfather   . Diabetes Mother   . Diabetes Father     Prior to Admission medications   Medication Sig Start Date End Date Taking? Authorizing Provider  Doxylamine-Pyridoxine 10-10 MG TBEC Take 10 mg by mouth daily. Patient not taking: Reported on 09/23/2016 08/20/16   Alanda Slim Defrancesco, MD  Iron-FA-B Cmp-C-Biot-Probiotic (FUSION PLUS) CAPS Take 1 capsule by mouth daily. Patient not taking: Reported on 09/23/2016 09/11/16   Melody Rockney Ghee, CNM  Prenatal Vit-Fe Fumarate-FA (PRENATAL MULTIVITAMIN) TABS tablet Take 1 tablet by mouth daily at 12 noon.    Historical Provider, MD   Physical Exam: Vitals:   09/23/16 1656 09/23/16 1657 09/23/16 1829  BP: 127/73  125/65  Pulse: (!) 104  (!) 111  Resp: 18  18  Temp: 98.4 F (36.9 C)    TempSrc: Oral    SpO2: 100%  100%  Weight:  62.1 kg (137 lb)      General:  No apparent distress, WDWN, [place]/AT  Eyes: PERRL, EOMI, no scleral icterus, conjunctiva clear  ENT: moist oropharynx without exudate, TM's benign, dentition good  Neck: supple, no lymphadenopathy. No bruits or thyromegaly  Cardiovascular: regular rate without MRG; 2+ peripheral pulses, no JVD, no peripheral edema  Respiratory: CTA biL, good air movement without wheezing, rhonchi or crackled. Respiratory effort normal  Abdomen: soft, non tender to palpation, positive bowel sounds, no guarding, no rebound  Skin: no rashes or lesions  Musculoskeletal: normal bulk and  tone, no joint swelling  Psychiatric: normal mood and affect, A&OX3  Neurologic: CN 2-12 grossly intact, Motor strength 5/5 in all 4 groups with symmetric DTR's and non-focal sensory exam  Labs on Admission:  Basic Metabolic Panel:  Recent Labs Lab 09/23/16 1806  NA 136  K 3.3*  CL 105  CO2 22  GLUCOSE 133*  BUN 11  CREATININE 0.57  CALCIUM 9.4   Liver Function Tests:  Recent  Labs Lab 09/23/16 1806  AST 24  ALT 16  ALKPHOS 58  BILITOT 0.3  PROT 8.1  ALBUMIN 3.9   No results for input(s): LIPASE, AMYLASE in the last 168 hours. No results for input(s): AMMONIA in the last 168 hours. CBC:  Recent Labs Lab 09/23/16 1806  WBC 11.6*  HGB 11.3*  HCT 34.0*  MCV 80.7  PLT 238   Cardiac Enzymes:  Recent Labs Lab 09/23/16 1806  TROPONINI 0.06*    BNP (last 3 results) No results for input(s): BNP in the last 8760 hours.  ProBNP (last 3 results) No results for input(s): PROBNP in the last 8760 hours.  CBG: No results for input(s): GLUCAP in the last 168 hours.  Radiological Exams on Admission: No results found.  EKG: Independently reviewed.  Assessment/Plan Principal Problem:   Chest pain Active Problems:   Elevated troponin   Pregnancy   Hypokalemia   Will observe on floor with telemetry. Follow enzymes. Check drug screen and D-dimer. [place] of LE. Consult Cardiology and order echo. Consider CTA to r/o PE if above w/u negative.  Diet: regular Fluids: NS@75  DVT Prophylaxis: TED hose  Code Status: FULL  Family Communication: none  Disposition Plan: home  Time spent: 50 min

## 2016-09-24 ENCOUNTER — Encounter: Payer: Self-pay | Admitting: Nurse Practitioner

## 2016-09-24 ENCOUNTER — Telehealth: Payer: Self-pay | Admitting: Certified Nurse Midwife

## 2016-09-24 ENCOUNTER — Observation Stay (HOSPITAL_BASED_OUTPATIENT_CLINIC_OR_DEPARTMENT_OTHER)
Admit: 2016-09-24 | Discharge: 2016-09-24 | Disposition: A | Discharge status: Home / Self Care | Payer: 59 | Attending: Internal Medicine | Admitting: Internal Medicine

## 2016-09-24 DIAGNOSIS — R079 Chest pain, unspecified: Secondary | ICD-10-CM | POA: Diagnosis not present

## 2016-09-24 DIAGNOSIS — R748 Abnormal levels of other serum enzymes: Secondary | ICD-10-CM

## 2016-09-24 DIAGNOSIS — J069 Acute upper respiratory infection, unspecified: Secondary | ICD-10-CM | POA: Diagnosis not present

## 2016-09-24 DIAGNOSIS — R0600 Dyspnea, unspecified: Secondary | ICD-10-CM

## 2016-09-24 DIAGNOSIS — Z3A11 11 weeks gestation of pregnancy: Secondary | ICD-10-CM | POA: Diagnosis not present

## 2016-09-24 DIAGNOSIS — O26891 Other specified pregnancy related conditions, first trimester: Secondary | ICD-10-CM | POA: Diagnosis not present

## 2016-09-24 DIAGNOSIS — M7989 Other specified soft tissue disorders: Secondary | ICD-10-CM | POA: Diagnosis not present

## 2016-09-24 DIAGNOSIS — R0789 Other chest pain: Secondary | ICD-10-CM | POA: Diagnosis not present

## 2016-09-24 DIAGNOSIS — I959 Hypotension, unspecified: Secondary | ICD-10-CM

## 2016-09-24 DIAGNOSIS — O99511 Diseases of the respiratory system complicating pregnancy, first trimester: Secondary | ICD-10-CM | POA: Diagnosis not present

## 2016-09-24 DIAGNOSIS — R778 Other specified abnormalities of plasma proteins: Secondary | ICD-10-CM | POA: Diagnosis not present

## 2016-09-24 LAB — CBC
HCT: 30.6 % — ABNORMAL LOW (ref 35.0–47.0)
Hemoglobin: 10.2 g/dL — ABNORMAL LOW (ref 12.0–16.0)
MCH: 26.8 pg (ref 26.0–34.0)
MCHC: 33.3 g/dL (ref 32.0–36.0)
MCV: 80.6 fL (ref 80.0–100.0)
PLATELETS: 196 10*3/uL (ref 150–440)
RBC: 3.8 MIL/uL (ref 3.80–5.20)
RDW: 15.6 % — AB (ref 11.5–14.5)
WBC: 7.1 10*3/uL (ref 3.6–11.0)

## 2016-09-24 LAB — ECHOCARDIOGRAM COMPLETE
HEIGHTINCHES: 64 in
Weight: 2204.8 oz

## 2016-09-24 LAB — COMPREHENSIVE METABOLIC PANEL
ALT: 15 U/L (ref 14–54)
AST: 18 U/L (ref 15–41)
Albumin: 3.2 g/dL — ABNORMAL LOW (ref 3.5–5.0)
Alkaline Phosphatase: 43 U/L (ref 38–126)
Anion gap: 5 (ref 5–15)
BUN: 10 mg/dL (ref 6–20)
CHLORIDE: 108 mmol/L (ref 101–111)
CO2: 22 mmol/L (ref 22–32)
CREATININE: 0.42 mg/dL — AB (ref 0.44–1.00)
Calcium: 8.3 mg/dL — ABNORMAL LOW (ref 8.9–10.3)
GFR calc Af Amer: >60 mL/min (ref >60)
GFR calc non Af Amer: >60 mL/min (ref >60)
GLUCOSE: 79 mg/dL (ref 65–99)
Potassium: 3.6 mmol/L (ref 3.5–5.1)
SODIUM: 135 mmol/L (ref 135–145)
Total Bilirubin: 0.3 mg/dL (ref 0.3–1.2)
Total Protein: 6.5 g/dL (ref 6.5–8.1)

## 2016-09-24 LAB — BLOOD GAS, ARTERIAL
ACID-BASE DEFICIT: 1.8 mmol/L (ref 0.0–2.0)
ALLENS TEST (PASS/FAIL): POSITIVE — AB
Bicarbonate: 21.7 mmol/L (ref 20.0–28.0)
FIO2: 0.21
O2 SAT: 98.2 %
PATIENT TEMPERATURE: 37
PCO2 ART: 32 mmHg (ref 32.0–48.0)
pH, Arterial: 7.44 (ref 7.350–7.450)
pO2, Arterial: 104 mmHg (ref 83.0–108.0)

## 2016-09-24 LAB — TROPONIN I: Troponin I: <0.03 ng/mL (ref <0.03)

## 2016-09-24 MED ORDER — VITAMIN B-6 50 MG PO TABS
25.0000 mg | ORAL_TABLET | Freq: Three times a day (TID) | ORAL | Status: DC | PRN
  Administered 2016-09-24 (×2): 25 mg via ORAL
  Filled 2016-09-24 (×4): qty 0.5

## 2016-09-24 NOTE — Progress Notes (Signed)
Ambulated patient in the hallway and monitored SPO2. Her SPO2 maintained at 97-100 % on room air.

## 2016-09-24 NOTE — Progress Notes (Signed)
         To Whom It May Concern:         Mrs. Sarah Edwards was hospitalized at [facility] from 09/23/2016 through 09/24/2016. She is to return back to work at full capacity on 09/26/2016. Thank you for your understanding.        Sincerely,  Theodoro Grist, MD

## 2016-09-24 NOTE — Discharge Summary (Signed)
[facility] [place] NAME: Sarah Edwards    MR#:  [id number]  DATE OF BIRTH:  Nov 05, 1990  DATE OF ADMISSION:  09/23/2016 ADMITTING PHYSICIAN: Idelle Crouch, MD  DATE OF DISCHARGE: No discharge date for patient encounter.  PRIMARY CARE PHYSICIAN: Halina Maidens, MD     ADMISSION DIAGNOSIS:  DVT complicating pregnancy ([place]) [O22.30, I82.409] Chest pain, unspecified type [R07.9]  DISCHARGE DIAGNOSIS:  Principal Problem:   Chest pain Active Problems:   Elevated troponin   Dyspnea   Pregnancy   Hypokalemia   Hypotension   SECONDARY DIAGNOSIS:   Past Medical History:  Diagnosis Date  . Abnormal uterine bleeding (AUB)   . Anxiety   . Chronic UTI   . Crohn disease ([place])   . Endometriosis   . IBS (irritable bowel syndrome)   . Painful menstrual periods   . SAB (spontaneous abortion) 01/06/2014    .pro HOSPITAL COURSE:   Patient is 26 year old Caucasian female with past medical history significant for history of lower extremity swelling during her prior pregnancy about 5 years ago, anxiety, frequent UTIs, Crohn's disease, endometriosis, irritable bowel syndrome, who presents to the hospital with complaints of chest pain. According to the patient, she was doing well up until approximately 1 week ago when she started having cold symptoms, sinus congestion, yesterday she started coughing again developed chest pain which was in the middle of her chest, worsening with cough but not respiration, so position. She decided to come to emergency room for further evaluation, where she was found to have mild elevation of troponin. Hospitalist services were contacted for admission. Patient denies any shortness of breath, although admits of having some shortness of breath on exertion for the past few years, especially whenever she climbs stairs. She was walked in the hospital and her O2 sats remained stable at 97-100% on exertion on room air,  More over her PO2 was 104 on the room air on ABGs. Cardiology consultation was requested, echocardiogram was performed which was unremarkable per cardiology. Cardiologist felt that patient's chest pain was likely non-cardiac, possibly related to upper respiratory infection. Doppler ultrasound was negative for DVT. It was felt that patient is stable to be discharged home today Discussion by problem: 1. Chest pain with elevated troponin, etiology is likely upper respiratory infection per cardiology, unlikely acute coronary syndrome, echocardiogram was unremarkable,  normal ABGs, Doppler ultrasound of lower extremities was negative for DVT.    #2. Dyspnea, no obvious lung disease, echocardiogram was normal, O2 sats normal at 97-100% on room air on exertion, ABGs normal. The patient reported some wheezing prior to development of chest pain, I'm concerned about possible reactive airway disease, the patient may benefit from PFTs as outpatient.  #3 . Minimal lower extremity swelling, no DVT on ultrasound #4 hypotension, recheck blood pressure readings as outpatient #5. Pregnancy with a intermittent nausea, continue B6, supportive therapy, no other medications were recommended #6. Hypokalemia, resolved #7. Leukocytosis, resolved, no obvious infection  #8. Hyperglycemia, fasting blood glucose level is 79, no diabetes   DISCHARGE CONDITIONS:   Stable  CONSULTS OBTAINED:  Treatment Team:  Wellington Hampshire, MD  DRUG ALLERGIES:   Allergies  Allergen Reactions  . Azithromycin Swelling  . Amoxicillin Rash  . Penicillins Rash    Childhood reaction  . Sulfa Antibiotics Nausea And Vomiting    Constant vomiting    DISCHARGE MEDICATIONS:   Current Discharge Medication List    CONTINUE these medications which have  NOT CHANGED   Details  Doxylamine-Pyridoxine 10-10 MG TBEC Take 10 mg by mouth daily. Qty: 60 tablet, Refills: 1    Iron-FA-B Cmp-C-Biot-Probiotic (FUSION PLUS) CAPS Take 1 capsule by  mouth daily. Qty: 60 capsule, Refills: 1    Prenatal Vit-Fe Fumarate-FA (PRENATAL MULTIVITAMIN) TABS tablet Take 1 tablet by mouth daily at 12 noon.         DISCHARGE INSTRUCTIONS:    Patient is to follow-up with primary care physician within one week after discharge  If you experience worsening of your admission symptoms, develop shortness of breath, life threatening emergency, suicidal or homicidal thoughts you must seek medical attention immediately by calling 911 or calling your MD immediately  if symptoms less severe.  You Must read complete instructions/literature along with all the possible adverse reactions/side effects for all the Medicines you take and that have been prescribed to you. Take any new Medicines after you have completely understood and accept all the possible adverse reactions/side effects.   Please note  You were cared for by a hospitalist during your hospital stay. If you have any questions about your discharge medications or the care you received while you were in the hospital after you are discharged, you can call the unit and asked to speak with the hospitalist on call if the hospitalist that took care of you is not available. Once you are discharged, your primary care physician will handle any further medical issues. Please note that NO REFILLS for any discharge medications will be authorized once you are discharged, as it is imperative that you return to your primary care physician (or establish a relationship with a primary care physician if you do not have one) for your aftercare needs so that they can reassess your need for medications and monitor your lab values.    Today   CHIEF COMPLAINT:   Chief Complaint  Patient presents with  . Chest Pain    HISTORY OF PRESENT ILLNESS:  Sarah Edwards  is a 26 y.o. female with a known history of  lower extremity swelling during her prior pregnancy about 5 years ago, anxiety, frequent UTIs, Crohn's disease,  endometriosis, irritable bowel syndrome, who presents to the hospital with complaints of chest pain. According to the patient, she was doing well up until approximately 1 week ago when she started having cold symptoms, sinus congestion, yesterday she started coughing again developed chest pain which was in the middle of her chest, worsening with cough but not respiration, so position. She decided to come to emergency room for further evaluation, where she was found to have mild elevation of troponin. Hospitalist services were contacted for admission. Patient denies any shortness of breath, although admits of having some shortness of breath on exertion for the past few years, especially whenever she climbs stairs. She was walked in the hospital and her O2 sats remained stable at 97-100% on exertion on room air, More over her PO2 was 104 on the room air on ABGs. Cardiology consultation was requested, echocardiogram was performed which was unremarkable per cardiology. Cardiologist felt that patient's chest pain was likely non-cardiac, possibly related to upper respiratory infection. Doppler ultrasound was negative for DVT. It was felt that patient is stable to be discharged home today Discussion by problem: 1. Chest pain with elevated troponin, etiology is likely upper respiratory infection per cardiology, unlikely acute coronary syndrome, echocardiogram was unremarkable,  normal ABGs, Doppler ultrasound of lower extremities was negative for DVT.    #2. Dyspnea, no obvious lung  disease, echocardiogram was normal, O2 sats normal at 97-100% on room air on exertion, ABGs normal. The patient reported some wheezing prior to development of chest pain, I'm concerned about possible reactive airway disease, the patient may benefit from PFTs as outpatient.  #3 . Minimal lower extremity swelling, no DVT on ultrasound #4 hypotension, recheck blood pressure readings as outpatient #5. Pregnancy with a intermittent nausea,  continue B6, supportive therapy, no other medications were recommended #6. Hypokalemia, resolved #7. Leukocytosis, resolved, no obvious infection  #8. Hyperglycemia, fasting blood glucose level is 79, no diabetes     VITAL SIGNS:  Blood pressure 96/66, pulse 92, temperature 99 F (37.2 C), temperature source Oral, resp. rate 18, height 5' 4"  (1.626 m), weight 62.5 kg (137 lb 12.8 oz), last menstrual period 07/11/2016, SpO2 99 %.  I/O:   Intake/Output Summary (Last 24 hours) at 09/24/16 1711 Last data filed at 09/24/16 1013  Gross per 24 hour  Intake              900 ml  Output              950 ml  Net              -50 ml    PHYSICAL EXAMINATION:  GENERAL:  26 y.o.-year-old patient lying in the bed with no acute distress.  EYES: Pupils equal, round, reactive to light and accommodation. No scleral icterus. Extraocular muscles intact.  HEENT: Head atraumatic, normocephalic. Oropharynx and nasopharynx clear.  NECK:  Supple, no jugular venous distention. No thyroid enlargement, no tenderness.  LUNGS: Normal breath sounds bilaterally, no wheezing, rales,rhonchi or crepitation. No use of accessory muscles of respiration.  CARDIOVASCULAR: S1, S2 normal. No murmurs, rubs, or gallops.  ABDOMEN: Soft, non-tender, non-distended. Bowel sounds present. No organomegaly or mass.  EXTREMITIES: No pedal edema, cyanosis, or clubbing.  NEUROLOGIC: Cranial nerves II through XII are intact. Muscle strength 5/5 in all extremities. Sensation intact. Gait not checked.  PSYCHIATRIC: The patient is alert and oriented x 3.  SKIN: No obvious rash, lesion, or ulcer.   DATA REVIEW:   CBC  Recent Labs Lab 09/24/16 0419  WBC 7.1  HGB 10.2*  HCT 30.6*  PLT 196    Chemistries   Recent Labs Lab 09/24/16 0419  NA 135  K 3.6  CL 108  CO2 22  GLUCOSE 79  BUN 10  CREATININE 0.42*  CALCIUM 8.3*  AST 18  ALT 15  ALKPHOS 43  BILITOT 0.3    Cardiac Enzymes  Recent Labs Lab 09/24/16 1022   TROPONINI <0.03    Microbiology Results  Results for orders placed or performed in visit on 08/31/16  Culture, OB Urine     Status: None   Collection Time: 08/31/16  9:39 AM  Result Value Ref Range Status   Urine Culture, OB Final report  Final  GC/Chlamydia Probe Amp     Status: None   Collection Time: 08/31/16  9:39 AM  Result Value Ref Range Status   Chlamydia trachomatis, NAA Negative Negative Final   Neisseria gonorrhoeae by PCR Negative Negative Final  Result     Status: None   Collection Time: 08/31/16  9:39 AM  Result Value Ref Range Status   Result 1 No growth  Final    RADIOLOGY:  Dg Chest 2 View  Result Date: 09/23/2016 CLINICAL DATA:  Patient is [redacted] weeks pregnant. She is having upper left sided chest tightness. She has been having a range of cold  and flu symptoms. Cough, congestion, and vomiting started today with the chest pain. Hx chrons disease, former smoker 2016 EXAM: CHEST  2 VIEW COMPARISON:  09/30/2013 FINDINGS: The heart size and mediastinal contours are within normal limits. Both lungs are clear. No pleural effusion or pneumothorax. The visualized skeletal structures are unremarkable. IMPRESSION: Normal chest radiographs. Electronically Signed   By: Lajean Manes M.D.   On: 09/23/2016 19:57   US Venous Img Lower Bilateral  Result Date: 09/23/2016 CLINICAL DATA:  Pregnant patient with shortness of breath and chest pressure EXAM: BILATERAL LOWER EXTREMITY VENOUS DUPLEX ULTRASOUND TECHNIQUE: Gray-scale sonography with graded compression, as well as color Doppler and duplex ultrasound were performed to evaluate the lower extremity deep venous systems from the level of the common femoral vein and including the common femoral, femoral, profunda femoral, popliteal and calf veins including the posterior tibial, peroneal and gastrocnemius veins when visible. The superficial great saphenous vein was also interrogated. Spectral Doppler was utilized to evaluate flow at rest and  with distal augmentation maneuvers in the common femoral, femoral and popliteal veins. COMPARISON:  None. FINDINGS: RIGHT LOWER EXTREMITY Common Femoral Vein: No evidence of thrombus. Normal compressibility, respiratory phasicity and response to augmentation. Saphenofemoral Junction: No evidence of thrombus. Normal compressibility and flow on color Doppler imaging. Profunda Femoral Vein: No evidence of thrombus. Normal compressibility and flow on color Doppler imaging. Femoral Vein: No evidence of thrombus. Normal compressibility, respiratory phasicity and response to augmentation. Popliteal Vein: No evidence of thrombus. Normal compressibility, respiratory phasicity and response to augmentation. Calf Veins: No evidence of thrombus. Normal compressibility and flow on color Doppler imaging. Superficial Great Saphenous Vein: No evidence of thrombus. Normal compressibility and flow on color Doppler imaging. Venous Reflux:  None. Other Findings:  None. LEFT LOWER EXTREMITY Common Femoral Vein: No evidence of thrombus. Normal compressibility, respiratory phasicity and response to augmentation. Saphenofemoral Junction: No evidence of thrombus. Normal compressibility and flow on color Doppler imaging. Profunda Femoral Vein: No evidence of thrombus. Normal compressibility and flow on color Doppler imaging. Femoral Vein: No evidence of thrombus. Normal compressibility, respiratory phasicity and response to augmentation. Popliteal Vein: No evidence of thrombus. Normal compressibility, respiratory phasicity and response to augmentation. Calf Veins: No evidence of thrombus. Normal compressibility and flow on color Doppler imaging. Superficial Great Saphenous Vein: No evidence of thrombus. Normal compressibility and flow on color Doppler imaging. Venous Reflux:  None. Other Findings:  None. IMPRESSION: No evidence of deep venous thrombosis in either lower extremity. Electronically Signed   By: Lowella Grip III M.D.   On:  09/23/2016 20:51    EKG:   Orders placed or performed during the hospital encounter of 09/23/16  . EKG 12-Lead  . EKG 12-Lead      Management plans discussed with the patient, family and they are in agreement.  CODE STATUS:     Code Status Orders        Start     Ordered   09/23/16 2227  Full code  Continuous     09/23/16 2226    Code Status History    Date Active Date Inactive Code Status Order ID Comments User Context   This patient has a current code status but no historical code status.      TOTAL TIME TAKING CARE OF THIS PATIENT: 40 minutes.    Theodoro Grist M.D on 09/24/2016 at 5:11 PM  Between 7am to 6pm - Pager - [phone]  After 6pm go to [email or web address] - [place]  [facility]  Office  [phone]  CC: Primary care physician; Halina Maidens, MD

## 2016-09-24 NOTE — Telephone Encounter (Signed)
Pt is [redacted] wk pregnant and is in hospital because her troponin is high. They haven't checked the baby and she is worried about it. Can you give her a call.

## 2016-09-24 NOTE — Progress Notes (Signed)
Patient discharged via wheelchair and private vehicle. IV removed and catheter intact. All discharge instructions given and patient verbalizes understanding. Tele removed and returned. No prescriptions given to patient No distress noted.

## 2016-09-24 NOTE — Progress Notes (Signed)
[facility] [place] NAME: Sarah Edwards    MR#:  [id number]  DATE OF BIRTH:  Jan 20, 1991  SUBJECTIVE:  CHIEF COMPLAINT:   Chief Complaint  Patient presents with  . Chest Pain   Patient is 26 year old Caucasian female with past medical history significant for history of lower extremity swelling during her prior pregnancy about 5 years ago, anxiety, frequent UTIs, Crohn's disease, endometriosis, irritable bowel syndrome, who presents to the hospital with complaints of chest pain. According to the patient, she was doing well up until approximately 1 week ago when she started having cold symptoms, sinus congestion, yesterday she started coughing again developed chest pain which was in the middle of her chest, worsening with cough but not respiration, so position. She decided to come to emergency room for further evaluation, where she was found to have mild elevation of troponin. Hospitalist services were contacted for admission. Patient denies any shortness of breath, although admits of having some shortness of breath on exertion for the past few years, especially whenever she climbs stairs. She was walked in the hospital and her O2 sats remained stable at 97-100% on exertion on room air. Cardiology consultation is pending. Doppler ultrasound was negative for DVT  Review of Systems  Constitutional: Negative for chills, fever and weight loss.  HENT: Negative for congestion.   Eyes: Negative for blurred vision and double vision.  Respiratory: Positive for cough and shortness of breath. Negative for sputum production and wheezing.   Cardiovascular: Positive for chest pain. Negative for palpitations, orthopnea, leg swelling and PND.  Gastrointestinal: Negative for abdominal pain, blood in stool, constipation, diarrhea, nausea and vomiting.  Genitourinary: Negative for dysuria, frequency, hematuria and urgency.  Musculoskeletal: Negative for falls.   Neurological: Negative for dizziness, tremors, focal weakness and headaches.  Endo/Heme/Allergies: Does not bruise/bleed easily.  Psychiatric/Behavioral: Negative for depression. The patient does not have insomnia.     VITAL SIGNS: Blood pressure 96/66, pulse 92, temperature 99 F (37.2 C), temperature source Oral, resp. rate 18, height 5' 4"  (1.626 m), weight 62.5 kg (137 lb 12.8 oz), last menstrual period 07/11/2016, SpO2 99 %.  PHYSICAL EXAMINATION:   GENERAL:  26 y.o.-year-old patient lying in the bed with no acute distress.  EYES: Pupils equal, round, reactive to light and accommodation. No scleral icterus. Extraocular muscles intact.  HEENT: Head atraumatic, normocephalic. Oropharynx and nasopharynx clear.  NECK:  Supple, no jugular venous distention. No thyroid enlargement, no tenderness.  LUNGS: Normal breath sounds bilaterally, no wheezing, rales,rhonchi or crepitation. No use of accessory muscles of respiration.  CARDIOVASCULAR: S1, S2 normal. No murmurs, rubs, or gallops.  ABDOMEN: Soft, nontender, nondistended. Bowel sounds present. No organomegaly or mass.  EXTREMITIES trace lower extremity andedal edema, , no cyanosis, or clubbing.  NEUROLOGIC: Cranial nerves II through XII are intact. Muscle strength 5/5 in all extremities. Sensation intact. Gait not checked.  PSYCHIATRIC: The patient is alert and oriented x 3.  SKIN: No obvious rash, lesion, or ulcer.   ORDERS/RESULTS REVIEWED:   CBC  Recent Labs Lab 09/23/16 1806 09/24/16 0419  WBC 11.6* 7.1  HGB 11.3* 10.2*  HCT 34.0* 30.6*  PLT 238 196  MCV 80.7 80.6  MCH 26.7 26.8  MCHC 33.1 33.3  RDW 15.8* 15.6*   ------------------------------------------------------------------------------------------------------------------  Chemistries   Recent Labs Lab 09/23/16 1806 09/24/16 0419  NA 136 135  K 3.3* 3.6  CL 105 108  CO2 22 22  GLUCOSE 133* 79  BUN  11 10  CREATININE 0.57 0.42*  CALCIUM 9.4 8.3*  AST 24  18  ALT 16 15  ALKPHOS 58 43  BILITOT 0.3 0.3   ------------------------------------------------------------------------------------------------------------------ estimated creatinine clearance is 93.6 mL/min (by C-G formula based on SCr of 0.42 mg/dL (L)). ------------------------------------------------------------------------------------------------------------------ No results for input(s): TSH, T4TOTAL, T3FREE, THYROIDAB in the last 72 hours.  Invalid input(s): FREET3  Cardiac Enzymes  Recent Labs Lab 09/23/16 2253 09/24/16 0419 09/24/16 1022  TROPONINI <0.03 <0.03 <0.03   ------------------------------------------------------------------------------------------------------------------ Invalid input(s): POCBNP ---------------------------------------------------------------------------------------------------------------  RADIOLOGY: Dg Chest 2 View  Result Date: 09/23/2016 CLINICAL DATA:  Patient is [redacted] weeks pregnant. She is having upper left sided chest tightness. She has been having a range of cold and flu symptoms. Cough, congestion, and vomiting started today with the chest pain. Hx chrons disease, former smoker 2016 EXAM: CHEST  2 VIEW COMPARISON:  09/30/2013 FINDINGS: The heart size and mediastinal contours are within normal limits. Both lungs are clear. No pleural effusion or pneumothorax. The visualized skeletal structures are unremarkable. IMPRESSION: Normal chest radiographs. Electronically Signed   By: Lajean Manes M.D.   On: 09/23/2016 19:57   US Venous Img Lower Bilateral  Result Date: 09/23/2016 CLINICAL DATA:  Pregnant patient with shortness of breath and chest pressure EXAM: BILATERAL LOWER EXTREMITY VENOUS DUPLEX ULTRASOUND TECHNIQUE: Gray-scale sonography with graded compression, as well as color Doppler and duplex ultrasound were performed to evaluate the lower extremity deep venous systems from the level of the common femoral vein and including the common  femoral, femoral, profunda femoral, popliteal and calf veins including the posterior tibial, peroneal and gastrocnemius veins when visible. The superficial great saphenous vein was also interrogated. Spectral Doppler was utilized to evaluate flow at rest and with distal augmentation maneuvers in the common femoral, femoral and popliteal veins. COMPARISON:  None. FINDINGS: RIGHT LOWER EXTREMITY Common Femoral Vein: No evidence of thrombus. Normal compressibility, respiratory phasicity and response to augmentation. Saphenofemoral Junction: No evidence of thrombus. Normal compressibility and flow on color Doppler imaging. Profunda Femoral Vein: No evidence of thrombus. Normal compressibility and flow on color Doppler imaging. Femoral Vein: No evidence of thrombus. Normal compressibility, respiratory phasicity and response to augmentation. Popliteal Vein: No evidence of thrombus. Normal compressibility, respiratory phasicity and response to augmentation. Calf Veins: No evidence of thrombus. Normal compressibility and flow on color Doppler imaging. Superficial Great Saphenous Vein: No evidence of thrombus. Normal compressibility and flow on color Doppler imaging. Venous Reflux:  None. Other Findings:  None. LEFT LOWER EXTREMITY Common Femoral Vein: No evidence of thrombus. Normal compressibility, respiratory phasicity and response to augmentation. Saphenofemoral Junction: No evidence of thrombus. Normal compressibility and flow on color Doppler imaging. Profunda Femoral Vein: No evidence of thrombus. Normal compressibility and flow on color Doppler imaging. Femoral Vein: No evidence of thrombus. Normal compressibility, respiratory phasicity and response to augmentation. Popliteal Vein: No evidence of thrombus. Normal compressibility, respiratory phasicity and response to augmentation. Calf Veins: No evidence of thrombus. Normal compressibility and flow on color Doppler imaging. Superficial Great Saphenous Vein: No  evidence of thrombus. Normal compressibility and flow on color Doppler imaging. Venous Reflux:  None. Other Findings:  None. IMPRESSION: No evidence of deep venous thrombosis in either lower extremity. Electronically Signed   By: Lowella Grip III M.D.   On: 09/23/2016 20:51    EKG:  Orders placed or performed during the hospital encounter of 09/23/16  . EKG 12-Lead  . EKG 12-Lead    ASSESSMENT AND PLAN:  Principal Problem:  Chest pain Active Problems:   Elevated troponin   Pregnancy   Hypokalemia  #1. Chest pain with elevated troponin, etiology is unclear, echocardiogram is pending, cardiology consultation is pending, getting ABGs, get CT angiogram of the chest if needed to rule out pulmonary embolism, as patient has history of spontaneous abortion and is pregnant at present, although Doppler ultrasound of lower extremities is negative for DVT   #2. Dyspnea, no obvious lung disease, echocardiogram is pending, O2 sats normal at 97-100% on room air on the exception, getting ABGs to rule out low PaO2 levels #3 left extremity swelling, no DVT on ultrasound #4 hypotension, recheck blood pressure readings #5. Pregnancy was intermittent nausea, initiate patient on B6, supportive therapy, no other medications were recommended to pregnancy #6. Hypokalemia, resolved #7. Leukocytosis, resolved, no obvious infection  #8. Hyperglycemia, fasting blood glucose level is 79, no diabetes    Management plans discussed with the patient, family and they are in agreement.   DRUG ALLERGIES:  Allergies  Allergen Reactions  . Azithromycin Swelling  . Amoxicillin Rash  . Penicillins Rash    Childhood reaction  . Sulfa Antibiotics Nausea And Vomiting    Constant vomiting    CODE STATUS:     Code Status Orders        Start     Ordered   09/23/16 2227  Full code  Continuous     09/23/16 2226    Code Status History    Date Active Date Inactive Code Status Order ID Comments User Context    This patient has a current code status but no historical code status.      TOTAL TIME TAKING CARE OF THIS PATIENT:40 minutes.    Theodoro Grist M.D on 09/24/2016 at 1:51 PM  Between 7am to 6pm - Pager - [phone]  After 6pm go to [email or web address] - password [email or web address] [facility]  Office  [phone]  CC: Primary care physician; Halina Maidens, MD

## 2016-09-24 NOTE — Discharge Instructions (Signed)
Upper Respiratory Infection, Adult Most upper respiratory infections (URIs) are caused by a virus. A URI affects the nose, throat, and upper air passages. The most common type of URI is often called "the common cold." Follow these instructions at home:  Take medicines only as told by your doctor.  Gargle warm saltwater or take cough drops to comfort your throat as told by your doctor.  Use a warm mist humidifier or inhale steam from a shower to increase air moisture. This may make it easier to breathe.  Drink enough fluid to keep your pee (urine) clear or pale yellow.  Eat soups and other clear broths.  Have a healthy diet.  Rest as needed.  Go back to work when your fever is gone or your doctor says it is okay.  You may need to stay home longer to avoid giving your URI to others.  You can also wear a face mask and wash your hands often to prevent spread of the virus.  Use your inhaler more if you have asthma.  Do not use any tobacco products, including cigarettes, chewing tobacco, or electronic cigarettes. If you need help quitting, ask your doctor. Contact a doctor if:  You are getting worse, not better.  Your symptoms are not helped by medicine.  You have chills.  You are getting more short of breath.  You have brown or red mucus.  You have yellow or brown discharge from your nose.  You have pain in your face, especially when you bend forward.  You have a fever.  You have puffy (swollen) neck glands.  You have pain while swallowing.  You have white areas in the back of your throat. Get help right away if:  You have very bad or constant:  Headache.  Ear pain.  Pain in your forehead, behind your eyes, and over your cheekbones (sinus pain).  Chest pain.  You have long-lasting (chronic) lung disease and any of the following:  Wheezing.  Long-lasting cough.  Coughing up blood.  A change in your usual mucus.  You have a stiff neck.  You have  changes in your:  Vision.  Hearing.  Thinking.  Mood. This information is not intended to replace advice given to you by your health care provider. Make sure you discuss any questions you have with your health care provider. Document Released: 02/20/2008 Document Revised: 05/06/2016 Document Reviewed: 12/09/2013 Elsevier Interactive Patient Education  2017 Elsevier Inc.  

## 2016-09-24 NOTE — Telephone Encounter (Signed)
Completed.
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
## 2016-09-24 NOTE — Telephone Encounter (Signed)
Pt currently in hospital for evaluation of elevated troponin. Doing well and reports will be sent home today. Pt questions effect on pregnancy.   Advised ER follow up visit to review course of care and listen to [facility] Monitor for vaginal bleeding, abdominal pain or contractions, and leakage of fluid. Call back if any other questions or concerns.    Diona Fanti, CNM

## 2016-09-24 NOTE — Progress Notes (Signed)
Patient is admitted to room 257 with the diagnosis of chest pain. Alert and oriented x 4. C/o mid chest pressure of 4/10 on a pain scale and requested for Tylenol 650 mg oral. Tele box called to CCMD with Suzzanne Cloud as another Psychologist, counselling. Skin assessment done with Gelene Mink. RN. No skin issues of concern noted. Password was set up and fall contract reviewed and signed. Mother is at bedside voiced no concerns. Will continue to monitor.

## 2016-09-24 NOTE — Progress Notes (Signed)
Initial Nutrition Assessment  DOCUMENTATION CODES:   Not applicable  INTERVENTION:  1. Monitor and Encourage PO intake, cater patient preferences given pregnancy.  NUTRITION DIAGNOSIS:   Inadequate oral intake related to nausea, vomiting, poor appetite as evidenced by per patient/family report.  GOAL:   Patient will meet greater than or equal to 90% of their needs  MONITOR:   PO intake, I & O's, Labs, Weight trends  REASON FOR ASSESSMENT:   Malnutrition Screening Tool    ASSESSMENT:   Sarah Edwards is a 26 y.o. female has a past medical history significant for pregnancy at 12 weeks now with 1 week hx of chest pressure. Troponin mildly elevated in ER. EKG OK. No cardiac hx. Denies N/V or SOB. No cough. No LE edema. She is now admitted.  Spoke with Ms. Dixon at bedside. She reports approximately a 2 week period where she had consistent nausea and vomiting in addition to a cold, and was not consuming any food. She was consuming some fluid during this time. Reports a 5#/3.5% insignificant wt loss during this time. Indicates that this nausea/vomiting has resolved, her only triggers for nausea/vomiting now are "fragrances." Drinks ginger ale, ginger tea, sprite, and Eats 6-8 small meals throughout the day to overcome morning sickness Had a cheeseburger and mashed potatoes today for Lunch - ate 90% of her tray. Really only complains that she has a taste for "certain foods," otherwise she is doing fine.  Nutrition-Focused physical exam completed. Findings are no fat depletion, no muscle depletion, and no edema.   Labs and medications reviewed: NS w/ KCL 45mq @ 755mhr  Diet Order:  Diet regular Room service appropriate? Yes; Fluid consistency: Thin  Skin:  Reviewed, no issues  Last BM:  09/22/2016  Height:   Ht Readings from Last 1 Encounters:  09/23/16 5' 4"  (1.626 m)    Weight:   Wt Readings from Last 1 Encounters:  09/24/16 137 lb 12.8 oz (62.5 kg)    Ideal  Body Weight:  54.54 kg  BMI:  Body mass index is 23.65 kg/m.  Estimated Nutritional Needs:   Kcal:  1897 (30 cal/kg)  Protein:  60-74 gm (1-1.2 g/kg)  Fluid:  >/= 1.9L  EDUCATION NEEDS:   No education needs identified at this time  WiSatira AnisWard, MS, RD LDN Inpatient Clinical Dietitian Pager [phone]

## 2016-10-09 ENCOUNTER — Telehealth: Payer: Self-pay | Admitting: Certified Nurse Midwife

## 2016-10-09 NOTE — Telephone Encounter (Signed)
Patient states she is cramping on right side and having a light brown discharge. She stated she just had intercourse 2 days ago.Please Advise

## 2016-10-09 NOTE — Telephone Encounter (Signed)
Called pt she states that she is having some brownish discharge (notes she had intercourse 2 days ago) pt also notes some pain in the RLQ. Gave pt bleeding precautions (period like bleeding, soaking a pad) advised pt to take Tylenol for pain monitor for now call back if symptoms worsen. Pt gave verbal understanding.

## 2016-10-26 ENCOUNTER — Encounter: Payer: Self-pay | Admitting: Certified Nurse Midwife

## 2016-10-26 ENCOUNTER — Ambulatory Visit (INDEPENDENT_AMBULATORY_CARE_PROVIDER_SITE_OTHER): Payer: 59 | Admitting: Certified Nurse Midwife

## 2016-10-26 VITALS — BP 104/62 | HR 91 | Wt 144.2 lb

## 2016-10-26 DIAGNOSIS — Z3482 Encounter for supervision of other normal pregnancy, second trimester: Secondary | ICD-10-CM

## 2016-10-26 LAB — POCT URINALYSIS DIPSTICK
BILIRUBIN UA: NEGATIVE
Blood, UA: NEGATIVE
GLUCOSE UA: NEGATIVE
Ketones, UA: NEGATIVE
NITRITE UA: NEGATIVE
Protein, UA: NEGATIVE
Spec Grav, UA: 1.025
UROBILINOGEN UA: NEGATIVE
pH, UA: 6

## 2016-10-26 NOTE — Patient Instructions (Signed)
Round Ligament Pain Introduction The round ligament is a cord of muscle and tissue that helps to support the uterus. It can become a source of pain during pregnancy if it becomes stretched or twisted as the baby grows. The pain usually begins in the second trimester of pregnancy, and it can come and go until the baby is delivered. It is not a serious problem, and it does not cause harm to the baby. Round ligament pain is usually a short, sharp, and pinching pain, but it can also be a dull, lingering, and aching pain. The pain is felt in the lower side of the abdomen or in the groin. It usually starts deep in the groin and moves up to the outside of the hip area. Pain can occur with:  A sudden change in position.  Rolling over in bed.  Coughing or sneezing.  Physical activity. Follow these instructions at home: Watch your condition for any changes. Take these steps to help with your pain:  When the pain starts, relax. Then try:  Sitting down.  Flexing your knees up to your abdomen.  Lying on your side with one pillow under your abdomen and another pillow between your legs.  Sitting in a warm bath for 15-20 minutes or until the pain goes away.  Take over-the-counter and prescription medicines only as told by your health care provider.  Move slowly when you sit and stand.  Avoid long walks if they cause pain.  Stop or lessen your physical activities if they cause pain. Contact a health care provider if:  Your pain does not go away with treatment.  You feel pain in your back that you did not have before.  Your medicine is not helping. Get help right away if:  You develop a fever or chills.  You develop uterine contractions.  You develop vaginal bleeding.  You develop nausea or vomiting.  You develop diarrhea.  You have pain when you urinate. This information is not intended to replace advice given to you by your health care provider. Make sure you discuss any questions  you have with your health care provider. Document Released: 06/12/2008 Document Revised: 02/09/2016 Document Reviewed: 11/10/2014  2017 Elsevier

## 2016-10-26 NOTE — Progress Notes (Signed)
ROB- no complaints. Pain on rt side during nite. Declines flu vaccine.

## 2016-10-26 NOTE — Progress Notes (Signed)
ROB-Pt doing well. Discussed round ligament pain and home treatment measures. RTC x 4-5 weeks for ROB and anatomy scan.

## 2016-11-05 ENCOUNTER — Telehealth: Payer: Self-pay | Admitting: Internal Medicine

## 2016-11-05 NOTE — Telephone Encounter (Signed)
Called pt she states that she was told by [facility] u/s tech that the gender of her baby was female at [place] by u/s pt states that she is now second guessing this and wants to have a panorma, pt states she needs the results by Saturday. Advised pt 1. That panorama is a form of genetic testing (which she declined in 1st trimester) and that gender is secondary. 2. Advised pt that we would not be able to have the test back in 6 days. 3. Advised pt to visit outside u/s such as [facility] for 4D u/s to see gender. PT gave verbal understanding.

## 2016-11-05 NOTE — Telephone Encounter (Signed)
Pt called wanting to know if she can get the blood test for gender.  She wants to make sure of the gender before Saturday .  Call back is [phone]  Thanks Con Memos

## 2016-11-14 ENCOUNTER — Telehealth: Payer: Self-pay | Admitting: Certified Nurse Midwife

## 2016-11-14 NOTE — Telephone Encounter (Signed)
Pt states for the last 3 days she is having dizzy spells, feels like she is going to pass out when she gets up, pls advise

## 2016-11-14 NOTE — Telephone Encounter (Signed)
Pt called and she is pregnant and for the past 3 days she has been very weak and unable to keep food down,  She would like a call back on what she needs to do.

## 2016-11-15 NOTE — Telephone Encounter (Signed)
Please see if she is feeling any better today.

## 2016-11-19 ENCOUNTER — Encounter: Payer: Self-pay | Admitting: Certified Nurse Midwife

## 2016-11-19 ENCOUNTER — Telehealth: Payer: Self-pay

## 2016-11-19 NOTE — Telephone Encounter (Signed)
Call transferred from front desk- Pt states that this am she is having stabbing pain on her rt side. She has been squatting to water flowers in her office. Took Tylenol (2) this am. Does this q am d/t h/a. No n/v/d. BM normal this am. Pos fm. NO vb. Advised heating pad. Take Tylenol q 4h. Stay hydrated. If pain gets worse she is to contact the office for an appt.

## 2016-11-30 ENCOUNTER — Ambulatory Visit (INDEPENDENT_AMBULATORY_CARE_PROVIDER_SITE_OTHER): Payer: 59 | Admitting: Certified Nurse Midwife

## 2016-11-30 ENCOUNTER — Ambulatory Visit (INDEPENDENT_AMBULATORY_CARE_PROVIDER_SITE_OTHER): Payer: 59

## 2016-11-30 VITALS — BP 123/56 | HR 97 | Wt 146.9 lb

## 2016-11-30 DIAGNOSIS — Z3482 Encounter for supervision of other normal pregnancy, second trimester: Secondary | ICD-10-CM | POA: Diagnosis not present

## 2016-11-30 LAB — POCT URINALYSIS DIPSTICK
Bilirubin, UA: NEGATIVE
Blood, UA: NEGATIVE
GLUCOSE UA: NEGATIVE
KETONES UA: NEGATIVE
Nitrite, UA: NEGATIVE
Protein, UA: NEGATIVE
SPEC GRAV UA: 1.01 (ref 1.030–1.035)
Urobilinogen, UA: NEGATIVE (ref ?–2.0)
pH, UA: 6 (ref 5.0–8.0)

## 2016-11-30 NOTE — Progress Notes (Signed)
ROB-Pt doing well. Reports decreased episodes of dizziness and nausea since diet change. Discussed home treatment of headaches during pregnancy including OTC Tylenol. Reviewed red flag symptoms and when to call. RTC x 4 weeks for ROB.

## 2016-11-30 NOTE — Progress Notes (Signed)
ULTRASOUND REPORT  Location: [facility] Date of Service: 11/30/16  Indications: Anatomy Findings:  Singleton intrauterine pregnancy is visualized with FHR at 147 BPM. Biometrics give an (U/S) Gestational age of [redacted] weeks and 2 days, and an (U/S) EDD of 04/17/17; this correlates with the clinically established EDD of 04/17/17.  Fetal presentation is vertex, spine variable.  EFW: 343 g ( 0 lbs. 12 oz.). Placenta: Posterior, grade 0 and remote to cervix at 4.0 cm. AFI: Subjectively adequate with an MVP of 4.6 cm.  Anatomic survey is complete and appears WNL. Gender - Female.   Right Ovary measures 2.4 x 1.5 x 1.8 cm, and appears WNL. Left Ovary measures 3.0 x 1.6 x 1.6 cm, and appears WNL. There is no evidence of a corpus luteal cyst. Survey of the adnexa demonstrates no adnexal masses. There is no free peritoneal fluid in the cul de sac.  Impression: 1. 20 week 2 day Viable Singleton Intrauterine pregnancy by U/S. 2. (U/S) EDD is consistent with Clinically established (LMP) EDD of 04/17/17. 3. Normal appearing anatomy scan.  Recommendations: 1.Clinical correlation with the patient's History and Physical Exam.

## 2016-12-20 ENCOUNTER — Encounter: Payer: Self-pay | Admitting: Certified Nurse Midwife

## 2016-12-28 ENCOUNTER — Encounter: Payer: Self-pay | Admitting: Certified Nurse Midwife

## 2016-12-28 ENCOUNTER — Ambulatory Visit (INDEPENDENT_AMBULATORY_CARE_PROVIDER_SITE_OTHER): Payer: 59 | Admitting: Certified Nurse Midwife

## 2016-12-28 VITALS — BP 100/58 | HR 94 | Wt 155.1 lb

## 2016-12-28 DIAGNOSIS — Z130 Encounter for screening for diseases of the blood and blood-forming organs and certain disorders involving the immune mechanism: Secondary | ICD-10-CM

## 2016-12-28 DIAGNOSIS — Z131 Encounter for screening for diabetes mellitus: Secondary | ICD-10-CM

## 2016-12-28 DIAGNOSIS — Z3482 Encounter for supervision of other normal pregnancy, second trimester: Secondary | ICD-10-CM

## 2016-12-28 LAB — POCT URINALYSIS DIPSTICK
Bilirubin, UA: NEGATIVE
Glucose, UA: NEGATIVE
Ketones, UA: NEGATIVE
Leukocytes, UA: NEGATIVE
NITRITE UA: NEGATIVE
PROTEIN UA: NEGATIVE
RBC UA: NEGATIVE
UROBILINOGEN UA: NEGATIVE U/dL — AB
pH, UA: 6 (ref 5.0–8.0)

## 2016-12-28 NOTE — Progress Notes (Signed)
ROB- vaginal pains. Really bad h/a. Tylenol not helping.

## 2016-12-28 NOTE — Patient Instructions (Signed)
Orthostatic Hypotension Orthostatic hypotension is a sudden drop in blood pressure that happens when you quickly change positions, such as when you get up from a seated or lying position. Blood pressure is a measurement of how strongly, or weakly, your blood is pressing against the walls of your arteries. Arteries are blood vessels that carry blood from your heart throughout your body. When blood pressure is too low, you may not get enough blood to your brain or to the rest of your organs. This can cause weakness, light-headedness, rapid heartbeat, and fainting. This can last for just a few seconds or for up to a few minutes. Orthostatic hypotension is usually not a serious problem. However, if it happens frequently or gets worse, it may be a sign of something more serious. What are the causes? This condition may be caused by:  Sudden changes in posture, such as standing up quickly after you have been sitting or lying down.  Blood loss.  Loss of body fluids (dehydration).  Heart problems.  Hormone (endocrine) problems.  Pregnancy.  Severe infection.  Lack of certain nutrients.  Severe allergic reactions (anaphylaxis).  Certain medicines, such as blood pressure medicine or medicines that make the body lose excess fluids (diuretics). Sometimes, this condition can be caused by not taking medicine as directed, such as taking too much of a certain medicine. What increases the risk? Certain factors can make you more likely to develop orthostatic hypotension, including:  Age. Risk increases as you get older.  Conditions that affect the heart or the central nervous system.  Taking certain medicines, such as blood pressure medicine or diuretics.  Being pregnant. What are the signs or symptoms? Symptoms of this condition may include:  Weakness.  Light-headedness.  Dizziness.  Blurred vision.  Fatigue.  Rapid heartbeat.  Fainting, in severe cases. How is this diagnosed? This  condition is diagnosed based on:  Your medical history.  Your symptoms.  Your blood pressure measurement. Your health care provider will check your blood pressure when you are:  Lying down.  Sitting.  Standing. A blood pressure reading is recorded as two numbers, such as "120 over 80" (or 120/80). The first ("top") number is called the systolic pressure. It is a measure of the pressure in your arteries as your heart beats. The second ("bottom") number is called the diastolic pressure. It is a measure of the pressure in your arteries when your heart relaxes between beats. Blood pressure is measured in a unit called mm Hg. Healthy blood pressure for adults is 120/80. If your blood pressure is below 90/60, you may be diagnosed with hypotension. Other information or tests that may be used to diagnose orthostatic hypotension include:  Your other vital signs, such as your heart rate and temperature.  Blood tests.  Tilt table test. For this test, you will be safely secured to a table that moves you from a lying position to an upright position. Your heart rhythm and blood pressure will be monitored during the test. How is this treated? Treatment for this condition may include:  Changing your diet. This may involve eating more salt (sodium) or drinking more water.  Taking medicines to raise your blood pressure.  Changing the dosage of certain medicines you are taking that might be lowering your blood pressure.  Wearing compression stockings. These stockings help to prevent blood clots and reduce swelling in your legs. In some cases, you may need to go to the hospital for:  Fluid replacement. This means you will  receive fluids through an IV tube.  Blood replacement. This means you will receive donated blood through an IV tube (transfusion).  Treating an infection or heart problems, if this applies.  Monitoring. You may need to be monitored while medicines that you are taking wear  off. Follow these instructions at home: Eating and drinking    Drink enough fluid to keep your urine clear or pale yellow.  Eat a healthy diet and follow instructions from your health care provider about eating or drinking restrictions. A healthy diet includes:  Fresh fruits and vegetables.  Whole grains.  Lean meats.  Low-fat dairy products.  Eat extra salt only as directed. Do not add extra salt to your diet unless your health care provider told you to do that.  Eat frequent, small meals.  Avoid standing up suddenly after eating. Medicines   Take over-the-counter and prescription medicines only as told by your health care provider.  Follow instructions from your health care provider about changing the dosage of your current medicines, if this applies.  Do not stop or adjust any of your medicines on your own. General instructions   Wear compression stockings as told by your health care provider.  Get up slowly from lying down or sitting positions. This gives your blood pressure a chance to adjust.  Avoid hot showers and excessive heat as directed by your health care provider.  Return to your normal activities as told by your health care provider. Ask your health care provider what activities are safe for you.  Do not use any products that contain nicotine or tobacco, such as cigarettes and e-cigarettes. If you need help quitting, ask your health care provider.  Keep all follow-up visits as told by your health care provider. This is important. Contact a health care provider if:  You vomit.  You have diarrhea.  You have a fever for more than 2-3 days.  You feel more thirsty than usual.  You feel weak and tired. Get help right away if:  You have chest pain.  You have a fast or irregular heartbeat.  You develop numbness in any part of your body.  You cannot move your arms or your legs.  You have trouble speaking.  You become sweaty or feel  lightheaded.  You faint.  You feel short of breath.  You have trouble staying awake.  You feel confused. This information is not intended to replace advice given to you by your health care provider. Make sure you discuss any questions you have with your health care provider. Document Released: 08/24/2002 Document Revised: 05/22/2016 Document Reviewed: 02/24/2016 Elsevier Interactive Patient Education  2017 [employer].

## 2016-12-30 NOTE — Progress Notes (Signed)
ROB-Pt reports near syncopal episode while sitting in exam room today. After drinking a juice box and eating crackers, she reports relief of symptoms.  she reports a similar episodes that stopped her from going to work due to "fear of having an episode while driving".  Prior to each episode, [name] gets "a headache and has a hot flash". BP wnl. Denies difficulty breathing or respiratory distress and chest pain. Encouraged pt to journal activities surround episodes. Advised symptoms management techniques including abdominal support, oral hydration, frequent breaks at work, small frequent meals and snacks, slowly changing positions, and lowering herself to the ground prior to syncopal episodes. Pt verbalizes understanding. She also reports abdominal stretch marks and dry skin despite using [place] stretch cream. Advised tepid oatmeal baths, Aveeno products including the night cream, and increased water intake. Encouraged pt to submit intermittent FMLA paperwork for completion if needed. Reviewed red flag symptoms and when to call. RTC x 4 weeks for glucola, H/H, and ROB.

## 2017-01-18 ENCOUNTER — Ambulatory Visit (INDEPENDENT_AMBULATORY_CARE_PROVIDER_SITE_OTHER): Payer: 59 | Admitting: Certified Nurse Midwife

## 2017-01-18 ENCOUNTER — Other Ambulatory Visit: Payer: 59

## 2017-01-18 ENCOUNTER — Encounter: Payer: Self-pay | Admitting: Certified Nurse Midwife

## 2017-01-18 VITALS — BP 97/64 | HR 96 | Wt 158.2 lb

## 2017-01-18 DIAGNOSIS — Z23 Encounter for immunization: Secondary | ICD-10-CM

## 2017-01-18 DIAGNOSIS — Z131 Encounter for screening for diabetes mellitus: Secondary | ICD-10-CM | POA: Diagnosis not present

## 2017-01-18 DIAGNOSIS — Z130 Encounter for screening for diseases of the blood and blood-forming organs and certain disorders involving the immune mechanism: Secondary | ICD-10-CM

## 2017-01-18 DIAGNOSIS — Z3492 Encounter for supervision of normal pregnancy, unspecified, second trimester: Secondary | ICD-10-CM

## 2017-01-18 LAB — POCT URINALYSIS DIPSTICK
Bilirubin, UA: NEGATIVE
Glucose, UA: NEGATIVE
KETONES UA: NEGATIVE
Leukocytes, UA: NEGATIVE
NITRITE UA: NEGATIVE
PH UA: 8 (ref 5.0–8.0)
Protein, UA: NEGATIVE
RBC UA: NEGATIVE
SPEC GRAV UA: 1.015 (ref 1.010–1.025)
UROBILINOGEN UA: 0.2 U/dL

## 2017-01-18 NOTE — Patient Instructions (Signed)

## 2017-01-18 NOTE — Progress Notes (Signed)
ROB, doing well. Glucola testing today. Discussed GTT testing and next steps if results elevated. Questions answered on breastfeeding , vacuum assisted deliveries, vaginal exam , and GBS testing. She denies LOF and vaginal bleeding. She is having occasional braxton hicks contractions. Will follow up with GTT results. ROB in 2 wks.   Philip Aspen, CNM

## 2017-01-19 LAB — GLUCOSE, 1 HOUR GESTATIONAL: Gestational Diabetes Screen: 122 mg/dL (ref 65–139)

## 2017-01-30 ENCOUNTER — Observation Stay
Admission: EM | Admit: 2017-01-30 | Discharge: 2017-01-30 | Disposition: A | Discharge status: Home / Self Care | Payer: 59 | Attending: Obstetrics and Gynecology | Admitting: Obstetrics and Gynecology

## 2017-01-30 ENCOUNTER — Encounter: Payer: Self-pay | Admitting: Emergency Medicine

## 2017-01-30 DIAGNOSIS — Y92410 Unspecified street and highway as the place of occurrence of the external cause: Secondary | ICD-10-CM | POA: Insufficient documentation

## 2017-01-30 DIAGNOSIS — Z3A29 29 weeks gestation of pregnancy: Secondary | ICD-10-CM | POA: Insufficient documentation

## 2017-01-30 DIAGNOSIS — Z87891 Personal history of nicotine dependence: Secondary | ICD-10-CM | POA: Insufficient documentation

## 2017-01-30 DIAGNOSIS — Z0379 Encounter for other suspected maternal and fetal conditions ruled out: Secondary | ICD-10-CM | POA: Diagnosis not present

## 2017-01-30 DIAGNOSIS — Z041 Encounter for examination and observation following transport accident: Secondary | ICD-10-CM | POA: Diagnosis not present

## 2017-01-30 LAB — URINALYSIS, COMPLETE (UACMP) WITH MICROSCOPIC
BACTERIA UA: NONE SEEN
BILIRUBIN URINE: NEGATIVE
Glucose, UA: NEGATIVE mg/dL
HGB URINE DIPSTICK: NEGATIVE
Ketones, ur: NEGATIVE mg/dL
LEUKOCYTES UA: NEGATIVE
NITRITE: NEGATIVE
PH: 6 (ref 5.0–8.0)
Protein, ur: NEGATIVE mg/dL
RBC / HPF: NONE SEEN RBC/hpf (ref 0–5)
SPECIFIC GRAVITY, URINE: 1.018 (ref 1.005–1.030)

## 2017-01-30 MED ORDER — SOD CITRATE-CITRIC ACID 500-334 MG/5ML PO SOLN: 30.0000 mL | ORAL | Status: DC | PRN

## 2017-01-30 MED ORDER — ACETAMINOPHEN 325 MG PO TABS
650.0000 mg | ORAL_TABLET | ORAL | Status: DC | PRN
  Administered 2017-01-30: 650 mg via ORAL
  Filled 2017-01-30: qty 2

## 2017-01-30 NOTE — ED Notes (Signed)
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
## 2017-01-30 NOTE — ED Notes (Signed)
Patient is a patient at [facility].  Patient denies any pain or complaint.
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
## 2017-02-01 ENCOUNTER — Encounter: Payer: 59 | Admitting: Certified Nurse Midwife

## 2017-02-01 ENCOUNTER — Other Ambulatory Visit: Payer: Self-pay | Admitting: Certified Nurse Midwife

## 2017-02-01 ENCOUNTER — Ambulatory Visit (INDEPENDENT_AMBULATORY_CARE_PROVIDER_SITE_OTHER): Payer: 59 | Admitting: Obstetrics and Gynecology

## 2017-02-01 VITALS — BP 111/70 | HR 99 | Wt 160.3 lb

## 2017-02-01 DIAGNOSIS — Z130 Encounter for screening for diseases of the blood and blood-forming organs and certain disorders involving the immune mechanism: Secondary | ICD-10-CM | POA: Diagnosis not present

## 2017-02-01 DIAGNOSIS — Z3493 Encounter for supervision of normal pregnancy, unspecified, third trimester: Secondary | ICD-10-CM

## 2017-02-01 LAB — POCT URINALYSIS DIPSTICK
Bilirubin, UA: NEGATIVE
Blood, UA: NEGATIVE
GLUCOSE UA: NEGATIVE
Ketones, UA: NEGATIVE
LEUKOCYTES UA: NEGATIVE
NITRITE UA: NEGATIVE
Protein, UA: NEGATIVE
Spec Grav, UA: 1.01 (ref 1.010–1.025)
UROBILINOGEN UA: 0.2 U/dL
pH, UA: 6.5 (ref 5.0–8.0)

## 2017-02-01 NOTE — Progress Notes (Signed)
ROB-feeling better, getting married next week.
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
## 2017-02-02 LAB — HEMOGLOBIN AND HEMATOCRIT, BLOOD
Hematocrit: 27.6 % — ABNORMAL LOW (ref 34.0–46.6)
Hemoglobin: 8.7 g/dL — ABNORMAL LOW (ref 11.1–15.9)

## 2017-02-13 ENCOUNTER — Telehealth: Payer: Self-pay | Admitting: Certified Nurse Midwife

## 2017-02-13 ENCOUNTER — Encounter: Payer: Self-pay | Admitting: Certified Nurse Midwife

## 2017-02-13 DIAGNOSIS — D508 Other iron deficiency anemias: Secondary | ICD-10-CM

## 2017-02-13 NOTE — Telephone Encounter (Signed)
Patient called stating that she is confused about the (email) Sharyn Lull sent stating she needs to take a Medication (Iron supplement). Patient would like to speak nurse or doctor for further instructions on what to do next. Please advise.

## 2017-02-17 NOTE — Telephone Encounter (Signed)
Hello Deb, I have responded to this patient via MyChart about supplementation options and anemia in pregnancy. Would you please contact as well, just to make sure she does not have any additional questions.   She is anemic and needs supplementation. May have Fusion plus, iron sulfate, iron gluconate, or citranatal bloom. At her next visit, we need to check her folic acid and U59 level as well.   Thanks, JML.

## 2017-02-18 NOTE — Telephone Encounter (Signed)
Pt states that last time the Fusion Plus messed her stomach up in a big way. Is eating iron rich foods, iron with prenatal vitamins, etc. Suggested the OTC iron sulfate and to let us know Friday at her appt or sooner how this does.

## 2017-02-22 ENCOUNTER — Ambulatory Visit (INDEPENDENT_AMBULATORY_CARE_PROVIDER_SITE_OTHER): Payer: 59 | Admitting: Certified Nurse Midwife

## 2017-02-22 ENCOUNTER — Encounter: Payer: Self-pay | Admitting: Certified Nurse Midwife

## 2017-02-22 VITALS — BP 118/58 | HR 91 | Wt 165.7 lb

## 2017-02-22 DIAGNOSIS — Z3493 Encounter for supervision of normal pregnancy, unspecified, third trimester: Secondary | ICD-10-CM

## 2017-02-22 DIAGNOSIS — D649 Anemia, unspecified: Secondary | ICD-10-CM

## 2017-02-22 LAB — POCT URINALYSIS DIPSTICK
BILIRUBIN UA: NEGATIVE
Blood, UA: NEGATIVE
GLUCOSE UA: NEGATIVE
Ketones, UA: NEGATIVE
NITRITE UA: NEGATIVE
Protein, UA: NEGATIVE
Spec Grav, UA: <=1.005 — AB (ref 1.010–1.025)
UROBILINOGEN UA: 0.2 U/dL
pH, UA: 6.5 (ref 5.0–8.0)

## 2017-02-22 NOTE — Patient Instructions (Signed)
How a Baby Grows During Pregnancy Pregnancy begins when a female's sperm enters a female's egg (fertilization). This happens in one of the tubes (fallopian tubes) that connect the ovaries to the womb (uterus). The fertilized egg is called an embryo until it reaches 10 weeks. From 10 weeks until birth, it is called a fetus. The fertilized egg moves down the fallopian tube to the uterus. Then it implants into the lining of the uterus and begins to grow. The developing fetus receives oxygen and nutrients through the pregnant woman's bloodstream and the tissues that grow (placenta) to support the fetus. The placenta is the life support system for the fetus. It provides nutrition and removes waste. Learning as much as you can about your pregnancy and how your baby is developing can help you enjoy the experience. It can also make you aware of when there might be a problem and when to ask questions. How long does a typical pregnancy last? A pregnancy usually lasts 280 days, or about 40 weeks. Pregnancy is divided into three trimesters:  First trimester: 0-13 weeks.  Second trimester: 14-27 weeks.  Third trimester: 28-40 weeks.  The day when your baby is considered ready to be born (full term) is your estimated date of delivery. How does my baby develop month by month? First month  The fertilized egg attaches to the inside of the uterus.  Some cells will form the placenta. Others will form the fetus.  The arms, legs, brain, spinal cord, lungs, and heart begin to develop.  At the end of the first month, the heart begins to beat.  Second month  The bones, inner ear, eyelids, hands, and feet form.  The genitals develop.  By the end of 8 weeks, all major organs are developing.  Third month  All of the internal organs are forming.  Teeth develop below the gums.  Bones and muscles begin to grow. The spine can flex.  The skin is transparent.  Fingernails and toenails begin to form.  Arms  and legs continue to grow longer, and hands and feet develop.  The fetus is about 3 in (7.6 cm) long.  Fourth month  The placenta is completely formed.  The external sex organs, neck, outer ear, eyebrows, eyelids, and fingernails are formed.  The fetus can hear, swallow, and move its arms and legs.  The kidneys begin to produce urine.  The skin is covered with a white waxy coating (vernix) and very fine hair (lanugo).  Fifth month  The fetus moves around more and can be felt for the first time (quickening).  The fetus starts to sleep and wake up and may begin to suck its finger.  The nails grow to the end of the fingers.  The organ in the digestive system that makes bile (gallbladder) functions and helps to digest the nutrients.  If your baby is a girl, eggs are present in her ovaries. If your baby is a boy, testicles start to move down into his scrotum.  Sixth month  The lungs are formed, but the fetus is not yet able to breathe.  The eyes open. The brain continues to develop.  Your baby has fingerprints and toe prints. Your baby's hair grows thicker.  At the end of the second trimester, the fetus is about 9 in (22.9 cm) long.  Seventh month  The fetus kicks and stretches.  The eyes are developed enough to sense changes in light.  The hands can make a grasping motion.  The  fetus responds to sound.  Eighth month  All organs and body systems are fully developed and functioning.  Bones harden and taste buds develop. The fetus may hiccup.  Certain areas of the brain are still developing. The skull remains soft.  Ninth month  The fetus gains about  lb (0.23 kg) each week.  The lungs are fully developed.  Patterns of sleep develop.  The fetus's head typically moves into a head-down position (vertex) in the uterus to prepare for birth. If the buttocks move into a vertex position instead, the baby is breech.  The fetus weighs 6-9 lbs (2.72-4.08 kg) and is  19-20 in (48.26-50.8 cm) long.  What can I do to have a healthy pregnancy and help my baby develop? Eating and Drinking  Eat a healthy diet. ? Talk with your health care provider to make sure that you are getting the nutrients that you and your baby need. ? Visit www.BuildDNA.es to learn about creating a healthy diet.  Gain a healthy amount of weight during pregnancy as advised by your health care provider. This is usually 25-35 pounds. You may need to: ? Gain more if you were underweight before getting pregnant or if you are pregnant with more than one baby. ? Gain less if you were overweight or obese when you got pregnant.  Medicines and Vitamins  Take prenatal vitamins as directed by your health care provider. These include vitamins such as folic acid, iron, calcium, and vitamin D. They are important for healthy development.  Take medicines only as directed by your health care provider. Read labels and ask a pharmacist or your health care provider whether over-the-counter medicines, supplements, and prescription drugs are safe to take during pregnancy.  Activities  Be physically active as advised by your health care provider. Ask your health care provider to recommend activities that are safe for you to do, such as walking or swimming.  Do not participate in strenuous or extreme sports.  Lifestyle  Do not drink alcohol.  Do not use any tobacco products, including cigarettes, chewing tobacco, or electronic cigarettes. If you need help quitting, ask your health care provider.  Do not use illegal drugs.  Safety  Avoid exposure to mercury, lead, or other heavy metals. Ask your health care provider about common sources of these heavy metals.  Avoid listeria infection during pregnancy. Follow these precautions: ? Do not eat soft cheeses or deli meats. ? Do not eat hot dogs unless they have been warmed up to the point of steaming, such as in the microwave oven. ? Do not  drink unpasteurized milk.  Avoid toxoplasmosis infection during pregnancy. Follow these precautions: ? Do not change your cat's litter box, if you have a cat. Ask someone else to do this for you. ? Wear gardening gloves while working in the yard.  General Instructions  Keep all follow-up visits as directed by your health care provider. This is important. This includes prenatal care and screening tests.  Manage any chronic health conditions. Work closely with your health care provider to keep conditions, such as diabetes, under control.  How do I know if my baby is developing well? At each prenatal visit, your health care provider will do several different tests to check on your health and keep track of your baby's development. These include:  Fundal height. ? Your health care provider will measure your growing belly from top to bottom using a tape measure. ? Your health care provider will also feel your belly  to determine your baby's position.  Heartbeat. ? An ultrasound in the first trimester can confirm pregnancy and show a heartbeat, depending on how far along you are. ? Your health care provider will check your baby's heart rate at every prenatal visit. ? As you get closer to your delivery date, you may have regular fetal heart rate monitoring to make sure that your baby is not in distress.  Second trimester ultrasound. ? This ultrasound checks your baby's development. It also indicates your baby's gender.  What should I do if I have concerns about my baby's development? Always talk with your health care provider about any concerns that you may have. This information is not intended to replace advice given to you by your health care provider. Make sure you discuss any questions you have with your health care provider. Document Released: 02/20/2008 Document Revised: 02/09/2016 Document Reviewed: 02/10/2014 Elsevier Interactive Patient Education  [employer].

## 2017-02-22 NOTE — Progress Notes (Signed)
Rob, doing well. Denies LOF, bleeding and contractions. Endorses good fetal movement. Folate and B 12 labs today to evaluate anemia. Will follow up with result. Return in 2 wks.   Philip Aspen, CNM

## 2017-02-22 NOTE — Progress Notes (Signed)
Rob- per JML- b12 and folate today.

## 2017-02-23 LAB — FOLATE: Folate: 8.4 ng/mL

## 2017-02-23 LAB — VITAMIN B12: Vitamin B-12: 253 pg/mL (ref 232–1245)

## 2017-02-26 ENCOUNTER — Telehealth: Payer: Self-pay | Admitting: Certified Nurse Midwife

## 2017-02-26 ENCOUNTER — Encounter: Payer: Self-pay | Admitting: Certified Nurse Midwife

## 2017-02-26 ENCOUNTER — Other Ambulatory Visit: Payer: Self-pay | Admitting: Certified Nurse Midwife

## 2017-02-26 NOTE — Telephone Encounter (Signed)
Patient called wanting to know results, find out why she has not received a call back, I looked in patients Chart and informed her that we do not have the results yet, and that someone will call her back when we receive the results from her lab. Thank you.

## 2017-03-05 ENCOUNTER — Ambulatory Visit (INDEPENDENT_AMBULATORY_CARE_PROVIDER_SITE_OTHER): Payer: 59 | Admitting: Certified Nurse Midwife

## 2017-03-05 VITALS — BP 124/88 | HR 98 | Wt 167.2 lb

## 2017-03-05 DIAGNOSIS — O99013 Anemia complicating pregnancy, third trimester: Secondary | ICD-10-CM

## 2017-03-05 DIAGNOSIS — Z3493 Encounter for supervision of normal pregnancy, unspecified, third trimester: Secondary | ICD-10-CM | POA: Diagnosis not present

## 2017-03-05 LAB — POCT URINALYSIS DIPSTICK
BILIRUBIN UA: NEGATIVE
GLUCOSE UA: NEGATIVE
Ketones, UA: NEGATIVE
Leukocytes, UA: NEGATIVE
NITRITE UA: NEGATIVE
Protein, UA: NEGATIVE
RBC UA: NEGATIVE
Spec Grav, UA: 1.01 (ref 1.010–1.025)
UROBILINOGEN UA: 0.2 U/dL
pH, UA: 6.5 (ref 5.0–8.0)

## 2017-03-05 NOTE — Progress Notes (Signed)
ROB- pt is c/o swelling, otherwise she is doing well

## 2017-03-05 NOTE — Progress Notes (Signed)
ROB, doing well. Complains of swelling in her legs throughout the day. Today she has trace edema. No headaches, visual changes, or epigastric pain. Encouraged PO hydration, elevation of legs, and compression stockings. PT has been eating high iron diet x 3 wks. H&H repeated today. Discussed GBS & cultures at next visit. Follow up in 2 wks. Will follow up with results of H&H  Philip Aspen, CNM

## 2017-03-05 NOTE — Patient Instructions (Signed)
Group B Streptococcus Infection During Pregnancy Group B Streptococcus (GBS) is a type of bacteria (Streptococcus agalactiae) that is often found in healthy people, commonly in the rectum, vagina, and intestines. In people who are healthy and not pregnant, the bacteria rarely cause serious illness or complications. However, women who test positive for GBS during pregnancy can pass the bacteria to their baby during childbirth, which can cause serious infection in the baby after birth. Women with GBS may also have infections during their pregnancy or immediately after childbirth, such as such as urinary tract infections (UTIs) or infections of the uterus (uterine infections). Having GBS also increases a woman's risk of complications during pregnancy, such as early (preterm) labor or delivery, miscarriage, or stillbirth. Routine testing (screening) for GBS is recommended for all pregnant women. What increases the risk? You may have a higher risk for GBS infection during pregnancy if you had one during a past pregnancy. What are the signs or symptoms? In most cases, GBS infection does not cause symptoms in pregnant women. Signs and symptoms of a possible GBS-related infection may include:  Labor starting before the 37th week of pregnancy.  A UTI or bladder infection, which may cause: ? Fever. ? Pain or burning during urination. ? Frequent urination.  Fever during labor, along with: ? Bad-smelling discharge. ? Uterine tenderness. ? Rapid heartbeat in the mother, baby, or both.  Rare but serious symptoms of a possible GBS-related infection in women include:  Blood infection (septicemia). This may cause fever, chills, or confusion.  Lung infection (pneumonia). This may cause fever, chills, cough, rapid breathing, difficulty breathing, or chest pain.  Bone, joint, skin, or soft tissue infection.  How is this diagnosed? You may be screened for GBS between week 35 and week 37 of your pregnancy. If  you have symptoms of preterm labor, you may be screened earlier. This condition is diagnosed based on lab test results from:  A swab of fluid from the vagina and rectum.  A urine sample.  How is this treated? This condition is treated with antibiotic medicine. When you go into labor, or as soon as your water breaks (your membranes rupture), you will be given antibiotics through an IV tube. Antibiotics will continue until after you give birth. If you are having a cesarean delivery, you do not need antibiotics unless your membranes have already ruptured. Follow these instructions at home:  Take over-the-counter and prescription medicines only as told by your health care provider.  Take your antibiotic medicine as told by your health care provider. Do not stop taking the antibiotic even if you start to feel better.  Keep all pre-birth (prenatal) visits and follow-up visits as told by your health care provider. This is important. Contact a health care provider if:  You have pain or burning when you urinate.  You have to urinate frequently.  You have a fever or chills.  You develop a bad-smelling vaginal discharge. Get help right away if:  Your membranes rupture.  You go into labor.  You have severe pain in your abdomen.  You have difficulty breathing.  You have chest pain. This information is not intended to replace advice given to you by your health care provider. Make sure you discuss any questions you have with your health care provider. Document Released: 12/11/2007 Document Revised: 03/30/2016 Document Reviewed: 03/29/2016 Elsevier Interactive Patient Education  [employer].

## 2017-03-06 ENCOUNTER — Other Ambulatory Visit: Payer: Self-pay | Admitting: Certified Nurse Midwife

## 2017-03-06 ENCOUNTER — Encounter: Payer: Self-pay | Admitting: Certified Nurse Midwife

## 2017-03-06 DIAGNOSIS — O99013 Anemia complicating pregnancy, third trimester: Secondary | ICD-10-CM

## 2017-03-06 LAB — HEMOGLOBIN AND HEMATOCRIT, BLOOD
HEMOGLOBIN: 8.8 g/dL — AB (ref 11.1–15.9)
Hematocrit: 29.3 % — ABNORMAL LOW (ref 34.0–46.6)

## 2017-03-06 NOTE — Progress Notes (Signed)
Anemia in pregnancy @ 34 wks. Consult placed to hematology

## 2017-03-17 ENCOUNTER — Observation Stay
Admission: EM | Admit: 2017-03-17 | Discharge: 2017-03-18 | Disposition: A | Discharge status: Home / Self Care | Payer: 59 | Attending: Obstetrics and Gynecology | Admitting: Obstetrics and Gynecology

## 2017-03-17 DIAGNOSIS — Z3A35 35 weeks gestation of pregnancy: Secondary | ICD-10-CM | POA: Insufficient documentation

## 2017-03-17 DIAGNOSIS — B338 Other specified viral diseases: Secondary | ICD-10-CM | POA: Diagnosis not present

## 2017-03-17 DIAGNOSIS — O9989 Other specified diseases and conditions complicating pregnancy, childbirth and the puerperium: Secondary | ICD-10-CM | POA: Diagnosis present

## 2017-03-17 DIAGNOSIS — O98513 Other viral diseases complicating pregnancy, third trimester: Principal | ICD-10-CM | POA: Insufficient documentation

## 2017-03-17 MED ORDER — ACETAMINOPHEN 325 MG PO TABS: 650.0000 mg | ORAL_TABLET | ORAL | Status: DC | PRN

## 2017-03-17 MED ORDER — ONDANSETRON 4 MG PO TBDP
8.0000 mg | ORAL_TABLET | Freq: Three times a day (TID) | ORAL | Status: DC | PRN
  Administered 2017-03-17 – 2017-03-18 (×2): 8 mg via ORAL
  Filled 2017-03-17 (×2): qty 2

## 2017-03-17 MED ORDER — CALCIUM CARBONATE ANTACID 500 MG PO CHEW: 2.0000 | CHEWABLE_TABLET | ORAL | Status: DC | PRN

## 2017-03-17 MED ORDER — LACTATED RINGERS IV SOLN
INTRAVENOUS | Status: DC
  Administered 2017-03-18 (×2): via INTRAVENOUS

## 2017-03-17 NOTE — OB Triage Note (Signed)
Patient arrived in triage with c/o low back pain since yesterday. Also c/o n/v since approx 1700 today. States leaking of fluid noted with vomiting. Denies vaginal bleeding. Reports fetal movement, palpated with placement of EFM.

## 2017-03-18 DIAGNOSIS — B338 Other specified viral diseases: Secondary | ICD-10-CM | POA: Diagnosis not present

## 2017-03-18 DIAGNOSIS — O98513 Other viral diseases complicating pregnancy, third trimester: Secondary | ICD-10-CM

## 2017-03-18 DIAGNOSIS — Z3A35 35 weeks gestation of pregnancy: Secondary | ICD-10-CM | POA: Diagnosis not present

## 2017-03-18 LAB — URINALYSIS, ROUTINE W REFLEX MICROSCOPIC
Bilirubin Urine: NEGATIVE
Bilirubin Urine: NEGATIVE
Glucose, UA: 150 mg/dL — AB
Glucose, UA: NEGATIVE mg/dL
Hgb urine dipstick: NEGATIVE
Hgb urine dipstick: NEGATIVE
Ketones, ur: 5 mg/dL — AB
Ketones, ur: 5 mg/dL — AB
Leukocytes, UA: NEGATIVE
Leukocytes, UA: NEGATIVE
Nitrite: NEGATIVE
Nitrite: NEGATIVE
Protein, ur: 30 mg/dL — AB
Protein, ur: NEGATIVE mg/dL
Specific Gravity, Urine: 1.021 (ref 1.005–1.030)
Specific Gravity, Urine: 1.025 (ref 1.005–1.030)
pH: 5 (ref 5.0–8.0)
pH: 5 (ref 5.0–8.0)

## 2017-03-18 MED ORDER — LOPERAMIDE HCL 2 MG PO CAPS: 4.0000 mg | ORAL_CAPSULE | ORAL | Status: DC | PRN

## 2017-03-18 MED ORDER — PROMETHAZINE HCL 12.5 MG RE SUPP: 12.5000 mg | Freq: Four times a day (QID) | RECTAL | 0 refills | Status: DC | PRN

## 2017-03-18 MED ORDER — DEXTROSE 5 % IN LACTATED RINGERS IV BOLUS
500.0000 mL | Freq: Once | INTRAVENOUS | Status: AC
  Administered 2017-03-18: 500 mL via INTRAVENOUS

## 2017-03-18 MED ORDER — SODIUM CHLORIDE FLUSH 0.9 % IV SOLN
INTRAVENOUS | Status: AC
  Filled 2017-03-18: qty 10

## 2017-03-18 MED ORDER — ONDANSETRON 8 MG PO TBDP: 8.0000 mg | ORAL_TABLET | Freq: Three times a day (TID) | ORAL | 0 refills | Status: DC | PRN

## 2017-03-18 MED ORDER — SODIUM CHLORIDE 0.9 % IJ SOLN
INTRAMUSCULAR | Status: AC
  Filled 2017-03-18: qty 10

## 2017-03-18 MED ORDER — LOPERAMIDE HCL 2 MG PO CAPS: 4.0000 mg | ORAL_CAPSULE | ORAL | 0 refills | Status: DC | PRN

## 2017-03-18 MED ORDER — PROMETHAZINE HCL 25 MG/ML IJ SOLN
25.0000 mg | Freq: Four times a day (QID) | INTRAMUSCULAR | Status: DC | PRN
  Administered 2017-03-18 (×2): 25 mg via INTRAVENOUS
  Filled 2017-03-18: qty 1

## 2017-03-18 MED ORDER — PROMETHAZINE HCL 25 MG/ML IJ SOLN
INTRAMUSCULAR | Status: AC
  Administered 2017-03-18: 25 mg via INTRAVENOUS
  Filled 2017-03-18: qty 1

## 2017-03-18 NOTE — Progress Notes (Signed)
   L&D OB Triage Note  SUBJECTIVE Feeling better this morning. After receiving IV fluids and antiemetics. She is comfortable with going home.     Obstetric History   G3   P1   T1   P0   A1   L1    SAB1   TAB0   Ectopic0   Multiple0   Live Births1     # Outcome Date GA Lbr Len/2nd Weight Sex Delivery Anes PTL Lv  3 Current           2 Term 09/17/09   6 lb 12 oz (3.062 kg) F Vag-Spont   LIV  1 SAB        N       Prescriptions Prior to Admission  Medication Sig Dispense Refill Last Dose  . Prenatal Vit-Fe Fumarate-FA (PRENATAL MULTIVITAMIN) TABS tablet Take 1 tablet by mouth daily at 12 noon.   Past Week at Unknown time     OBJECTIVE  Nursing Evaluation:   BP (!) 106/51 (BP Location: Left Arm)   Pulse 96   Temp 99.8 F (37.7 C) (Oral)   Resp 20   Ht 5' 5"  (1.651 m)   Wt 167 lb (75.8 kg)   LMP 07/11/2016 (Approximate)   BMI 27.79 kg/m      NST was performed and has been reviewed by me.  NST INTERPRETATION: Category I  Mode: External Baseline Rate (A): 155 bpm Variability: Moderate Accelerations: 15 x 15 Decelerations: None     Contraction Frequency (min): irritability  ASSESSMENT Impression:  1. Pregnancy:  G3P1011 at [redacted]w[redacted]d, EDD Estimated Date of Delivery: 04/17/17 2.  Reactive 3. Gastrointestinal Virous   PLAN 1. Reassurance given, discussed G. I. Virous in pregnancy.  2. Discharge home with precautions to return to L&D or call the office if:  increased leakage or fluid, contractions more than  6 per  1 hour, decreased fetal movement, persistent low back pain or cramping, bleeding from vaginal area, difficulty urinating, pain with urination or fever, worsening Nausea, Vomitin, Diarrhea   3. Continue routine prenatal care as scheduled on Friday or sooner if needed 4. Prescription for Zofran and phenergan suppository PRN.   I attest that I was present and evaluated [facility]guillain the hospital .   APhilip Aspen CNM

## 2017-03-18 NOTE — Progress Notes (Signed)
   L&D OB Triage Note  SUBJECTIVE She continues to complain of nausea and is having some vomiting and diarrhea .   Obstetric History   G3   P1   T1   P0   A1   L1    SAB1   TAB0   Ectopic0   Multiple0   Live Births1     # Outcome Date GA Lbr Len/2nd Weight Sex Delivery Anes PTL Lv  3 Current           2 Term 09/17/09   6 lb 12 oz (3.062 kg) F Vag-Spont   LIV  1 SAB        N       Prescriptions Prior to Admission  Medication Sig Dispense Refill Last Dose  . Prenatal Vit-Fe Fumarate-FA (PRENATAL MULTIVITAMIN) TABS tablet Take 1 tablet by mouth daily at 12 noon.   Past Week at Unknown time     OBJECTIVE  Nursing Evaluation:   BP (!) 106/51 (BP Location: Left Arm)   Pulse 96   Temp 99.8 F (37.7 C) (Oral)   Resp 20   Ht 5' 5"  (1.651 m)   Wt 167 lb (75.8 kg)   LMP 07/11/2016 (Approximate)   BMI 27.79 kg/m       NST INTERPRETATION: Category I FHT 160, moderate variability, Accelerations present, Decelerations absent.    Contraction Frequency (min): irritability  ASSESSMENT 1. Gastrointestinal virus PLAN 1. Reassurance given, continue IV hydration and antiemetics as needed for nausea & vomiting. Plan of Discharge later this morning when pt is able to tolerate some PO fluids.

## 2017-03-18 NOTE — Progress Notes (Signed)
   L&D OB Triage Note  SUBJECTIVE Sarah Edwards is a 26 y.o. G72P1011 female at [redacted]w[redacted]d EDD Estimated Date of Delivery: 04/17/17 who presented to triage with complaints of back pain, nausea and vomiting. She denies fever, chills, urinary frequency, urgency , and burning. She endorses good fetal movement.   Obstetric History   G3   P1   T1   P0   A1   L1    SAB1   TAB0   Ectopic0   Multiple0   Live Births1     # Outcome Date GA Lbr Len/2nd Weight Sex Delivery Anes PTL Lv  3 Current           2 Term 09/17/09   6 lb 12 oz (3.062 kg) F Vag-Spont   LIV  1 SAB        N       Prescriptions Prior to Admission  Medication Sig Dispense Refill Last Dose  . Prenatal Vit-Fe Fumarate-FA (PRENATAL MULTIVITAMIN) TABS tablet Take 1 tablet by mouth daily at 12 noon.   Past Week at Unknown time     OBJECTIVE  Nursing Evaluation:   BP (!) 106/51 (BP Location: Left Arm)   Pulse 96   Temp 99.8 F (37.7 C) (Oral)   Resp 20   Ht 5' 5"  (1.651 m)   Wt 167 lb (75.8 kg)   LMP 07/11/2016 (Approximate)   BMI 27.79 kg/m    Findings:    NST was performed and has been reviewed by me.  NST INTERPRETATION: Category I  FHT 130,s Accelerations present, decelerations absent, Moderate variability.  Contraction Frequency (min): irritability  ASSESSMENT Impression:  1. Pregnancy:  G3P1011 at 316w5d EDD Estimated Date of Delivery: 04/17/17 2.  Reactive NST 3.intact membranes 4. gastrointestional virus  PLAN 1. Reassurance given 2.  Urinalysis 3. IV hydration 4. Antiemetics : zofran & phenergan prn

## 2017-03-18 NOTE — Final Progress Note (Signed)
SUBJECTIVE Feeling better this morning. After receiving IV fluids and antiemetics. She is comfortable with going home.                 Obstetric History   G3   P1   T1   P0   A1   L1    SAB1   TAB0   Ectopic0   Multiple0   Live Births1     # Outcome Date GA Lbr Len/2nd Weight Sex Delivery Anes PTL Lv  3 Current           2 Term 09/17/09   6 lb 12 oz (3.062 kg) F Vag-Spont   LIV  1 SAB        N              Prescriptions Prior to Admission  Medication Sig Dispense Refill Last Dose  . Prenatal Vit-Fe Fumarate-FA (PRENATAL MULTIVITAMIN) TABS tablet Take 1 tablet by mouth daily at 12 noon.   Past Week at Unknown time     OBJECTIVE             Nursing Evaluation:                         BP (!) 106/51 (BP Location: Left Arm)   Pulse 96   Temp 99.8 F (37.7 C) (Oral)   Resp 20   Ht 5' 5"  (1.651 m)   Wt 167 lb (75.8 kg)   LMP 07/11/2016 (Approximate)   BMI 27.79 kg/m                            NST was performed and has been reviewed by me.  NST INTERPRETATION: Category I  Mode: External Baseline Rate (A): 155 bpm Variability: Moderate Accelerations: 15 x 15 Decelerations: None Contraction Frequency (min): irritability  ASSESSMENT Impression:  1. Pregnancy:  G3P1011 at [redacted]w[redacted]d, EDD Estimated Date of Delivery: 04/17/17 2.  Reactive 3. Gastrointestinal Virous   PLAN 1. Reassurance given, discussed G. I. Virous in pregnancy.  2. Discharge home with precautions to return to L&D or call the office if:  increased leakage or fluid, contractions more than  6 per  1 hour, decreased fetal movement, persistent low back pain or cramping, bleeding from vaginal area, difficulty urinating, pain with urination or fever, worsening Nausea, Vomitin, Diarrhea   3. Continue routine prenatal care as scheduled on Friday or sooner if needed 4. Prescription for Zofran and phenergan suppository PRN.   I attest that I was present and evaluated [facility]  [facility] .   APhilip Aspen CNM

## 2017-03-21 ENCOUNTER — Telehealth: Payer: Self-pay | Admitting: *Deleted

## 2017-03-21 ENCOUNTER — Inpatient Hospital Stay: Payer: 59 | Admitting: *Deleted

## 2017-03-21 ENCOUNTER — Inpatient Hospital Stay: Payer: 59 | Attending: Oncology | Admitting: Oncology

## 2017-03-21 ENCOUNTER — Other Ambulatory Visit: Payer: Self-pay | Admitting: *Deleted

## 2017-03-21 ENCOUNTER — Inpatient Hospital Stay: Payer: 59

## 2017-03-21 ENCOUNTER — Encounter: Payer: Self-pay | Admitting: Oncology

## 2017-03-21 ENCOUNTER — Other Ambulatory Visit: Payer: Self-pay | Admitting: Oncology

## 2017-03-21 ENCOUNTER — Ambulatory Visit: Payer: 59

## 2017-03-21 VITALS — BP 104/68 | HR 92 | Temp 97.1°F | Resp 18 | Wt 170.5 lb

## 2017-03-21 DIAGNOSIS — K219 Gastro-esophageal reflux disease without esophagitis: Secondary | ICD-10-CM | POA: Diagnosis not present

## 2017-03-21 DIAGNOSIS — Z3A36 36 weeks gestation of pregnancy: Secondary | ICD-10-CM | POA: Diagnosis not present

## 2017-03-21 DIAGNOSIS — O99013 Anemia complicating pregnancy, third trimester: Secondary | ICD-10-CM | POA: Diagnosis not present

## 2017-03-21 DIAGNOSIS — Z87891 Personal history of nicotine dependence: Secondary | ICD-10-CM | POA: Insufficient documentation

## 2017-03-21 DIAGNOSIS — K5090 Crohn's disease, unspecified, without complications: Secondary | ICD-10-CM | POA: Insufficient documentation

## 2017-03-21 DIAGNOSIS — Z880 Allergy status to penicillin: Secondary | ICD-10-CM | POA: Diagnosis not present

## 2017-03-21 DIAGNOSIS — F419 Anxiety disorder, unspecified: Secondary | ICD-10-CM | POA: Diagnosis not present

## 2017-03-21 DIAGNOSIS — R5383 Other fatigue: Secondary | ICD-10-CM | POA: Insufficient documentation

## 2017-03-21 DIAGNOSIS — Z8041 Family history of malignant neoplasm of ovary: Secondary | ICD-10-CM | POA: Diagnosis not present

## 2017-03-21 DIAGNOSIS — D649 Anemia, unspecified: Secondary | ICD-10-CM | POA: Diagnosis not present

## 2017-03-21 DIAGNOSIS — N809 Endometriosis, unspecified: Secondary | ICD-10-CM | POA: Insufficient documentation

## 2017-03-21 DIAGNOSIS — Z79899 Other long term (current) drug therapy: Secondary | ICD-10-CM | POA: Diagnosis not present

## 2017-03-21 DIAGNOSIS — D519 Vitamin B12 deficiency anemia, unspecified: Secondary | ICD-10-CM | POA: Insufficient documentation

## 2017-03-21 DIAGNOSIS — Z803 Family history of malignant neoplasm of breast: Secondary | ICD-10-CM | POA: Diagnosis not present

## 2017-03-21 DIAGNOSIS — K589 Irritable bowel syndrome without diarrhea: Secondary | ICD-10-CM | POA: Insufficient documentation

## 2017-03-21 DIAGNOSIS — R5381 Other malaise: Secondary | ICD-10-CM | POA: Insufficient documentation

## 2017-03-21 DIAGNOSIS — Z87440 Personal history of urinary (tract) infections: Secondary | ICD-10-CM | POA: Insufficient documentation

## 2017-03-21 HISTORY — DX: Anemia complicating pregnancy, third trimester: O99.013

## 2017-03-21 LAB — COMPREHENSIVE METABOLIC PANEL
ALT: 21 U/L (ref 14–54)
AST: 31 U/L (ref 15–41)
Albumin: 2.8 g/dL — ABNORMAL LOW (ref 3.5–5.0)
Alkaline Phosphatase: 112 U/L (ref 38–126)
Anion gap: 7 (ref 5–15)
BUN: 8 mg/dL (ref 6–20)
CHLORIDE: 106 mmol/L (ref 101–111)
CO2: 24 mmol/L (ref 22–32)
CREATININE: 0.54 mg/dL (ref 0.44–1.00)
Calcium: 8.5 mg/dL — ABNORMAL LOW (ref 8.9–10.3)
GFR calc Af Amer: >60 mL/min (ref >60)
GFR calc non Af Amer: >60 mL/min (ref >60)
Glucose, Bld: 101 mg/dL — ABNORMAL HIGH (ref 65–99)
POTASSIUM: 2.9 mmol/L — AB (ref 3.5–5.1)
SODIUM: 137 mmol/L (ref 135–145)
Total Bilirubin: 0.3 mg/dL (ref 0.3–1.2)
Total Protein: 6.8 g/dL (ref 6.5–8.1)

## 2017-03-21 LAB — CBC WITH DIFFERENTIAL/PLATELET
BASOS PCT: 0 %
Basophils Absolute: 0 10*3/uL (ref 0–0.1)
EOS PCT: 0 %
Eosinophils Absolute: 0 10*3/uL (ref 0–0.7)
HEMATOCRIT: 26.8 % — AB (ref 35.0–47.0)
Hemoglobin: 8.7 g/dL — ABNORMAL LOW (ref 12.0–16.0)
Lymphocytes Relative: 16 %
Lymphs Abs: 1 10*3/uL (ref 1.0–3.6)
MCH: 22.9 pg — ABNORMAL LOW (ref 26.0–34.0)
MCHC: 32.4 g/dL (ref 32.0–36.0)
MCV: 70.5 fL — ABNORMAL LOW (ref 80.0–100.0)
MONO ABS: 0.3 10*3/uL (ref 0.2–0.9)
MONOS PCT: 5 %
NEUTROS ABS: 5 10*3/uL (ref 1.4–6.5)
Neutrophils Relative %: 79 %
PLATELETS: 181 10*3/uL (ref 150–440)
RBC: 3.8 MIL/uL (ref 3.80–5.20)
RDW: 16.1 % — AB (ref 11.5–14.5)
WBC: 6.4 10*3/uL (ref 3.6–11.0)

## 2017-03-21 LAB — TSH: TSH: 2.549 u[IU]/mL (ref 0.350–4.500)

## 2017-03-21 LAB — RETICULOCYTES
RBC.: 3.95 MIL/uL (ref 3.80–5.20)
Retic Count, Absolute: 67.2 10*3/uL (ref 19.0–183.0)
Retic Ct Pct: 1.7 % (ref 0.4–3.1)

## 2017-03-21 LAB — FERRITIN: FERRITIN: 14 ng/mL (ref 11–307)

## 2017-03-21 LAB — IRON AND TIBC
IRON: 20 ug/dL — AB (ref 28–170)
SATURATION RATIOS: 3 % — AB (ref 10.4–31.8)
TIBC: 595 ug/dL — AB (ref 250–450)
UIBC: 575 ug/dL

## 2017-03-21 LAB — SEDIMENTATION RATE: SED RATE: 70 mm/h — AB (ref 0–20)

## 2017-03-21 MED ORDER — CYANOCOBALAMIN 1000 MCG/ML IJ SOLN
1000.0000 ug | Freq: Once | INTRAMUSCULAR | Status: AC
  Administered 2017-03-21: 1000 ug via INTRAMUSCULAR

## 2017-03-21 MED ORDER — POTASSIUM CHLORIDE CRYS ER 20 MEQ PO TBCR: 40.0000 meq | EXTENDED_RELEASE_TABLET | Freq: Every day | ORAL | 0 refills | Status: DC

## 2017-03-21 NOTE — Telephone Encounter (Signed)
Called patient and informed her that her potassium was low and that k supplement was called into pharmacy and how to take medication, voiced understanding.

## 2017-03-21 NOTE — Telephone Encounter (Signed)
Called patient and informed her that her iron is low and that she would go ahead and get her iron infusion tomorrow, voiced understanding.

## 2017-03-21 NOTE — Progress Notes (Signed)
Patient here today as new evaluation regarding anemia in 3rd trimester.  Referred by Dr. Grandville Silos.

## 2017-03-21 NOTE — Progress Notes (Signed)
She needs venofer which is already scheduled for tomorrow. Please let her know

## 2017-03-21 NOTE — Progress Notes (Signed)
Hematology/Oncology Consult note [facility] Telephone:([phone] [phone]  Patient Care Team: Glean Hess, MD as PCP - General (Family Medicine)   Name of the patient: Sarah Edwards  [id number]  1990-12-08    Reason for referral- anemia in 3rd trimester pregnancy   Referring physician- Dr. Grandville Silos  Date of visit: 03/21/17   History of presenting illness- patient is a 26 year old female who is currently [redacted] weeks pregnant and has been referred to [place] for anemia. CBC from 09/24/2016 showed white count of 7.1, H&H of 10.2/30.6 with an MCV of 80.6 and a platelet count of 196. Her H&H over the last 6 months has been between 10-11. On 03/05/2017 her H&H was 8.8/29.3. B12 levels were low normal at 253 and folate level was normal. This is her second pregnancy and her first pregnancy was about 5 years ago. She did not have any complications during that pregnancy. Within normal vaginal delivery although she says that she was anemic at that time which did get better on its own. Also reports that she had anemia outside of her pregnancy is well. She has been trying to eat iron rich foods as well as take oral iron and liquid iron but she has not been able to tolerate iron supplements due to nausea. Denies any blood loss in her urine or stool. No family history of anemia.  ECOG PS- 0  Pain scale- 0   Review of systems- Review of Systems  Constitutional: Positive for malaise/fatigue. Negative for chills, fever and weight loss.  HENT: Negative for congestion, ear discharge and nosebleeds.   Eyes: Negative for blurred vision.  Respiratory: Negative for cough, hemoptysis, sputum production, shortness of breath and wheezing.   Cardiovascular: Positive for leg swelling. Negative for chest pain, palpitations, orthopnea and claudication.  Gastrointestinal: Negative for abdominal pain, blood in stool, constipation, diarrhea, heartburn, melena, nausea and vomiting.    Genitourinary: Negative for dysuria, flank pain, frequency, hematuria and urgency.  Musculoskeletal: Negative for back pain, joint pain and myalgias.  Skin: Negative for rash.  Neurological: Negative for dizziness, tingling, focal weakness, seizures, weakness and headaches.  Endo/Heme/Allergies: Does not bruise/bleed easily.  Psychiatric/Behavioral: Negative for depression and suicidal ideas. The patient does not have insomnia.     Allergies  Allergen Reactions  . Azithromycin Swelling  . Amoxicillin Rash  . Penicillins Rash    Childhood reaction  . Sulfa Antibiotics Nausea And Vomiting    Constant vomiting    Patient Active Problem List   Diagnosis Date Noted  . Labor and delivery, indication for care 01/30/2017  . Hypotension 09/24/2016  . Pregnancy 09/23/2016  . Anemia 09/11/2016  . Acid reflux 01/12/2016  . Episodic mood disorder ([place]) 01/12/2016  . Crohn disease ([place]) 01/07/2015  . Endometriosis 01/07/2015  . Tobacco user 01/07/2015  . Anxiety 01/07/2015     Past Medical History:  Diagnosis Date  . Abnormal uterine bleeding (AUB)   . Anxiety   . Chronic UTI   . Crohn disease ([place])   . Endometriosis   . IBS (irritable bowel syndrome)   . Painful menstrual periods   . SAB (spontaneous abortion) 01/06/2014     Past Surgical History:  Procedure Laterality Date  . COLONOSCOPY N/A 2010 and 2011   crohns  . ESOPHAGOGASTRODUODENOSCOPY ENDOSCOPY N/A 2010  . laproscopy  2012    Social History   Social History  . Marital status: Single    Spouse name: N/A  . Number of children: N/A  .  Years of education: N/A   Occupational History  .      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 Smoking status: Former Smoker    Packs/day: 0.50    Types: Cigarettes    Quit date: 03/18/2015  . Smokeless tobacco: Never Used  . Alcohol use No     Comment: occas - prior to pregnancy.  . Drug use: No  . Sexual activity: Yes    Birth control/  protection: None   Other Topics Concern  . Not on file   Social History Narrative   Lives in [place] with husband and dtr.  Does not routinely exercise.     Family History  Problem Relation Age of Onset  . Cancer Paternal Grandmother        breast   . Ovarian cancer Paternal Grandmother   . Cervical cancer Paternal Grandmother   . Lung cancer Paternal Grandfather   . Leukemia Maternal Grandfather   . Diabetes Mother   . Diabetes Father      Current Outpatient Prescriptions:  .  loperamide (IMODIUM) 2 MG capsule, Take 2 capsules (4 mg total) by mouth as needed for diarrhea or loose stools., Disp: 30 capsule, Rfl: 0 .  ondansetron (ZOFRAN-ODT) 8 MG disintegrating tablet, Take 1 tablet (8 mg total) by mouth every 8 (eight) hours as needed for nausea or vomiting., Disp: 20 tablet, Rfl: 0 .  Prenatal Vit-Fe Fumarate-FA (PRENATAL MULTIVITAMIN) TABS tablet, Take 1 tablet by mouth daily at 12 noon., Disp: , Rfl:  .  promethazine (PHENERGAN) 12.5 MG suppository, Place 1 suppository (12.5 mg total) rectally every 6 (six) hours as needed for nausea or vomiting., Disp: 12 each, Rfl: 0   Physical exam:  Vitals:   03/21/17 1033  BP: 104/68  Pulse: 92  Resp: 18  Temp: (!) 97.1 F (36.2 C)  TempSrc: Tympanic  Weight: 170 lb 8 oz (77.3 kg)   Physical Exam  Constitutional: She is oriented to person, place, and time and well-developed, well-nourished, and in no distress.  HENT:  Head: Normocephalic and atraumatic.  Eyes: EOM are normal. Pupils are equal, round, and reactive to light.  Neck: Normal range of motion.  Cardiovascular: Normal rate, regular rhythm and normal heart sounds.   Pulmonary/Chest: Effort normal and breath sounds normal.  Abdominal: Soft.  Gravid uterus  Musculoskeletal: She exhibits edema.  Neurological: She is alert and oriented to person, place, and time.  Skin: Skin is warm and dry.       CMP Latest Ref Rng & Units 09/24/2016  Glucose 65 - 99 mg/dL 79    BUN 6 - 20 mg/dL 10  Creatinine 0.44 - 1.00 mg/dL 0.42(L)  Sodium 135 - 145 mmol/L 135  Potassium 3.5 - 5.1 mmol/L 3.6  Chloride 101 - 111 mmol/L 108  CO2 22 - 32 mmol/L 22  Calcium 8.9 - 10.3 mg/dL 8.3(L)  Total Protein 6.5 - 8.1 g/dL 6.5  Total Bilirubin 0.3 - 1.2 mg/dL 0.3  Alkaline Phos 38 - 126 U/L 43  AST 15 - 41 U/L 18  ALT 14 - 54 U/L 15   CBC Latest Ref Rng & Units 03/05/2017  WBC 3.6 - 11.0 K/uL -  Hemoglobin 11.1 - 15.9 g/dL 8.8(L)  Hematocrit 34.0 - 46.6 % 29.3(L)  Platelets 150 - 440 K/uL -     Assessment and plan- Patient is a 26 y.o. female referred to [place] for normocytic anemia and third trimester of pregnancy  Today I will do a complete anemia workup including CBC with differential, CMP, ferritin and iron studies, ESR, TSH, reticulocyte count, methyl malonic acid level, haptoglobin, myeloma panel. A recent B12 and folate showed low normal B12 and therefore I will start her on parenteral B12 100 g per week 4 weeks. I will see her back in 1 week's time to discuss her blood work and further management.  No recent iron studies available to confirm that she is indeed iron deficient. If she is found to have iron deficiency based on today's labs I will plan to give her 5 doses of venofer for 200 mg over 3 weeks. I did discuss risks and benefits of benefit for including all but not limited to fatigue, leg swelling, risk of infusion reactions and headaches. Patient understands and increased proceed as planned  Thank you for this kind referral and the opportunity to participate in the care of this patient   Visit Diagnosis 1. Normocytic anemia   2. Anemia during pregnancy in third trimester     Dr. Randa Evens, MD, MPH [facility] Pager- [phone] 03/21/2017

## 2017-03-21 NOTE — Progress Notes (Signed)
Please let her know that her K is low. PO K 40 meq daily for 1 week. Check bmp in 1 week when she sees me

## 2017-03-22 ENCOUNTER — Ambulatory Visit: Payer: 59 | Admitting: Oncology

## 2017-03-22 ENCOUNTER — Inpatient Hospital Stay: Payer: 59

## 2017-03-22 ENCOUNTER — Ambulatory Visit (INDEPENDENT_AMBULATORY_CARE_PROVIDER_SITE_OTHER): Payer: 59 | Admitting: Obstetrics and Gynecology

## 2017-03-22 VITALS — BP 126/72 | HR 89 | Wt 171.4 lb

## 2017-03-22 VITALS — BP 112/71 | HR 90 | Resp 20

## 2017-03-22 DIAGNOSIS — D649 Anemia, unspecified: Secondary | ICD-10-CM | POA: Diagnosis not present

## 2017-03-22 DIAGNOSIS — Z3493 Encounter for supervision of normal pregnancy, unspecified, third trimester: Secondary | ICD-10-CM

## 2017-03-22 DIAGNOSIS — Z3685 Encounter for antenatal screening for Streptococcus B: Secondary | ICD-10-CM

## 2017-03-22 DIAGNOSIS — K589 Irritable bowel syndrome without diarrhea: Secondary | ICD-10-CM | POA: Diagnosis not present

## 2017-03-22 DIAGNOSIS — N809 Endometriosis, unspecified: Secondary | ICD-10-CM | POA: Diagnosis not present

## 2017-03-22 DIAGNOSIS — R5383 Other fatigue: Secondary | ICD-10-CM | POA: Diagnosis not present

## 2017-03-22 DIAGNOSIS — Z113 Encounter for screening for infections with a predominantly sexual mode of transmission: Secondary | ICD-10-CM

## 2017-03-22 DIAGNOSIS — D519 Vitamin B12 deficiency anemia, unspecified: Secondary | ICD-10-CM | POA: Diagnosis not present

## 2017-03-22 DIAGNOSIS — K5090 Crohn's disease, unspecified, without complications: Secondary | ICD-10-CM | POA: Diagnosis not present

## 2017-03-22 DIAGNOSIS — O99013 Anemia complicating pregnancy, third trimester: Secondary | ICD-10-CM

## 2017-03-22 DIAGNOSIS — R5381 Other malaise: Secondary | ICD-10-CM | POA: Diagnosis not present

## 2017-03-22 DIAGNOSIS — Z3A36 36 weeks gestation of pregnancy: Secondary | ICD-10-CM | POA: Diagnosis not present

## 2017-03-22 LAB — POCT URINALYSIS DIPSTICK
Bilirubin, UA: NEGATIVE
Blood, UA: NEGATIVE
GLUCOSE UA: NEGATIVE
KETONES UA: NEGATIVE
LEUKOCYTES UA: NEGATIVE
Nitrite, UA: NEGATIVE
PROTEIN UA: NEGATIVE
SPEC GRAV UA: 1.01 (ref 1.010–1.025)
Urobilinogen, UA: 0.2 E.U./dL
pH, UA: 6 (ref 5.0–8.0)

## 2017-03-22 LAB — HAPTOGLOBIN: Haptoglobin: 135 mg/dL (ref 34–200)

## 2017-03-22 MED ORDER — SODIUM CHLORIDE 0.9 % IV SOLN
Freq: Once | INTRAVENOUS | Status: AC
Start: 2017-03-22 — End: 2017-03-22
  Administered 2017-03-22: 15:00:00 via INTRAVENOUS
  Filled 2017-03-22: qty 1000

## 2017-03-22 MED ORDER — IRON SUCROSE 20 MG/ML IV SOLN
200.0000 mg | Freq: Once | INTRAVENOUS | Status: AC
  Administered 2017-03-22: 200 mg via INTRAVENOUS
  Filled 2017-03-22: qty 10

## 2017-03-22 MED ORDER — SODIUM CHLORIDE 0.9 % IV SOLN: 200.0000 mg | Freq: Once | INTRAVENOUS | Status: DC

## 2017-03-22 NOTE — Progress Notes (Signed)
ROB- cultures obtained, pt is having some pelvic pressure, contractions

## 2017-03-22 NOTE — Progress Notes (Signed)
ROB- cultures obtained, labor precautions discussed. Recommended compression socks to help with pedal edema, got iron infusion today- needs note to excuse from work after infusions. Plans FOB & mother-in-law for labor support, discussed birth control- wants pills only . Had epidural and multiple complications, 6 hours of labor. OK with IV meds.

## 2017-03-23 LAB — METHYLMALONIC ACID, SERUM: Methylmalonic Acid, Quantitative: 183 nmol/L (ref 0–378)

## 2017-03-23 LAB — MULTIPLE MYELOMA PANEL, SERUM
ALBUMIN/GLOB SERPL: 0.9 (ref 0.7–1.7)
ALPHA 1: 0.4 g/dL (ref 0.0–0.4)
ALPHA2 GLOB SERPL ELPH-MCNC: 0.8 g/dL (ref 0.4–1.0)
Albumin SerPl Elph-Mcnc: 2.8 g/dL — ABNORMAL LOW (ref 2.9–4.4)
B-GLOBULIN SERPL ELPH-MCNC: 1.2 g/dL (ref 0.7–1.3)
Gamma Glob SerPl Elph-Mcnc: 0.9 g/dL (ref 0.4–1.8)
Globulin, Total: 3.3 g/dL (ref 2.2–3.9)
IGG (IMMUNOGLOBIN G), SERUM: 861 mg/dL (ref 700–1600)
IgA: 153 mg/dL (ref 87–352)
IgM, Serum: 203 mg/dL (ref 26–217)
TOTAL PROTEIN ELP: 6.1 g/dL (ref 6.0–8.5)

## 2017-03-24 LAB — GC/CHLAMYDIA PROBE AMP
CHLAMYDIA, DNA PROBE: NEGATIVE
NEISSERIA GONORRHOEAE BY PCR: NEGATIVE

## 2017-03-24 LAB — STREP GP B NAA+RFLX: Strep Gp B NAA+Rflx: NEGATIVE

## 2017-03-24 LAB — OB RESULTS CONSOLE GBS: GBS: NEGATIVE

## 2017-03-25 ENCOUNTER — Inpatient Hospital Stay: Payer: 59

## 2017-03-25 VITALS — BP 110/75 | HR 105 | Temp 97.5°F

## 2017-03-25 DIAGNOSIS — Z3A36 36 weeks gestation of pregnancy: Secondary | ICD-10-CM | POA: Diagnosis not present

## 2017-03-25 DIAGNOSIS — R5383 Other fatigue: Secondary | ICD-10-CM | POA: Diagnosis not present

## 2017-03-25 DIAGNOSIS — K5090 Crohn's disease, unspecified, without complications: Secondary | ICD-10-CM | POA: Diagnosis not present

## 2017-03-25 DIAGNOSIS — K589 Irritable bowel syndrome without diarrhea: Secondary | ICD-10-CM | POA: Diagnosis not present

## 2017-03-25 DIAGNOSIS — D519 Vitamin B12 deficiency anemia, unspecified: Secondary | ICD-10-CM | POA: Diagnosis not present

## 2017-03-25 DIAGNOSIS — O99013 Anemia complicating pregnancy, third trimester: Secondary | ICD-10-CM

## 2017-03-25 DIAGNOSIS — R5381 Other malaise: Secondary | ICD-10-CM | POA: Diagnosis not present

## 2017-03-25 DIAGNOSIS — D649 Anemia, unspecified: Secondary | ICD-10-CM | POA: Diagnosis not present

## 2017-03-25 DIAGNOSIS — N809 Endometriosis, unspecified: Secondary | ICD-10-CM | POA: Diagnosis not present

## 2017-03-25 MED ORDER — IRON SUCROSE 20 MG/ML IV SOLN
200.0000 mg | Freq: Once | INTRAVENOUS | Status: AC
  Administered 2017-03-25: 200 mg via INTRAVENOUS
  Filled 2017-03-25: qty 10

## 2017-03-25 MED ORDER — SODIUM CHLORIDE 0.9 % IV SOLN: 200.0000 mg | Freq: Once | INTRAVENOUS | Status: DC

## 2017-03-25 MED ORDER — SODIUM CHLORIDE 0.9 % IV SOLN
Freq: Once | INTRAVENOUS | Status: AC
  Administered 2017-03-25: 15:00:00 via INTRAVENOUS
  Filled 2017-03-25: qty 1000

## 2017-03-27 ENCOUNTER — Encounter: Payer: Self-pay | Admitting: Certified Nurse Midwife

## 2017-03-27 ENCOUNTER — Telehealth: Payer: Self-pay | Admitting: Obstetrics and Gynecology

## 2017-03-27 ENCOUNTER — Ambulatory Visit (INDEPENDENT_AMBULATORY_CARE_PROVIDER_SITE_OTHER): Payer: 59 | Admitting: Certified Nurse Midwife

## 2017-03-27 VITALS — BP 116/72 | HR 95 | Wt 167.2 lb

## 2017-03-27 DIAGNOSIS — O368130 Decreased fetal movements, third trimester, not applicable or unspecified: Secondary | ICD-10-CM | POA: Diagnosis not present

## 2017-03-27 DIAGNOSIS — Z3493 Encounter for supervision of normal pregnancy, unspecified, third trimester: Secondary | ICD-10-CM | POA: Diagnosis not present

## 2017-03-27 LAB — POCT URINALYSIS DIPSTICK
Bilirubin, UA: NEGATIVE
GLUCOSE UA: NEGATIVE
KETONES UA: NEGATIVE
Leukocytes, UA: NEGATIVE
Nitrite, UA: NEGATIVE
Protein, UA: NEGATIVE
RBC UA: NEGATIVE
Spec Grav, UA: 1.01 (ref 1.010–1.025)
UROBILINOGEN UA: 0.2 U/dL
pH, UA: 6 (ref 5.0–8.0)

## 2017-03-27 NOTE — Patient Instructions (Signed)

## 2017-03-27 NOTE — Telephone Encounter (Signed)
Patient feels like she may be leaking fluid, she is also having pressure feels like she needs to have a BM but she's afraid to go - baby is not moving like normal she is concerned please call

## 2017-03-27 NOTE — Progress Notes (Signed)
Pt presents today for decreased fetal movement and increased discharge, possible SROM. NST today shows reactive NST and category 1 strip. Baseline 140's, accelerations, no decelerations, moderate variability and no contractions present. Nitrizine negative, fern negative. No fluid seen with cough, no pooling. SVE 3cm, no change from last exam. Labor precautions reviewed. Fetal movement reviewed. Follow up in 1 wk.   Philip Aspen, CNM

## 2017-03-28 ENCOUNTER — Inpatient Hospital Stay: Payer: 59 | Admitting: *Deleted

## 2017-03-28 ENCOUNTER — Ambulatory Visit: Payer: 59 | Admitting: Oncology

## 2017-03-28 ENCOUNTER — Encounter: Payer: 59 | Admitting: Certified Nurse Midwife

## 2017-03-28 ENCOUNTER — Other Ambulatory Visit: Payer: 59 | Admitting: *Deleted

## 2017-03-28 ENCOUNTER — Encounter: Payer: Self-pay | Admitting: Oncology

## 2017-03-28 ENCOUNTER — Inpatient Hospital Stay: Payer: 59

## 2017-03-28 ENCOUNTER — Inpatient Hospital Stay (HOSPITAL_BASED_OUTPATIENT_CLINIC_OR_DEPARTMENT_OTHER): Payer: 59 | Admitting: Oncology

## 2017-03-28 VITALS — BP 118/75 | HR 88 | Temp 97.8°F | Resp 18 | Wt 167.2 lb

## 2017-03-28 DIAGNOSIS — O99013 Anemia complicating pregnancy, third trimester: Secondary | ICD-10-CM

## 2017-03-28 DIAGNOSIS — F419 Anxiety disorder, unspecified: Secondary | ICD-10-CM

## 2017-03-28 DIAGNOSIS — Z79899 Other long term (current) drug therapy: Secondary | ICD-10-CM | POA: Diagnosis not present

## 2017-03-28 DIAGNOSIS — Z3A36 36 weeks gestation of pregnancy: Secondary | ICD-10-CM

## 2017-03-28 DIAGNOSIS — Z87891 Personal history of nicotine dependence: Secondary | ICD-10-CM

## 2017-03-28 DIAGNOSIS — R5381 Other malaise: Secondary | ICD-10-CM | POA: Diagnosis not present

## 2017-03-28 DIAGNOSIS — Z880 Allergy status to penicillin: Secondary | ICD-10-CM

## 2017-03-28 DIAGNOSIS — K589 Irritable bowel syndrome without diarrhea: Secondary | ICD-10-CM | POA: Diagnosis not present

## 2017-03-28 DIAGNOSIS — K5090 Crohn's disease, unspecified, without complications: Secondary | ICD-10-CM

## 2017-03-28 DIAGNOSIS — D509 Iron deficiency anemia, unspecified: Secondary | ICD-10-CM

## 2017-03-28 DIAGNOSIS — Z8041 Family history of malignant neoplasm of ovary: Secondary | ICD-10-CM

## 2017-03-28 DIAGNOSIS — D519 Vitamin B12 deficiency anemia, unspecified: Secondary | ICD-10-CM | POA: Diagnosis not present

## 2017-03-28 DIAGNOSIS — D649 Anemia, unspecified: Secondary | ICD-10-CM

## 2017-03-28 DIAGNOSIS — K219 Gastro-esophageal reflux disease without esophagitis: Secondary | ICD-10-CM | POA: Diagnosis not present

## 2017-03-28 DIAGNOSIS — R5383 Other fatigue: Secondary | ICD-10-CM

## 2017-03-28 DIAGNOSIS — I959 Hypotension, unspecified: Secondary | ICD-10-CM

## 2017-03-28 DIAGNOSIS — N809 Endometriosis, unspecified: Secondary | ICD-10-CM

## 2017-03-28 DIAGNOSIS — Z803 Family history of malignant neoplasm of breast: Secondary | ICD-10-CM

## 2017-03-28 DIAGNOSIS — Z87440 Personal history of urinary (tract) infections: Secondary | ICD-10-CM

## 2017-03-28 LAB — BASIC METABOLIC PANEL
ANION GAP: 8 (ref 5–15)
BUN: 10 mg/dL (ref 6–20)
CHLORIDE: 104 mmol/L (ref 101–111)
CO2: 23 mmol/L (ref 22–32)
Calcium: 8.8 mg/dL — ABNORMAL LOW (ref 8.9–10.3)
Creatinine, Ser: 0.52 mg/dL (ref 0.44–1.00)
GFR calc non Af Amer: >60 mL/min (ref >60)
GLUCOSE: 110 mg/dL — AB (ref 65–99)
POTASSIUM: 3.8 mmol/L (ref 3.5–5.1)
Sodium: 135 mmol/L (ref 135–145)

## 2017-03-28 MED ORDER — IRON SUCROSE 20 MG/ML IV SOLN
200.0000 mg | Freq: Once | INTRAVENOUS | Status: AC
  Administered 2017-03-28: 200 mg via INTRAVENOUS
  Filled 2017-03-28: qty 10

## 2017-03-28 MED ORDER — SODIUM CHLORIDE 0.9% FLUSH
3.0000 mL | Freq: Once | INTRAVENOUS | Status: DC | PRN
  Filled 2017-03-28: qty 3

## 2017-03-28 MED ORDER — CYANOCOBALAMIN 1000 MCG/ML IJ SOLN
1000.0000 ug | Freq: Once | INTRAMUSCULAR | Status: AC
  Administered 2017-03-28: 1000 ug via INTRAMUSCULAR
  Filled 2017-03-28: qty 1

## 2017-03-28 MED ORDER — HEPARIN SOD (PORK) LOCK FLUSH 100 UNIT/ML IV SOLN: 500.0000 [IU] | Freq: Once | INTRAVENOUS | Status: DC | PRN

## 2017-03-28 MED ORDER — SODIUM CHLORIDE 0.9 % IV SOLN
Freq: Once | INTRAVENOUS | Status: AC
Start: 2017-03-28 — End: 2017-03-28
  Administered 2017-03-28: 15:00:00 via INTRAVENOUS
  Filled 2017-03-28: qty 1000

## 2017-03-28 MED ORDER — HEPARIN SOD (PORK) LOCK FLUSH 100 UNIT/ML IV SOLN: 250.0000 [IU] | Freq: Once | INTRAVENOUS | Status: DC | PRN

## 2017-03-28 MED ORDER — SODIUM CHLORIDE 0.9% FLUSH
10.0000 mL | INTRAVENOUS | Status: DC | PRN
  Filled 2017-03-28: qty 10

## 2017-03-28 MED ORDER — ALTEPLASE 2 MG IJ SOLR: 2.0000 mg | Freq: Once | INTRAMUSCULAR | Status: DC | PRN

## 2017-03-28 MED ORDER — SODIUM CHLORIDE 0.9 % IV SOLN: 200.0000 mg | Freq: Once | INTRAVENOUS | Status: DC

## 2017-03-28 NOTE — Progress Notes (Signed)
Here for follow up

## 2017-03-28 NOTE — Progress Notes (Signed)
Hematology/Oncology Consult note [facility]  Telephone:([phone] [phone]  Patient Care Team: Glean Hess, MD as PCP - General (Family Medicine)   Name of the patient: Sarah Edwards  [id number]  1990/12/26   Date of visit: 03/28/17  Diagnosis- iron and B12 deficiency anemia in pregnancy  Chief complaint/ Reason for visit- routine f/u  Heme/Onc history: patient is a 26 year old female who is currently [redacted] weeks pregnant and has been referred to [place] for anemia. CBC from 09/24/2016 showed white count of 7.1, H&H of 10.2/30.6 with an MCV of 80.6 and a platelet count of 196. Her H&H over the last 6 months has been between 10-11. On 03/05/2017 her H&H was 8.8/29.3. B12 levels were low normal at 253 and folate level was normal. This is her second pregnancy and her first pregnancy was about 5 years ago. She did not have any complications during that pregnancy. Within normal vaginal delivery although she says that she was anemic at that time which did get better on its own. Also reports that she had anemia outside of her pregnancy is well. She has been trying to eat iron rich foods as well as take oral iron and liquid iron but she has not been able to tolerate iron supplements due to nausea. Denies any blood loss in her urine or stool. No family history of anemia.  CBC on 03/21/2017 showed white count of 6.4, H&H of 8.7/26.8 with an MCV of 70.5 and a platelet count of 181. CMP showed low potassium of 2.9. Ferritin was low at 14. Iron studies showed low iron saturation of 3% and low serum iron of 20. ESR was elevated at 70. TSH was normal. Multiple myeloma panel did not reveal any monoclonal protein. Haptoglobin was normal. Reticulocyte count was low normal at 1.7%.   Interval history- doing well. She hopes to deliver soon within next couple of weeks  Review of systems- Review of Systems  Constitutional: Positive for malaise/fatigue. Negative for chills,  fever and weight loss.  HENT: Negative for congestion, ear discharge and nosebleeds.   Eyes: Negative for blurred vision.  Respiratory: Negative for cough, hemoptysis, sputum production, shortness of breath and wheezing.   Cardiovascular: Negative for chest pain, palpitations, orthopnea and claudication.  Gastrointestinal: Negative for abdominal pain, blood in stool, constipation, diarrhea, heartburn, melena, nausea and vomiting.  Genitourinary: Negative for dysuria, flank pain, frequency, hematuria and urgency.  Musculoskeletal: Negative for back pain, joint pain and myalgias.  Skin: Negative for rash.  Neurological: Negative for dizziness, tingling, focal weakness, seizures, weakness and headaches.  Endo/Heme/Allergies: Does not bruise/bleed easily.  Psychiatric/Behavioral: Negative for depression and suicidal ideas. The patient does not have insomnia.       Allergies  Allergen Reactions  . Azithromycin Swelling  . Amoxicillin Rash  . Penicillins Rash    Childhood reaction  . Sulfa Antibiotics Nausea And Vomiting    Constant vomiting     Past Medical History:  Diagnosis Date  . Abnormal uterine bleeding (AUB)   . Anemia   . Anxiety   . Chronic UTI   . Crohn disease ([place])   . Endometriosis   . IBS (irritable bowel syndrome)   . Painful menstrual periods   . SAB (spontaneous abortion) 01/06/2014     Past Surgical History:  Procedure Laterality Date  . COLONOSCOPY N/A 2010 and 2011   crohns  . ESOPHAGOGASTRODUODENOSCOPY ENDOSCOPY N/A 2010  . laproscopy  2012    Social History  Social History  . Marital status: Single    Spouse name: N/A  . Number of children: N/A  . Years of education: N/A   Occupational History  .      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 Smoking status: Former Smoker    Packs/day: 0.50    Years: 3.00    Types: Cigarettes    Quit date: 03/18/2015  . Smokeless tobacco: Never Used  . Alcohol use No      Comment: occas - prior to pregnancy.  . Drug use: No  . Sexual activity: Yes    Birth control/ protection: None   Other Topics Concern  . Not on file   Social History Narrative   Lives in [place] with husband and dtr.  Does not routinely exercise.    Family History  Problem Relation Age of Onset  . Cancer Paternal Grandmother        breast   . Ovarian cancer Paternal Grandmother   . Cervical cancer Paternal Grandmother   . Lung cancer Paternal Grandfather   . Leukemia Maternal Grandfather   . Cancer Maternal Grandfather   . Diabetes Mother   . Diabetes Father   . Cancer Paternal Aunt      Current Outpatient Prescriptions:  .  potassium chloride SA (K-DUR,KLOR-CON) 20 MEQ tablet, Take 2 tablets (40 mEq total) by mouth daily., Disp: 14 tablet, Rfl: 0 .  Prenatal Vit-Fe Fumarate-FA (PRENATAL MULTIVITAMIN) TABS tablet, Take 1 tablet by mouth daily at 12 noon., Disp: , Rfl:  .  loperamide (IMODIUM) 2 MG capsule, Take 2 capsules (4 mg total) by mouth as needed for diarrhea or loose stools. (Patient not taking: Reported on 03/21/2017), Disp: 30 capsule, Rfl: 0 .  ondansetron (ZOFRAN-ODT) 8 MG disintegrating tablet, Take 1 tablet (8 mg total) by mouth every 8 (eight) hours as needed for nausea or vomiting. (Patient not taking: Reported on 03/21/2017), Disp: 20 tablet, Rfl: 0 .  promethazine (PHENERGAN) 12.5 MG suppository, Place 1 suppository (12.5 mg total) rectally every 6 (six) hours as needed for nausea or vomiting. (Patient not taking: Reported on 03/21/2017), Disp: 12 each, Rfl: 0 No current facility-administered medications for this visit.   Facility-Administered Medications Ordered in Other Visits:  .  alteplase (CATHFLO ACTIVASE) injection 2 mg, 2 mg, Intracatheter, Once PRN, Sindy Guadeloupe, MD .  heparin lock flush 100 unit/mL, 250 Units, Intracatheter, Once PRN, Sindy Guadeloupe, MD .  heparin lock flush 100 unit/mL, 500 Units, Intracatheter, Once PRN, Sindy Guadeloupe, MD .  sodium  chloride flush (NS) 0.9 % injection 10 mL, 10 mL, Intracatheter, PRN, Sindy Guadeloupe, MD .  sodium chloride flush (NS) 0.9 % injection 3 mL, 3 mL, Intravenous, Once PRN, Sindy Guadeloupe, MD  Physical exam:  Vitals:   03/28/17 1416  BP: 118/75  Pulse: 88  Resp: 18  Temp: 97.8 F (36.6 C)  TempSrc: Tympanic  Weight: 167 lb 3.2 oz (75.8 kg)   Physical Exam  Constitutional: She is oriented to person, place, and time and well-developed, well-nourished, and in no distress.  HENT:  Head: Normocephalic and atraumatic.  Eyes: Pupils are equal, round, and reactive to light. EOM are normal.  Neck: Normal range of motion.  Cardiovascular: Normal rate, regular rhythm and normal heart sounds.   Pulmonary/Chest: Effort normal and breath sounds normal.  Abdominal:  Gravid uterus  Neurological: She is alert and oriented to person, place, and time.  Skin: Skin  is warm and dry.     CMP Latest Ref Rng & Units 03/28/2017  Glucose 65 - 99 mg/dL 110(H)  BUN 6 - 20 mg/dL 10  Creatinine 0.44 - 1.00 mg/dL 0.52  Sodium 135 - 145 mmol/L 135  Potassium 3.5 - 5.1 mmol/L 3.8  Chloride 101 - 111 mmol/L 104  CO2 22 - 32 mmol/L 23  Calcium 8.9 - 10.3 mg/dL 8.8(L)  Total Protein 6.5 - 8.1 g/dL -  Total Bilirubin 0.3 - 1.2 mg/dL -  Alkaline Phos 38 - 126 U/L -  AST 15 - 41 U/L -  ALT 14 - 54 U/L -   CBC Latest Ref Rng & Units 03/21/2017  WBC 3.6 - 11.0 K/uL 6.4  Hemoglobin 12.0 - 16.0 g/dL 8.7(L)  Hematocrit 35.0 - 47.0 % 26.8(L)  Platelets 150 - 440 K/uL 181      Assessment and plan- Patient is a 26 y.o. female with iron and B12 deficiency anemia of pregnancy  She will receive dose 3 of venofer today. If she does not deliver next week- she will go on to receive 2 more doses of  venofer next week. She will also get weekly B12  I will see her back in 3 weeks with repeat cbc and possible venofer   Visit Diagnosis 1. Iron deficiency anemia, unspecified iron deficiency anemia type   2. Anemia during  pregnancy in third trimester      Dr. Randa Evens, MD, MPH [facility] Pager- [phone] 03/28/2017 4:57 PM

## 2017-03-30 ENCOUNTER — Observation Stay
Admission: EM | Admit: 2017-03-30 | Discharge: 2017-03-30 | Disposition: A | Discharge status: Home / Self Care | Payer: 59 | Attending: Certified Nurse Midwife | Admitting: Certified Nurse Midwife

## 2017-03-30 DIAGNOSIS — O99013 Anemia complicating pregnancy, third trimester: Secondary | ICD-10-CM | POA: Insufficient documentation

## 2017-03-30 DIAGNOSIS — Z3490 Encounter for supervision of normal pregnancy, unspecified, unspecified trimester: Secondary | ICD-10-CM

## 2017-03-30 DIAGNOSIS — Z880 Allergy status to penicillin: Secondary | ICD-10-CM | POA: Insufficient documentation

## 2017-03-30 DIAGNOSIS — D649 Anemia, unspecified: Secondary | ICD-10-CM | POA: Diagnosis not present

## 2017-03-30 DIAGNOSIS — Z3A37 37 weeks gestation of pregnancy: Secondary | ICD-10-CM | POA: Insufficient documentation

## 2017-03-30 DIAGNOSIS — O471 False labor at or after 37 completed weeks of gestation: Secondary | ICD-10-CM | POA: Diagnosis not present

## 2017-03-30 MED ORDER — HYDROXYZINE HCL 25 MG PO TABS: 25.0000 mg | ORAL_TABLET | Freq: Four times a day (QID) | ORAL | 0 refills | Status: DC | PRN

## 2017-03-30 NOTE — Discharge Instructions (Signed)
discharge instructions and labor precautions reviewed, all questions answered.

## 2017-04-01 ENCOUNTER — Telehealth: Payer: Self-pay | Admitting: Certified Nurse Midwife

## 2017-04-01 ENCOUNTER — Inpatient Hospital Stay: Payer: 59

## 2017-04-01 NOTE — Telephone Encounter (Signed)
Patient's job has taken her out of work - she now needs the FMLA forms to show that the beginning date should be 03/27/2017 return date 06/17/2017. Due to all the treatments she is having they don't want her to return until after delivery. Please call if you need any more information

## 2017-04-01 NOTE — Telephone Encounter (Signed)
Pt needs her fmla to start on 7/11 instead of 8/1. She has started weekly iron infusions. Pt aware fmla changed. Will put up front for pt to p/u at appt on 7/17.

## 2017-04-01 NOTE — Discharge Summary (Signed)
Physician Obstetric Discharge Summary  Patient ID: Sarah Edwards MRN: [id number] DOB/AGE: 10-20-1990 26 y.o.   Date of Admission: 03/30/2017  Date of Discharge: 03/30/2017  Admitting Diagnosis: Uterine contractions at term, rule out labor at [redacted]w[redacted]d Secondary Diagnosis: Anemia in pregnancy     Discharge Diagnosis: BMontine CircleContractions   Antepartum Procedures: NST  Brief Hospital Course   L&D OB Triage Note  Sarah Shiflettis a 26y.o. GG27P1011female at 341w5dEDD Estimated Date of Delivery: 04/17/17 who presented to triage for complaints of regular uterine contractions.  She was evaluated by the nurses with no significant findings for active labor or fetal distress. Vital signs stable. An NST was performed and has been reviewed by CNM. No treatment was required.   NST INTERPRETATION: Indications: rule out uterine contractions  Mode: External Baseline Rate (A): 140 bpm Variability: Moderate Accelerations: 15 x 15 Decelerations: None  Contraction Frequency (min): irritabilty   Dilation: 1.5 (externally 3) Effacement (%): 40 Cervical Position: Posterior Station: -2 Exam by:: [id number]  Impression: reactive  Plan: NST performed was reviewed and was found to be reactive. She was discharged home with bleeding/labor precautions.  Continue routine prenatal care. Follow up with OB/GYN as previously scheduled.   Discharge Instructions: Per After Visit Summary.  Activity: Advance as tolerated.  Also refer to After Visit Summary  Diet: Regular  Medications:  Allergies as of 03/30/2017      Reactions   Azithromycin Swelling   Amoxicillin Rash   Penicillins Rash   Childhood reaction   Sulfa Antibiotics Nausea And Vomiting   Constant vomiting      Medication List    STOP taking these medications   loperamide 2 MG capsule Commonly known as:  IMODIUM     TAKE these medications   hydrOXYzine 25 MG tablet Commonly known as:  ATARAX/VISTARIL Take 1 tablet  (25 mg total) by mouth every 6 (six) hours as needed (contractions).   ondansetron 8 MG disintegrating tablet Commonly known as:  ZOFRAN-ODT Take 1 tablet (8 mg total) by mouth every 8 (eight) hours as needed for nausea or vomiting.   potassium chloride SA 20 MEQ tablet Commonly known as:  K-DUR,KLOR-CON Take 2 tablets (40 mEq total) by mouth daily.   prenatal multivitamin Tabs tablet Take 1 tablet by mouth daily at 12 noon.   promethazine 12.5 MG suppository Commonly known as:  PHENERGAN Place 1 suppository (12.5 mg total) rectally every 6 (six) hours as needed for nausea or vomiting.      Outpatient follow up:    Postpartum contraception: oral contraceptives (estrogen/progesterone)  Discharged Condition: stable  Discharged to: home   Sarah [place]orth [place]

## 2017-04-02 ENCOUNTER — Inpatient Hospital Stay: Payer: 59

## 2017-04-02 VITALS — BP 123/79 | HR 88 | Temp 98.1°F | Resp 18

## 2017-04-02 DIAGNOSIS — Z3A36 36 weeks gestation of pregnancy: Secondary | ICD-10-CM | POA: Diagnosis not present

## 2017-04-02 DIAGNOSIS — O99013 Anemia complicating pregnancy, third trimester: Secondary | ICD-10-CM | POA: Diagnosis not present

## 2017-04-02 DIAGNOSIS — N809 Endometriosis, unspecified: Secondary | ICD-10-CM | POA: Diagnosis not present

## 2017-04-02 DIAGNOSIS — K589 Irritable bowel syndrome without diarrhea: Secondary | ICD-10-CM | POA: Diagnosis not present

## 2017-04-02 DIAGNOSIS — K5090 Crohn's disease, unspecified, without complications: Secondary | ICD-10-CM | POA: Diagnosis not present

## 2017-04-02 DIAGNOSIS — D519 Vitamin B12 deficiency anemia, unspecified: Secondary | ICD-10-CM | POA: Diagnosis not present

## 2017-04-02 DIAGNOSIS — R5383 Other fatigue: Secondary | ICD-10-CM | POA: Diagnosis not present

## 2017-04-02 DIAGNOSIS — D649 Anemia, unspecified: Secondary | ICD-10-CM | POA: Diagnosis not present

## 2017-04-02 DIAGNOSIS — R5381 Other malaise: Secondary | ICD-10-CM | POA: Diagnosis not present

## 2017-04-02 MED ORDER — IRON SUCROSE 20 MG/ML IV SOLN
200.0000 mg | Freq: Once | INTRAVENOUS | Status: AC
  Administered 2017-04-02: 200 mg via INTRAVENOUS
  Filled 2017-04-02: qty 10

## 2017-04-02 MED ORDER — SODIUM CHLORIDE 0.9 % IV SOLN
Freq: Once | INTRAVENOUS | Status: AC
  Administered 2017-04-02: 15:00:00 via INTRAVENOUS
  Filled 2017-04-02: qty 1000

## 2017-04-02 MED ORDER — SODIUM CHLORIDE 0.9 % IV SOLN: 200.0000 mg | Freq: Once | INTRAVENOUS | Status: DC

## 2017-04-04 ENCOUNTER — Inpatient Hospital Stay: Payer: 59

## 2017-04-04 ENCOUNTER — Ambulatory Visit (INDEPENDENT_AMBULATORY_CARE_PROVIDER_SITE_OTHER): Payer: 59 | Admitting: Certified Nurse Midwife

## 2017-04-04 ENCOUNTER — Encounter: Payer: Self-pay | Admitting: Certified Nurse Midwife

## 2017-04-04 VITALS — BP 113/77 | HR 97 | Wt 169.7 lb

## 2017-04-04 VITALS — BP 112/75 | HR 87 | Temp 98.0°F | Resp 18

## 2017-04-04 DIAGNOSIS — O99013 Anemia complicating pregnancy, third trimester: Secondary | ICD-10-CM | POA: Diagnosis not present

## 2017-04-04 DIAGNOSIS — D649 Anemia, unspecified: Secondary | ICD-10-CM | POA: Diagnosis not present

## 2017-04-04 DIAGNOSIS — N809 Endometriosis, unspecified: Secondary | ICD-10-CM | POA: Diagnosis not present

## 2017-04-04 DIAGNOSIS — D519 Vitamin B12 deficiency anemia, unspecified: Secondary | ICD-10-CM | POA: Diagnosis not present

## 2017-04-04 DIAGNOSIS — K5090 Crohn's disease, unspecified, without complications: Secondary | ICD-10-CM | POA: Diagnosis not present

## 2017-04-04 DIAGNOSIS — Z3493 Encounter for supervision of normal pregnancy, unspecified, third trimester: Secondary | ICD-10-CM

## 2017-04-04 DIAGNOSIS — K589 Irritable bowel syndrome without diarrhea: Secondary | ICD-10-CM | POA: Diagnosis not present

## 2017-04-04 DIAGNOSIS — R5381 Other malaise: Secondary | ICD-10-CM | POA: Diagnosis not present

## 2017-04-04 DIAGNOSIS — Z3A36 36 weeks gestation of pregnancy: Secondary | ICD-10-CM | POA: Diagnosis not present

## 2017-04-04 DIAGNOSIS — R5383 Other fatigue: Secondary | ICD-10-CM | POA: Diagnosis not present

## 2017-04-04 LAB — POCT URINALYSIS DIPSTICK
BILIRUBIN UA: NEGATIVE
Blood, UA: NEGATIVE
Glucose, UA: NEGATIVE
Ketones, UA: NEGATIVE
Leukocytes, UA: NEGATIVE
NITRITE UA: NEGATIVE
Protein, UA: NEGATIVE
Spec Grav, UA: 1.02 (ref 1.010–1.025)
UROBILINOGEN UA: 0.2 U/dL
pH, UA: 6.5 (ref 5.0–8.0)

## 2017-04-04 MED ORDER — IRON SUCROSE 20 MG/ML IV SOLN
200.0000 mg | Freq: Once | INTRAVENOUS | Status: AC
  Administered 2017-04-04: 200 mg via INTRAVENOUS
  Filled 2017-04-04: qty 10

## 2017-04-04 MED ORDER — SODIUM CHLORIDE 0.9 % IV SOLN: 200.0000 mg | Freq: Once | INTRAVENOUS | Status: DC

## 2017-04-04 MED ORDER — CYANOCOBALAMIN 1000 MCG/ML IJ SOLN
1000.0000 ug | Freq: Once | INTRAMUSCULAR | Status: AC
  Administered 2017-04-04: 1000 ug via INTRAMUSCULAR
  Filled 2017-04-04: qty 1

## 2017-04-04 NOTE — Progress Notes (Signed)
ROB-Pt doing well, irregular contractions continue-good fetal movement. Iron infusion earlier today, repeat lab work scheduled for 04/18/2017. Discussed labor stimulating techniques including evening primrose oil, use of birth ball, pineapple, use of breast pump, prostaglandin release and walking. Anticipatory guidance regarding practice policies and induction of labor. Reviewed red flag symptoms and when to call. RTC x 1 week for ROB or sooner if needed.

## 2017-04-04 NOTE — Patient Instructions (Signed)
Vaginal Delivery Vaginal delivery means that you will give birth by pushing your baby out of your birth canal (vagina). A team of health care providers will help you before, during, and after vaginal delivery. Birth experiences are unique for every woman and every pregnancy, and birth experiences vary depending on where you choose to give birth. What should I do to prepare for my baby's birth? Before your baby is born, it is important to talk with your health care provider about:  Your labor and delivery preferences. These may include: ? Medicines that you may be given. ? How you will manage your pain. This might include non-medical pain relief techniques or injectable pain relief such as epidural analgesia. ? How you and your baby will be monitored during labor and delivery. ? Who may be in the labor and delivery room with you. ? Your feelings about surgical delivery of your baby (cesarean delivery, or C-section) if this becomes necessary. ? Your feelings about receiving donated blood through an IV tube (blood transfusion) if this becomes necessary.  Whether you are able: ? To take pictures or videos of the birth. ? To eat during labor and delivery. ? To move around, walk, or change positions during labor and delivery.  What to expect after your baby is born, such as: ? Whether delayed umbilical cord clamping and cutting is offered. ? Who will care for your baby right after birth. ? Medicines or tests that may be recommended for your baby. ? Whether breastfeeding is supported in your hospital or birth center. ? How long you will be in the hospital or birth center.  How any medical conditions you have may affect your baby or your labor and delivery experience.  To prepare for your baby's birth, you should also:  Attend all of your health care visits before delivery (prenatal visits) as recommended by your health care provider. This is important.  Prepare your home for your baby's  arrival. Make sure that you have: ? Diapers. ? Baby clothing. ? Feeding equipment. ? Safe sleeping arrangements for you and your baby.  Install a car seat in your vehicle. Have your car seat checked by a certified car seat installer to make sure that it is installed safely.  Think about who will help you with your new baby at home for at least the first several weeks after delivery.  What can I expect when I arrive at the birth center or hospital? Once you are in labor and have been admitted into the hospital or birth center, your health care provider may:  Review your pregnancy history and any concerns you have.  Insert an IV tube into one of your veins. This is used to give you fluids and medicines.  Check your blood pressure, pulse, temperature, and heart rate (vital signs).  Check whether your bag of water (amniotic sac) has broken (ruptured).  Talk with you about your birth plan and discuss pain control options.  Monitoring Your health care provider may monitor your contractions (uterine monitoring) and your baby's heart rate (fetal monitoring). You may need to be monitored:  Often, but not continuously (intermittently).  All the time or for long periods at a time (continuously). Continuous monitoring may be needed if: ? You are taking certain medicines, such as medicine to relieve pain or make your contractions stronger. ? You have pregnancy or labor complications.  Monitoring may be done by:  Placing a special stethoscope or a handheld monitoring device on your abdomen to  check your baby's heartbeat, and feeling your abdomen for contractions. This method of monitoring does not continuously record your baby's heartbeat or your contractions.  Placing monitors on your abdomen (external monitors) to record your baby's heartbeat and the frequency and length of contractions. You may not have to wear external monitors all the time.  Placing monitors inside of your uterus  (internal monitors) to record your baby's heartbeat and the frequency, length, and strength of your contractions. ? Your health care provider may use internal monitors if he or she needs more information about the strength of your contractions or your baby's heart rate. ? Internal monitors are put in place by passing a thin, flexible wire through your vagina and into your uterus. Depending on the type of monitor, it may remain in your uterus or on your baby's head until birth. ? Your health care provider will discuss the benefits and risks of internal monitoring with you and will ask for your permission before inserting the monitors.  Telemetry. This is a type of continuous monitoring that can be done with external or internal monitors. Instead of having to stay in bed, you are able to move around during telemetry. Ask your health care provider if telemetry is an option for you.  Physical exam Your health care provider may perform a physical exam. This may include:  Checking whether your baby is positioned: ? With the head toward your vagina (head-down). This is most common. ? With the head toward the top of your uterus (head-up or breech). If your baby is in a breech position, your health care provider may try to turn your baby to a head-down position so you can deliver vaginally. If it does not seem that your baby can be born vaginally, your provider may recommend surgery to deliver your baby. In rare cases, you may be able to deliver vaginally if your baby is head-up (breech delivery). ? Lying sideways (transverse). Babies that are lying sideways cannot be delivered vaginally.  Checking your cervix to determine: ? Whether it is thinning out (effacing). ? Whether it is opening up (dilating). ? How low your baby has moved into your birth canal.  What are the three stages of labor and delivery?  Normal labor and delivery is divided into the following three stages: Stage 1  Stage 1 is the  longest stage of labor, and it can last for hours or days. Stage 1 includes: ? Early labor. This is when contractions may be irregular, or regular and mild. Generally, early labor contractions are more than 10 minutes apart. ? Active labor. This is when contractions get longer, more regular, more frequent, and more intense. ? The transition phase. This is when contractions happen very close together, are very intense, and may last longer than during any other part of labor.  Contractions generally feel mild, infrequent, and irregular at first. They get stronger, more frequent (about every 2-3 minutes), and more regular as you progress from early labor through active labor and transition.  Many women progress through stage 1 naturally, but you may need help to continue making progress. If this happens, your health care provider may talk with you about: ? Rupturing your amniotic sac if it has not ruptured yet. ? Giving you medicine to help make your contractions stronger and more frequent.  Stage 1 ends when your cervix is completely dilated to 4 inches (10 cm) and completely effaced. This happens at the end of the transition phase. Stage 2  Once  your cervix is completely effaced and dilated to 4 inches (10 cm), you may start to feel an urge to push. It is common for the body to naturally take a rest before feeling the urge to push, especially if you received an epidural or certain other pain medicines. This rest period may last for up to 1-2 hours, depending on your unique labor experience.  During stage 2, contractions are generally less painful, because pushing helps relieve contraction pain. Instead of contraction pain, you may feel stretching and burning pain, especially when the widest part of your baby's head passes through the vaginal opening (crowning).  Your health care provider will closely monitor your pushing progress and your baby's progress through the vagina during stage 2.  Your  health care provider may massage the area of skin between your vaginal opening and anus (perineum) or apply warm compresses to your perineum. This helps it stretch as the baby's head starts to crown, which can help prevent perineal tearing. ? In some cases, an incision may be made in your perineum (episiotomy) to allow the baby to pass through the vaginal opening. An episiotomy helps to make the opening of the vagina larger to allow more room for the baby to fit through.  It is very important to breathe and focus so your health care provider can control the delivery of your baby's head. Your health care provider may have you decrease the intensity of your pushing, to help prevent perineal tearing.  After delivery of your baby's head, the shoulders and the rest of the body generally deliver very quickly and without difficulty.  Once your baby is delivered, the umbilical cord may be cut right away, or this may be delayed for 1-2 minutes, depending on your baby's health. This may vary among health care providers, hospitals, and birth centers.  If you and your baby are healthy enough, your baby may be placed on your chest or abdomen to help maintain the baby's temperature and to help you bond with each other. Some mothers and babies start breastfeeding at this time. Your health care team will dry your baby and help keep your baby warm during this time.  Your baby may need immediate care if he or she: ? Showed signs of distress during labor. ? Has a medical condition. ? Was born too early (prematurely). ? Had a bowel movement before birth (meconium). ? Shows signs of difficulty transitioning from being inside the uterus to being outside of the uterus. If you are planning to breastfeed, your health care team will help you begin a feeding. Stage 3  The third stage of labor starts immediately after the birth of your baby and ends after you deliver the placenta. The placenta is an organ that develops  during pregnancy to provide oxygen and nutrients to your baby in the womb.  Delivering the placenta may require some pushing, and you may have mild contractions. Breastfeeding can stimulate contractions to help you deliver the placenta.  After the placenta is delivered, your uterus should tighten (contract) and become firm. This helps to stop bleeding in your uterus. To help your uterus contract and to control bleeding, your health care provider may: ? Give you medicine by injection, through an IV tube, by mouth, or through your rectum (rectally). ? Massage your abdomen or perform a vaginal exam to remove any blood clots that are left in your uterus. ? Empty your bladder by placing a thin, flexible tube (catheter) into your bladder. ? Encourage  you to breastfeed your baby. After labor is over, you and your baby will be monitored closely to ensure that you are both healthy until you are ready to go home. Your health care team will teach you how to care for yourself and your baby. This information is not intended to replace advice given to you by your health care provider. Make sure you discuss any questions you have with your health care provider. Document Released: 06/12/2008 Document Revised: 03/23/2016 Document Reviewed: 09/18/2015 Elsevier Interactive Patient Education  2018 [employer].

## 2017-04-11 ENCOUNTER — Inpatient Hospital Stay: Payer: 59

## 2017-04-11 ENCOUNTER — Ambulatory Visit (INDEPENDENT_AMBULATORY_CARE_PROVIDER_SITE_OTHER): Payer: 59 | Admitting: Obstetrics and Gynecology

## 2017-04-11 VITALS — BP 123/78 | HR 100 | Wt 172.4 lb

## 2017-04-11 DIAGNOSIS — N809 Endometriosis, unspecified: Secondary | ICD-10-CM | POA: Diagnosis not present

## 2017-04-11 DIAGNOSIS — R5383 Other fatigue: Secondary | ICD-10-CM | POA: Diagnosis not present

## 2017-04-11 DIAGNOSIS — O99013 Anemia complicating pregnancy, third trimester: Secondary | ICD-10-CM | POA: Diagnosis not present

## 2017-04-11 DIAGNOSIS — D519 Vitamin B12 deficiency anemia, unspecified: Secondary | ICD-10-CM | POA: Diagnosis not present

## 2017-04-11 DIAGNOSIS — K5090 Crohn's disease, unspecified, without complications: Secondary | ICD-10-CM | POA: Diagnosis not present

## 2017-04-11 DIAGNOSIS — Z3493 Encounter for supervision of normal pregnancy, unspecified, third trimester: Secondary | ICD-10-CM

## 2017-04-11 DIAGNOSIS — Z3A36 36 weeks gestation of pregnancy: Secondary | ICD-10-CM | POA: Diagnosis not present

## 2017-04-11 DIAGNOSIS — K589 Irritable bowel syndrome without diarrhea: Secondary | ICD-10-CM | POA: Diagnosis not present

## 2017-04-11 DIAGNOSIS — R5381 Other malaise: Secondary | ICD-10-CM | POA: Diagnosis not present

## 2017-04-11 DIAGNOSIS — D649 Anemia, unspecified: Secondary | ICD-10-CM | POA: Diagnosis not present

## 2017-04-11 LAB — POCT URINALYSIS DIPSTICK
BILIRUBIN UA: NEGATIVE
Blood, UA: NEGATIVE
Glucose, UA: NEGATIVE
KETONES UA: NEGATIVE
LEUKOCYTES UA: NEGATIVE
Nitrite, UA: NEGATIVE
Protein, UA: NEGATIVE
Spec Grav, UA: 1.01 (ref 1.010–1.025)
Urobilinogen, UA: 0.2 E.U./dL
pH, UA: 6 (ref 5.0–8.0)

## 2017-04-11 MED ORDER — CYANOCOBALAMIN 1000 MCG/ML IJ SOLN
1000.0000 ug | Freq: Once | INTRAMUSCULAR | Status: AC
  Administered 2017-04-11: 1000 ug via INTRAMUSCULAR
  Filled 2017-04-11: qty 1

## 2017-04-11 NOTE — Progress Notes (Signed)
ROB- pt is having lots of pelvic pressure, having lower abd cramping

## 2017-04-11 NOTE — Progress Notes (Signed)
ROB-states got B12 injection today, discussed labor precautions.

## 2017-04-12 ENCOUNTER — Other Ambulatory Visit: Payer: Self-pay | Admitting: Obstetrics and Gynecology

## 2017-04-12 DIAGNOSIS — O480 Post-term pregnancy: Secondary | ICD-10-CM

## 2017-04-16 ENCOUNTER — Other Ambulatory Visit: Payer: Self-pay | Admitting: *Deleted

## 2017-04-16 DIAGNOSIS — O99013 Anemia complicating pregnancy, third trimester: Secondary | ICD-10-CM

## 2017-04-17 ENCOUNTER — Encounter: Payer: Self-pay | Admitting: Anesthesiology

## 2017-04-17 ENCOUNTER — Ambulatory Visit (INDEPENDENT_AMBULATORY_CARE_PROVIDER_SITE_OTHER): Payer: 59

## 2017-04-17 ENCOUNTER — Ambulatory Visit (INDEPENDENT_AMBULATORY_CARE_PROVIDER_SITE_OTHER): Payer: 59 | Admitting: Certified Nurse Midwife

## 2017-04-17 ENCOUNTER — Inpatient Hospital Stay
Admission: EM | Admit: 2017-04-17 | Discharge: 2017-04-19 | DRG: 767 | Disposition: A | Discharge status: Home / Self Care | Payer: 59 | Attending: Certified Nurse Midwife | Admitting: Certified Nurse Midwife

## 2017-04-17 VITALS — BP 109/72 | HR 87 | Wt 173.8 lb

## 2017-04-17 DIAGNOSIS — D649 Anemia, unspecified: Secondary | ICD-10-CM | POA: Diagnosis not present

## 2017-04-17 DIAGNOSIS — Z87891 Personal history of nicotine dependence: Secondary | ICD-10-CM | POA: Diagnosis not present

## 2017-04-17 DIAGNOSIS — Z3A40 40 weeks gestation of pregnancy: Secondary | ICD-10-CM | POA: Diagnosis not present

## 2017-04-17 DIAGNOSIS — O9902 Anemia complicating childbirth: Secondary | ICD-10-CM | POA: Diagnosis present

## 2017-04-17 DIAGNOSIS — O4103X0 Oligohydramnios, third trimester, not applicable or unspecified: Secondary | ICD-10-CM | POA: Diagnosis present

## 2017-04-17 DIAGNOSIS — Z1371 Encounter for nonprocreative screening for genetic disease carrier status: Secondary | ICD-10-CM | POA: Diagnosis present

## 2017-04-17 DIAGNOSIS — Z880 Allergy status to penicillin: Secondary | ICD-10-CM

## 2017-04-17 DIAGNOSIS — Z3493 Encounter for supervision of normal pregnancy, unspecified, third trimester: Secondary | ICD-10-CM

## 2017-04-17 DIAGNOSIS — Z3490 Encounter for supervision of normal pregnancy, unspecified, unspecified trimester: Secondary | ICD-10-CM | POA: Diagnosis present

## 2017-04-17 DIAGNOSIS — O99013 Anemia complicating pregnancy, third trimester: Secondary | ICD-10-CM | POA: Diagnosis not present

## 2017-04-17 DIAGNOSIS — O480 Post-term pregnancy: Secondary | ICD-10-CM

## 2017-04-17 LAB — CBC
HCT: 33.1 % — ABNORMAL LOW (ref 35.0–47.0)
Hemoglobin: 10.8 g/dL — ABNORMAL LOW (ref 12.0–16.0)
MCH: 26.4 pg (ref 26.0–34.0)
MCHC: 32.6 g/dL (ref 32.0–36.0)
MCV: 81 fL (ref 80.0–100.0)
PLATELETS: 170 10*3/uL (ref 150–440)
RBC: 4.08 MIL/uL (ref 3.80–5.20)
RDW: 30.8 % — AB (ref 11.5–14.5)
WBC: 8.7 10*3/uL (ref 3.6–11.0)

## 2017-04-17 LAB — POCT URINALYSIS DIPSTICK
BILIRUBIN UA: NEGATIVE
Glucose, UA: NEGATIVE
KETONES UA: NEGATIVE
LEUKOCYTES UA: NEGATIVE
NITRITE UA: NEGATIVE
PH UA: 6 (ref 5.0–8.0)
RBC UA: NEGATIVE
Spec Grav, UA: 1.01 (ref 1.010–1.025)
Urobilinogen, UA: 0.2 E.U./dL

## 2017-04-17 MED ORDER — ONDANSETRON HCL 4 MG/2ML IJ SOLN
4.0000 mg | Freq: Four times a day (QID) | INTRAMUSCULAR | Status: DC | PRN
  Administered 2017-04-18: 4 mg via INTRAVENOUS
  Filled 2017-04-17: qty 2

## 2017-04-17 MED ORDER — LACTATED RINGERS IV SOLN
500.0000 mL | INTRAVENOUS | Status: DC | PRN
  Administered 2017-04-18: 500 mL via INTRAVENOUS

## 2017-04-17 MED ORDER — LIDOCAINE HCL (PF) 1 % IJ SOLN: 30.0000 mL | INTRAMUSCULAR | Status: DC | PRN

## 2017-04-17 MED ORDER — OXYTOCIN 40 UNITS IN LACTATED RINGERS INFUSION - SIMPLE MED
2.5000 [IU]/h | INTRAVENOUS | Status: DC
  Filled 2017-04-17: qty 1000

## 2017-04-17 MED ORDER — OXYCODONE-ACETAMINOPHEN 5-325 MG PO TABS: 2.0000 | ORAL_TABLET | ORAL | Status: DC | PRN

## 2017-04-17 MED ORDER — ZOLPIDEM TARTRATE 5 MG PO TABS: 5.0000 mg | ORAL_TABLET | Freq: Every evening | ORAL | Status: DC | PRN

## 2017-04-17 MED ORDER — OXYTOCIN BOLUS FROM INFUSION: 500.0000 mL | Freq: Once | INTRAVENOUS | Status: DC

## 2017-04-17 MED ORDER — ACETAMINOPHEN 325 MG PO TABS: 650.0000 mg | ORAL_TABLET | ORAL | Status: DC | PRN

## 2017-04-17 MED ORDER — LACTATED RINGERS IV SOLN
INTRAVENOUS | Status: DC
  Administered 2017-04-17: 21:00:00 via INTRAVENOUS

## 2017-04-17 MED ORDER — TERBUTALINE SULFATE 1 MG/ML IJ SOLN: 0.2500 mg | Freq: Once | INTRAMUSCULAR | Status: DC | PRN

## 2017-04-17 MED ORDER — OXYCODONE-ACETAMINOPHEN 5-325 MG PO TABS: 1.0000 | ORAL_TABLET | ORAL | Status: DC | PRN

## 2017-04-17 MED ORDER — FLEET ENEMA 7-19 GM/118ML RE ENEM: 1.0000 | ENEMA | Freq: Every day | RECTAL | Status: DC | PRN

## 2017-04-17 MED ORDER — BUTORPHANOL TARTRATE 1 MG/ML IJ SOLN
1.0000 mg | INTRAMUSCULAR | Status: DC | PRN
  Administered 2017-04-17: 1 mg via INTRAVENOUS
  Filled 2017-04-17: qty 1

## 2017-04-17 MED ORDER — SOD CITRATE-CITRIC ACID 500-334 MG/5ML PO SOLN: 30.0000 mL | ORAL | Status: DC | PRN

## 2017-04-17 MED ORDER — MISOPROSTOL 25 MCG QUARTER TABLET: 25.0000 ug | ORAL_TABLET | ORAL | Status: DC | PRN

## 2017-04-17 MED ORDER — OXYTOCIN 40 UNITS IN LACTATED RINGERS INFUSION - SIMPLE MED
1.0000 m[IU]/min | INTRAVENOUS | Status: DC
  Administered 2017-04-17: 2 m[IU]/min via INTRAVENOUS
  Administered 2017-04-18: 666 m[IU]/min via INTRAVENOUS

## 2017-04-17 NOTE — Patient Instructions (Signed)
Oligohydramnios Oligohydramnios is a conditionin which there is not enough fluid in the sac (amniotic sac) that surrounds your unborn baby (fetus). The amniotic sac in the uterus contains fluid (amniotic fluid) that:  Protects your baby from injury (trauma) and infections.  Helps your baby move freely inside the uterus.  Helps your baby's lungs, kidneys, and digestive system to develop.  This condition occurs before birth (is a prenatal condition), and it can interfere with your baby's normal prenatal growth and development. Oligohydramnios can happen any time during pregnancy, and it most commonly occurs during the last 3 months (third trimester). It is most likely to cause serious complications when it occurs early in pregnancy. Some possible complications of this condition include:  Early (premature) birth.  Birth defects.  Limited (restricted) fetal growth.  Decreased oxygen flow to the fetus due to pressure on the umbilical cord.  Pregnancy loss (miscarriage).  Stillbirth.  What are the causes? This condition may be caused by:  A leak or tear in the amniotic sac.  A problem with the organ that nourishes the baby in the uterus (placenta), such as failure of the placenta to provide enough blood, fluid, and nutrients to the baby.  Having identical twins who share the same placenta.  A fetal birth defect. This is usually an abnormality in the fetal kidneys or urinary tract.  A pregnancy that goes past the due date.  A condition that the mother has, such as: ? A lack of fluids in the body (dehydration). ? High blood pressure. ? Diabetes. ? Reactions to certain medicines, such as ibuprofen or blood pressure medicines (ACE inhibitors). ? Systemic lupus.  In some cases, the cause is unknown. What increases the risk? This condition is more likely to develop if you:  Become dehydrated.  Have high blood pressure.  Take NSAIDs or ACE inhibitors.  Have diabetes or  lupus.  Have poor prenatal care.  What are the signs or symptoms? In most cases, there are no symptoms of oligohydramnios. If you do have symptoms, they may include:  Fluid leaking from the vagina.  Having a uterus that is smaller than normal.  Feeling less movement of your baby in your uterus.  How is this diagnosed? This condition may be diagnosed by measuring the amount of amniotic fluid in your amniotic sac using the amniotic fluid index (AFI). The AFI is a prenatal ultrasound test that uses painless, harmless sound waves to create an image of your uterus and your baby. An AFI prenatal ultrasound test may be done:  To measure the amniotic fluid level.  To check your baby's kidneys and your baby's growth.  To evaluate the placenta.  You may also have other tests to find the cause of oligohydramnios. How is this treated? Treatment for this condition depends on how low your amniotic fluid level is, how far along you are in your pregnancy, and your overall health. Treatment may include:  Having your health care provider monitor your condition more closely than usual. You may have more frequent appointments and more AFI ultrasound measurements.  Increasing the amount of fluid in your body. This may be done by having you drink more fluids, or by giving you fluids through an IV tube that is inserted into one of your veins.  Injecting fluid into your amniotic sac during delivery (amnioinfusion).  Having your baby delivered early, if you are close to your due date.  Follow these instructions at home: Lifestyle  Do not drink alcohol. No safe  level of alcohol consumption during pregnancy has been determined.  Do not use any tobacco products, such as cigarettes, chewing tobacco, and e-cigarettes. If you need help quitting, ask your health care provider.  Do not use any illegal drugs. These can harm your developing baby or cause a miscarriage. General instructions  Take  over-the-counter and prescription medicines only as told by your health care provider.  Follow instructions from your health care provider about physical activity and rest. Your health care provider may recommend that you stay in bed (be on bed rest).  Follow instructions from your health care provider about eating or drinking restrictions.  Eat healthy foods.  Drink enough fluids to keep your urine clear or pale yellow.  Keep all prenatal care appointments with your health care provider. This is important. Contact a health care provider if:  You notice that your baby seems to be moving less than usual. Get help right away if:  You have fluid leaking from your vagina.  You start to have labor pains (contractions). This may feel like a sense of tightening in your lower abdomen.  You have a fever. This information is not intended to replace advice given to you by your health care provider. Make sure you discuss any questions you have with your health care provider. Document Released: 12/19/2010 Document Revised: 02/06/2016 Document Reviewed: 03/16/2015 Elsevier Interactive Patient Education  2018 [place] [employer] Labor induction is when steps are taken to cause a pregnant woman to begin the labor process. Most women go into labor on their own between 37 weeks and 42 weeks of the pregnancy. When this does not happen or when there is a medical need, methods may be used to induce labor. Labor induction causes a pregnant woman's uterus to contract. It also causes the cervix to soften (ripen), open (dilate), and thin out (efface). Usually, labor is not induced before 39 weeks of the pregnancy unless there is a problem with the baby or mother. Before inducing labor, your health care provider will consider a number of factors, including the following:  The medical condition of you and the baby.  How many weeks along you are.  The status of the baby's lung maturity.  The  condition of the cervix.  The position of the baby.  What are the reasons for labor induction? Labor may be induced for the following reasons:  The health of the baby or mother is at risk.  The pregnancy is overdue by 1 week or more.  The water breaks but labor does not start on its own.  The mother has a health condition or serious illness, such as high blood pressure, infection, placental abruption, or diabetes.  The amniotic fluid amounts are low around the baby.  The baby is distressed.  Convenience or wanting the baby to be born on a certain date is not a reason for inducing labor. What methods are used for labor induction? Several methods of labor induction may be used, such as:  Prostaglandin medicine. This medicine causes the cervix to dilate and ripen. The medicine will also start contractions. It can be taken by mouth or by inserting a suppository into the vagina.  Inserting a thin tube (catheter) with a balloon on the end into the vagina to dilate the cervix. Once inserted, the balloon is expanded with water, which causes the cervix to open.  Stripping the membranes. Your health care provider separates amniotic sac tissue from the cervix, causing the cervix to be stretched  and causing the release of a hormone called progesterone. This may cause the uterus to contract. It is often done during an office visit. You will be sent home to wait for the contractions to begin. You will then come in for an induction.  Breaking the water. Your health care provider makes a hole in the amniotic sac using a small instrument. Once the amniotic sac breaks, contractions should begin. This may still take hours to see an effect.  Medicine to trigger or strengthen contractions. This medicine is given through an IV access tube inserted into a vein in your arm.  All of the methods of induction, besides stripping the membranes, will be done in the hospital. Induction is done in the hospital so  that you and the baby can be carefully monitored. How long does it take for labor to be induced? Some inductions can take up to 2-3 days. Depending on the cervix, it usually takes less time. It takes longer when you are induced early in the pregnancy or if this is your first pregnancy. If a mother is still pregnant and the induction has been going on for 2-3 days, either the mother will be sent home or a cesarean delivery will be needed. What are the risks associated with labor induction? Some of the risks of induction include:  Changes in fetal heart rate, such as too high, too low, or erratic.  Fetal distress.  Chance of infection for the mother and baby.  Increased chance of having a cesarean delivery.  Breaking off (abruption) of the placenta from the uterus (rare).  Uterine rupture (very rare).  When induction is needed for medical reasons, the benefits of induction may outweigh the risks. What are some reasons for not inducing labor? Labor induction should not be done if:  It is shown that your baby does not tolerate labor.  You have had previous surgeries on your uterus, such as a myomectomy or the removal of fibroids.  Your placenta lies very low in the uterus and blocks the opening of the cervix (placenta previa).  Your baby is not in a head-down position.  The umbilical cord drops down into the birth canal in front of the baby. This could cut off the baby's blood and oxygen supply.  You have had a previous cesarean delivery.  There are unusual circumstances, such as the baby being extremely premature.  This information is not intended to replace advice given to you by your health care provider. Make sure you discuss any questions you have with your health care provider. Document Released: 01/23/2007 Document Revised: 02/09/2016 Document Reviewed: 04/02/2013 Elsevier Interactive Patient Education  2017 [employer].

## 2017-04-17 NOTE — Anesthesia Preprocedure Evaluation (Deleted)
Anesthesia Evaluation  Patient identified by MRN, date of birth, ID band Patient awake    Reviewed: Allergy & Precautions, H&P , NPO status , Patient's Chart, lab work & pertinent test results, reviewed documented beta blocker date and time   Airway Mallampati: II  TM Distance: >3 FB Neck ROM: full    Dental no notable dental hx. (+) Teeth Intact   Pulmonary neg pulmonary ROS, Current Smoker, former smoker,    Pulmonary exam normal breath sounds clear to auscultation       Cardiovascular Exercise Tolerance: Good negative cardio ROS   Rhythm:regular Rate:Normal     Neuro/Psych PSYCHIATRIC DISORDERS negative neurological ROS  negative psych ROS   GI/Hepatic negative GI ROS, Neg liver ROS, GERD  Medicated,  Endo/Other  negative endocrine ROSdiabetes  Renal/GU      Musculoskeletal   Abdominal   Peds  Hematology negative hematology ROS (+) anemia ,   Anesthesia Other Findings   Reproductive/Obstetrics (+) Pregnancy                             Anesthesia Physical Anesthesia Plan  ASA: II  Anesthesia Plan: Epidural   Post-op Pain Management:    Induction:   PONV Risk Score and Plan:   Airway Management Planned:   Additional Equipment:   Intra-op Plan:   Post-operative Plan:   Informed Consent: I have reviewed the patients History and Physical, chart, labs and discussed the procedure including the risks, benefits and alternatives for the proposed anesthesia with the patient or authorized representative who has indicated his/her understanding and acceptance.     Plan Discussed with:   Anesthesia Plan Comments:         Anesthesia Quick Evaluation

## 2017-04-17 NOTE — Progress Notes (Signed)
Sarah Edwards is a 26 y.o. G3P1011 at 46w0dby LMP admitted for induction of labor due to Low amniotic fluid. and anemia in pregnancy.  Subjective:  Pt sitting in bed, breathing through contractions. Reports increased back pain and pressure. Questions regarding use of pain medications with induction of labor.   Denies difficulty breathing or respiratory distress, chest pain, vaginal bleeding, and leg pain or swelling.   Family members at bedside.   Objective:  Temp:  [97.8 F (36.6 C)-98.4 F (36.9 C)] 98.4 F (36.9 C) (08/01 1929) Pulse Rate:  [83-96] 83 (08/01 1929) Resp:  [16-18] 18 (08/01 1929) BP: (109-122)/(72-77) 121/77 (08/01 1929) Weight:  [173 lb (78.5 kg)-173 lb 12.8 oz (78.8 kg)] 173 lb (78.5 kg) (08/01 1600)  FHT:  FHR: 145 bpm, variability: moderate,  accelerations:  Present,  decelerations:  Absent   UC:   regular, every two (2) to three (3) minutes  SVE:   Dilation: 4 Effacement (%): 60 Station: -3 Exam by:: K. Cevallos RN   Pitocin infusing at 6 mu/minute.   Labs:  Lab Results  Component Value Date   WBC 8.7 04/17/2017   HGB 10.8 (L) 04/17/2017   HCT 33.1 (L) 04/17/2017   MCV 81.0 04/17/2017   PLT 170 04/17/2017    Assessment:  STanis Hensarlingis a 26y.o. G3P1011 at 462w0deing admitted for induction of labor due to oligohydramnios at term, anemia in pregnancy, Rh positive, GBS negative.   FHR Category I  Plan:  Pt may have Stadol or epidural as desired for pain management during induction of labor, see orders.   Discussion of nonpharmacologic pain management techniques including counter pressure, use of birthing ball and peanut ball, and ambulation.   Continue orders as written. Reassess as needed.    JeDiona FantiCNM 04/17/2017, 10:49 PM

## 2017-04-17 NOTE — H&P (Signed)
Obstetric History and Physical  Sarah Edwards is a 26 y.o. [phone] with IUP at 70w0dpresenting for induction of labor due to oligohydramnios at term and anemia of pergnancy.   Patient states she has been having  irregular contractions, none vaginal bleeding, intact membranes, with active fetal movement.    Denies difficulty breathing or respiratory distress, chest pain, abdominal pain, dysuria, and leg pain or swelling.   Prenatal Course  Source of Care: [facility] with onset of care at seven (7) weeks and two (2) days.   Pregnancy complications or risks:  Patient Active Problem List   Diagnosis Date Noted  . Encounter for induction of labor 04/17/2017  . Anemia during pregnancy in third trimester 03/21/2017  . Hypotension 09/24/2016  . Pregnancy 09/23/2016  . Anemia 09/11/2016  . Acid reflux 01/12/2016  . Episodic mood disorder ([place] 01/12/2016  . Crohn disease ([place] 01/07/2015  . Endometriosis 01/07/2015  . Tobacco user 01/07/2015  . Anxiety 01/07/2015   She plans to breastfeed  She desires oral progesterone-only contraceptive for postpartum contraception.   Prenatal labs and studies:  ABO, Rh: B/Positive/-- (01-11-250926)  Antibody: Negative (01-11-20250926)  Rubella: 8.11 (2025-01-110926)  RPR: Non Reactive (2025-01-110926)   HBsAg: Negative (Jan 11, 20250926)   HIV: Non Reactive (January 11, 20250926)  GBS: Negative (07/06 1659)  Varicella: 784 (2025-01-110926)  1 hr Glucola: 122 (05/04 1721)  Genetic screening: Declined  Anatomy [place] normal (03/16)  Past Medical History:  Diagnosis Date  . Abnormal uterine bleeding (AUB)   . Anemia   . Anxiety   . Chronic UTI   . Crohn disease ([place]   . Endometriosis   . IBS (irritable bowel syndrome)   . Painful menstrual periods   . SAB (spontaneous abortion) 01/06/2014    Past Surgical History:  Procedure Laterality Date  . COLONOSCOPY N/A 2010 and 2011   crohns  . ESOPHAGOGASTRODUODENOSCOPY ENDOSCOPY N/A 2010  .  laproscopy  2012    OB History  Gravida Para Term Preterm AB Living  3 1 1  0 1 1  SAB TAB Ectopic Multiple Live Births  1 0 0 0 1    # Outcome Date GA Lbr Len/2nd Weight Sex Delivery Anes PTL Lv  3 Current           2 Term 09/17/09   6 lb 12 oz (3.062 kg) F Vag-Spont   LIV  1 SAB        N       Social History   Social History  . Marital status: Single    Spouse name: N/A  . Number of children: N/A  . Years of education: N/A   Occupational History  .      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 Smoking status: Former Smoker    Packs/day: 0.50    Years: 3.00    Types: Cigarettes    Quit date: 03/18/2015  . Smokeless tobacco: Never Used  . Alcohol use No     Comment: occas - prior to pregnancy.  . Drug use: No  . Sexual activity: Yes    Birth control/ protection: None   Other Topics Concern  . Not on file   Social History Narrative   [place]  Does not routinely exercise.    Family History  Problem Relation Age of Onset  . Cancer Paternal Grandmother  breast   . Ovarian cancer Paternal Grandmother   . Cervical cancer Paternal Grandmother   . Lung cancer Paternal Grandfather   . Leukemia Maternal Grandfather   . Cancer Maternal Grandfather   . Diabetes Mother   . Diabetes Father   . Cancer Paternal Aunt     Prescriptions Prior to Admission  Medication Sig Dispense Refill Last Dose  . hydrOXYzine (ATARAX/VISTARIL) 25 MG tablet Take 1 tablet (25 mg total) by mouth every 6 (six) hours as needed (contractions). (Patient not taking: Reported on 04/11/2017) 30 tablet 0 Not Taking  . ondansetron (ZOFRAN-ODT) 8 MG disintegrating tablet Take 1 tablet (8 mg total) by mouth every 8 (eight) hours as needed for nausea or vomiting. (Patient not taking: Reported on 03/21/2017) 20 tablet 0 Not Taking  . potassium chloride SA (K-DUR,KLOR-CON) 20 MEQ tablet Take 2 tablets (40 mEq total) by mouth daily. (Patient not  taking: Reported on 04/11/2017) 14 tablet 0 Not Taking  . Prenatal Vit-Fe Fumarate-FA (PRENATAL MULTIVITAMIN) TABS tablet Take 1 tablet by mouth daily at 12 noon.   Taking  . promethazine (PHENERGAN) 12.5 MG suppository Place 1 suppository (12.5 mg total) rectally every 6 (six) hours as needed for nausea or vomiting. (Patient not taking: Reported on 03/21/2017) 12 each 0 Not Taking    Allergies  Allergen Reactions  . Azithromycin Swelling  . Amoxicillin Rash  . Penicillins Rash    Childhood reaction  . Sulfa Antibiotics Nausea And Vomiting    Constant vomiting    Review of Systems: Negative except for what is mentioned in HPI.  Physical Exam:  Temp:  [97.8 F (36.6 C)] 97.8 F (36.6 C) (08/01 1600) Pulse Rate:  [87-96] 96 (08/01 1600) Resp:  [16] 16 (08/01 1600) BP: (109-122)/(72-76) 122/76 (08/01 1600) Weight:  [173 lb (78.5 kg)-173 lb 12.8 oz (78.8 kg)] 173 lb (78.5 kg) (08/01 1600)  CONSTITUTIONAL: Well-developed, well-nourished female in no acute distress.   HENT:  Normocephalic, atraumatic, External right and left ear normal. Oropharynx is clear and moist  EYES: Conjunctivae and EOM are normal. Pupils are equal, round, and reactive to light. No scleral icterus.   NECK: Normal range of motion, supple, no masses  SKIN: Skin is warm and dry. No rash noted. Not diaphoretic. No erythema. No pallor.  [place]: Alert and oriented to person, place, and time. Normal reflexes, muscle tone coordination. No cranial nerve deficit noted.  PSYCHIATRIC: Normal mood and affect. Normal behavior. Normal judgment and thought content.  CARDIOVASCULAR: Normal heart rate noted, regular rhythm  RESPIRATORY: Effort and breath sounds normal, no problems with  respiration noted  ABDOMEN: Soft, nontender, nondistended, gravid.  MUSCULOSKELETAL: Normal range of motion. No edema and no tenderness. 2+ distal pulses.  SVE: Deferred, 4.5 cm/60%/-2, vertex by A. Grandville Silos, CNM  Presentation:  cephalic  FHT:  Baseline rate 145 bpm, Variability moderate  Accelerations present   Decelerations none  Contractions: Every two (2) to five (5) minutes   Pertinent Labs/Studies:    Results for orders placed or performed during the hospital encounter of 04/17/17 (from the past 24 hour(s))  CBC     Status: Abnormal   Collection Time: 04/17/17  4:32 PM  Result Value Ref Range   WBC 8.7 3.6 - 11.0 K/uL   RBC 4.08 3.80 - 5.20 MIL/uL   Hemoglobin 10.8 (L) 12.0 - 16.0 g/dL   HCT 33.1 (L) 35.0 - 47.0 %   MCV 81.0 80.0 - 100.0 fL   MCH 26.4 26.0 - 34.0 pg  MCHC 32.6 32.0 - 36.0 g/dL   RDW 30.8 (H) 11.5 - 14.5 %   Platelets 170 150 - 440 K/uL    Assessment :  Sarah Edwards is a 25 y.o. G3P1011 at 46w0dbeing admitted for induction of labor due to oligohydramnios at term, anemia in pregnancy, Rh positive, GBS negative.   FHR Category I  Plan:  Admit, see orders.   Discussed induction/augmentation options including pitocin and AROM as well as associated risks and benefits. Pt decision for IV pitocin titration, see orders.   Delivery plan: Hopeful for vaginal delivery.   Dr CMarcelline Matesnotified of patient arrival and admission to unit.    JDiona Fanti CNM [facility]

## 2017-04-17 NOTE — Progress Notes (Signed)
Sarah Edwards, doing well, U/s today for PD shows oligo ( see below) . Discussed plan of care. TO L&D for induction SVE 4-5cm.   Philip Aspen, CNM  ULTRASOUND REPORT  Location: [facility] Date of Service: 04/17/17  Indications: EFW/AFI for Post Dates Findings:  Singleton intrauterine pregnancy is visualized with FHR at 141 BPM. Biometrics give an (U/S) Gestational age of [redacted] weeks 6 days, and an (U/S) EDD of 05/02/17; this DOES NOT correlate with the clinically established EDD of 04/17/17.  Fetal presentation is vertex, spine posterior (sunny side up).  EFW: 3525 grams ( 7 lbs. 12 oz) 53rd percentile for growth, Williams. Placenta: Maternal left, grade 3 with calcifications seen. AFI: Oligo at 6.8 cm which is below the 5th percentile for [redacted] week gestation.  Anatomic survey of the fetal stomach, bladder and kidneys appear WNL. Gender - Female.  BPP: 6/8:  Fetal breathing: 2, fetal tone: 2, Fetal movement: 2, AFI: 0.    Impression: 1. 37 week 6 day Viable Singleton Intrauterine pregnancy by U/S. 2. (U/S) EDD IS NOT consistent with Clinically established (LMP) EDD of 04/17/17. 3. EFW: 3525 grams ( 7 lbs. 12 oz). 53rd percentile for growth per [name]. 4. AFI: Oligohydramnios at 6.8 cm. This is below the 5th percentile for Gestational age.  Recommendations: 1.Clinical correlation with the patient's History and Physical Exam.

## 2017-04-18 ENCOUNTER — Inpatient Hospital Stay: Payer: 59 | Admitting: Oncology

## 2017-04-18 ENCOUNTER — Inpatient Hospital Stay: Payer: 59

## 2017-04-18 DIAGNOSIS — D649 Anemia, unspecified: Secondary | ICD-10-CM

## 2017-04-18 DIAGNOSIS — O99013 Anemia complicating pregnancy, third trimester: Secondary | ICD-10-CM

## 2017-04-18 DIAGNOSIS — Z3A40 40 weeks gestation of pregnancy: Secondary | ICD-10-CM

## 2017-04-18 LAB — CHLAMYDIA/NGC RT PCR (ARMC ONLY)
Chlamydia Tr: NOT DETECTED
N gonorrhoeae: NOT DETECTED

## 2017-04-18 LAB — CBC
HEMATOCRIT: 22.6 % — AB (ref 35.0–47.0)
HEMOGLOBIN: 7.2 g/dL — AB (ref 12.0–16.0)
MCH: 26.3 pg (ref 26.0–34.0)
MCHC: 32 g/dL (ref 32.0–36.0)
MCV: 82.4 fL (ref 80.0–100.0)
Platelets: 153 10*3/uL (ref 150–440)
RBC: 2.75 MIL/uL — ABNORMAL LOW (ref 3.80–5.20)
RDW: 31.1 % — ABNORMAL HIGH (ref 11.5–14.5)
WBC: 13.5 10*3/uL — ABNORMAL HIGH (ref 3.6–11.0)

## 2017-04-18 LAB — HEMOGLOBIN AND HEMATOCRIT, BLOOD
HCT: 26.4 % — ABNORMAL LOW (ref 35.0–47.0)
HEMATOCRIT: 27.2 % — AB (ref 35.0–47.0)
HEMOGLOBIN: 8.6 g/dL — AB (ref 12.0–16.0)
Hemoglobin: 8.7 g/dL — ABNORMAL LOW (ref 12.0–16.0)

## 2017-04-18 LAB — RPR: RPR Ser Ql: NONREACTIVE

## 2017-04-18 LAB — ABO/RH: ABO/RH(D): B POS

## 2017-04-18 LAB — PREPARE RBC (CROSSMATCH)

## 2017-04-18 MED ORDER — OXYCODONE HCL 5 MG PO TABS: 5.0000 mg | ORAL_TABLET | ORAL | Status: DC | PRN

## 2017-04-18 MED ORDER — HYDROMORPHONE HCL 1 MG/ML IJ SOLN
0.5000 mg | Freq: Once | INTRAMUSCULAR | Status: AC
  Administered 2017-04-18 (×2): 0.5 mg via INTRAVENOUS

## 2017-04-18 MED ORDER — OXYCODONE HCL 5 MG PO TABS: 10.0000 mg | ORAL_TABLET | ORAL | Status: DC | PRN

## 2017-04-18 MED ORDER — PRENATAL MULTIVITAMIN CH
1.0000 | ORAL_TABLET | Freq: Every day | ORAL | Status: DC
  Administered 2017-04-18: 1 via ORAL
  Filled 2017-04-18: qty 1

## 2017-04-18 MED ORDER — MISOPROSTOL 200 MCG PO TABS
800.0000 ug | ORAL_TABLET | Freq: Once | ORAL | Status: AC
  Administered 2017-04-18: 800 ug via RECTAL

## 2017-04-18 MED ORDER — COCONUT OIL OIL: 1.0000 "application " | TOPICAL_OIL | Status: DC | PRN

## 2017-04-18 MED ORDER — CYANOCOBALAMIN 1000 MCG/ML IJ SOLN
1000.0000 ug | Freq: Once | INTRAMUSCULAR | Status: AC
  Administered 2017-04-18: 1000 ug via INTRAMUSCULAR
  Filled 2017-04-18: qty 1

## 2017-04-18 MED ORDER — TETANUS-DIPHTH-ACELL PERTUSSIS 5-2.5-18.5 LF-MCG/0.5 IM SUSP: 0.5000 mL | Freq: Once | INTRAMUSCULAR | Status: DC

## 2017-04-18 MED ORDER — CLINDAMYCIN PHOSPHATE 900 MG/50ML IV SOLN
900.0000 mg | Freq: Once | INTRAVENOUS | Status: AC
  Administered 2017-04-18: 900 mg via INTRAVENOUS
  Filled 2017-04-18: qty 50

## 2017-04-18 MED ORDER — METHYLERGONOVINE MALEATE 0.2 MG PO TABS
0.2000 mg | ORAL_TABLET | ORAL | Status: DC | PRN
  Filled 2017-04-18: qty 1

## 2017-04-18 MED ORDER — SODIUM CHLORIDE 0.9 % IV SOLN
Freq: Once | INTRAVENOUS | Status: AC
  Administered 2017-04-18: 14:00:00 via INTRAVENOUS

## 2017-04-18 MED ORDER — DIPHENHYDRAMINE HCL 25 MG PO CAPS: 25.0000 mg | ORAL_CAPSULE | Freq: Four times a day (QID) | ORAL | Status: DC | PRN

## 2017-04-18 MED ORDER — HYDROMORPHONE HCL 1 MG/ML IJ SOLN
INTRAMUSCULAR | Status: AC
  Administered 2017-04-18: 0.5 mg via INTRAVENOUS
  Filled 2017-04-18: qty 1

## 2017-04-18 MED ORDER — WITCH HAZEL-GLYCERIN EX PADS: 1.0000 "application " | MEDICATED_PAD | CUTANEOUS | Status: DC | PRN

## 2017-04-18 MED ORDER — ZOLPIDEM TARTRATE 5 MG PO TABS: 5.0000 mg | ORAL_TABLET | Freq: Every evening | ORAL | Status: DC | PRN

## 2017-04-18 MED ORDER — SIMETHICONE 80 MG PO CHEW: 80.0000 mg | CHEWABLE_TABLET | ORAL | Status: DC | PRN

## 2017-04-18 MED ORDER — IBUPROFEN 600 MG PO TABS
600.0000 mg | ORAL_TABLET | Freq: Four times a day (QID) | ORAL | Status: DC
  Administered 2017-04-18 – 2017-04-19 (×5): 600 mg via ORAL
  Filled 2017-04-18 (×5): qty 1

## 2017-04-18 MED ORDER — DIBUCAINE 1 % RE OINT: 1.0000 "application " | TOPICAL_OINTMENT | RECTAL | Status: DC | PRN

## 2017-04-18 MED ORDER — BENZOCAINE-MENTHOL 20-0.5 % EX AERO: 1.0000 "application " | INHALATION_SPRAY | CUTANEOUS | Status: DC | PRN

## 2017-04-18 MED ORDER — SENNOSIDES-DOCUSATE SODIUM 8.6-50 MG PO TABS: 2.0000 | ORAL_TABLET | ORAL | Status: DC

## 2017-04-18 MED ORDER — ACETAMINOPHEN 325 MG PO TABS
650.0000 mg | ORAL_TABLET | ORAL | Status: DC | PRN
  Administered 2017-04-18 – 2017-04-19 (×2): 650 mg via ORAL
  Filled 2017-04-18 (×2): qty 2

## 2017-04-18 MED ORDER — ONDANSETRON HCL 4 MG PO TABS: 4.0000 mg | ORAL_TABLET | ORAL | Status: DC | PRN

## 2017-04-18 MED ORDER — METHYLERGONOVINE MALEATE 0.2 MG/ML IJ SOLN: 0.2000 mg | INTRAMUSCULAR | Status: DC | PRN

## 2017-04-18 MED ORDER — ONDANSETRON HCL 4 MG/2ML IJ SOLN: 4.0000 mg | INTRAMUSCULAR | Status: DC | PRN

## 2017-04-18 NOTE — Progress Notes (Signed)
Patient ID: Sarah Edwards, female   DOB: [birth year], 26 y.o.   MRN: [id number]  Post Partum Day # 1/2, s/p SVD  Subjective:  Pt doing well, reports shortness of breath and dizziness with ambulation. Infant sleeping in crib at beside. FOB present in room.   Denies difficulty breathing at rest or respiratory distress, chest pain, abdominal pain, excessive bleeding, and leg pain or swelling.   Objective: Temp:  [97.8 F (36.6 C)-98.5 F (36.9 C)] 98 F (36.7 C) (08/02 0756) Pulse Rate:  [83-161] 84 (08/02 0756) Resp:  [16-20] 17 (08/02 0756) BP: (75-125)/(36-87) 106/57 (08/02 0756) SpO2:  [99 %-100 %] 99 % (08/02 0756) Weight:  [173 lb (78.5 kg)-173 lb 12.8 oz (78.8 kg)] 173 lb (78.5 kg) (08/01 1600)  Physical Exam:   General: alert and cooperative   Lungs: clear to auscultation bilaterally  Breasts: normal appearance, no masses or tenderness  Heart: regular rate and rhythm, S1, S2 normal, no murmur, click, rub or gallop  Abdomen: soft, non-tender; bowel sounds normal; no masses,  no organomegaly  Pelvis: Lochia: appropriate, Uterine Fundus: firm  Extremities: DVT Evaluation: No evidence of DVT seen on physical exam. Negative Homan's sign.   Recent Labs  04/17/17 1632 04/18/17 0138  HGB 10.8* 8.6*  HCT 33.1* 27.2*    Assessment/Plan:  Repeat H/H at noon. If hemoglobin less than 8.0, then will transfuse one (1) unit.  Offer PO iron supplementation, pt declines and would like to follow up with Hem/Onc outpatient.  Reviewed red flag symptoms and when to notify.  Continue orders as written. Reassess as needed.    LOS: 1 day   Diona Fanti, CNM [facility] 04/18/2017 12:01 PM

## 2017-04-18 NOTE — Progress Notes (Signed)
Post Partum Day # 2/3, s/p SVD  Subjective:  Pt feeling "better after unit of blood", declines additional unit of blood at this time.  Ambulating in room without difficulty. Infant and FOB at bedside.   Denies difficulty breathing or respiratory distress, chest pain, abdominal pain, excessive vaginal bleeding, and leg pain or swelling.   Objective:  Temp:  [98 F (36.7 C)-98.7 F (37.1 C)] 98 F (36.7 C) (08/02 1735) Pulse Rate:  [83-161] 93 (08/02 1735) Resp:  [16-20] 18 (08/02 1735) BP: (75-125)/(36-87) 113/63 (08/02 1735) SpO2:  [99 %-100 %] 100 % (08/02 1735)  Physical Exam:   General: alert and cooperative   Lungs: clear to auscultation bilaterally  Breasts: no complaints, exam deferred  Heart: regular rate and rhythm, S1, S2 normal, no murmur, click, rub or gallop  Abdomen: soft, non-tender; bowel sounds normal; no masses,  no organomegaly  Pelvis: Lochia: appropriate, Uterine Fundus: firm  Extremities: DVT Evaluation: No evidence of DVT seen on physical exam. Negative Homan's sign.   Recent Labs  04/18/17 0138 04/18/17 1217  HGB 8.6* 7.2*  HCT 27.2* 22.6*    Assessment/Plan: Plan for discharge tomorrow   LOS: 1 day   Diona Fanti, CNM [facility] 04/18/2017 6:08 PM

## 2017-04-19 LAB — HEMOGLOBIN AND HEMATOCRIT, BLOOD
HCT: 24.2 % — ABNORMAL LOW (ref 35.0–47.0)
HEMOGLOBIN: 8 g/dL — AB (ref 12.0–16.0)

## 2017-04-19 MED ORDER — IBUPROFEN 600 MG PO TABS: 600.0000 mg | ORAL_TABLET | Freq: Four times a day (QID) | ORAL | 0 refills | Status: DC

## 2017-04-19 NOTE — Progress Notes (Signed)
Discharge instructions reviewed with patient.  All questions answered.  Follow up appointment scheduled.

## 2017-04-19 NOTE — Discharge Summary (Signed)
Obstetric Discharge Summary  Patient ID: Sarah Edwards MRN: [id number] DOB/AGE: [birth year] 26 y.o.   Date of Admission: 04/17/2017 Sarah Edwards, CNM Sarah Nims, MD)  Date of Discharge: 04/19/2017 Sarah Edwards, CNM Sarah Pean, MD)  Admitting Diagnosis: Induction of labor at [redacted]w[redacted]d Secondary Diagnosis: Anemia in pregnancy and Low amniotic fluid  Mode of Delivery: normal spontaneous vaginal delivery     Discharge Diagnosis: Post partum hemorrhage   Intrapartum Procedures: pitocin augmentation and manual removal of placenta   Post partum procedures: blood transfusion  Complications: none   BAlto [place] Edwards is a [id number] had a SVD on 04/18/2017;  for further details of this birth, please refer to the delivey note.  Pospartum course was complicated by immediate postpartum hemorrhage requiring manual remoal of placenta. A unit of blood was given for symptomatic anemia.  By time of discharge on PPD#2, her pain was controlled on oral pain medications; she had appropriate lochia and was ambulating, voiding without difficulty and tolerating regular diet.  She was deemed stable for discharge to home with outpatient hematology follow up for iron infusion.  Labs:  CBC Latest Ref Rng & Units 04/19/2017 04/18/2017 04/18/2017  WBC 3.6 - 11.0 K/uL - - 13.5(H)  Hemoglobin 12.0 - 16.0 g/dL 8.0(L) 8.7(L) 7.2(L)  Hematocrit 35.0 - 47.0 % 24.2(L) 26.4(L) 22.6(L)  Platelets 150 - 440 K/uL - - 153   B POS  Physical exam:   BP (!) 108/52   Pulse 90   Temp (!) 97.5 F (36.4 C) (Oral)   Resp 18   Ht 5' 5"  (1.651 m)   Wt 173 lb (78.5 kg)   LMP 07/11/2016 (Approximate)   SpO2 99%   Breastfeeding? Unknown   BMI 28.79 kg/m   General: alert and no distress  Breast: no complaints, deferred  Lochia: appropriate  Abdomen: soft, NT  Uterine Fundus: firm  Perineum:  no significant drainage, no dehiscence, no  significant erythema  Extremities: No evidence of DVT  seen on physical exam. No lower extremity edema.  Discharge Instructions: Per After Visit Summary.  Activity: Advance as tolerated. Pelvic rest for 6 weeks.  Also refer to After Visit Summary  Diet: Regular  Medications: Allergies as of 04/19/2017      Reactions   Azithromycin Swelling   Amoxicillin Rash   Penicillins Rash   Childhood reaction   Sulfa Antibiotics Nausea And Vomiting   Constant vomiting      Medication List    STOP taking these medications   hydrOXYzine 25 MG tablet Commonly known as:  ATARAX/VISTARIL   ondansetron 8 MG disintegrating tablet Commonly known as:  ZOFRAN-ODT   potassium chloride SA 20 MEQ tablet Commonly known as:  K-DUR,KLOR-CON   promethazine 12.5 MG suppository Commonly known as:  PHENERGAN     TAKE these medications   ibuprofen 600 MG tablet Commonly known as:  ADVIL,MOTRIN Take 1 tablet (600 mg total) by mouth every 6 (six) hours.   prenatal multivitamin Tabs tablet Take 1 tablet by mouth daily at 12 noon.      Outpatient follow up:  Follow-up Information    LDiona Edwards CNM. Schedule an appointment as soon as possible for a visit in 6 week(s).   Specialties:  Certified Nurse Midwife, Obstetrics and Gynecology, Radiology Why:  Call to schedule six (6) week postpartum appointment with MDani Edwards CNM Contact information: [address] [phone]  Postpartum contraception: oral progesterone-only contraceptive  Discharged Condition: stable  Discharged to: home   Newborn Data:  Disposition:home with mother  Apgars: APGAR (1 MIN): 8    APGAR (5 MINS): 9     Baby Feeding: Breast    Sarah Edwards, CNM

## 2017-04-21 LAB — TYPE AND SCREEN
ABO/RH(D): B POS
Antibody Screen: NEGATIVE
UNIT DIVISION: 0
UNIT DIVISION: 0

## 2017-04-21 LAB — BPAM RBC
BLOOD PRODUCT EXPIRATION DATE: 201808302359
Blood Product Expiration Date: 201809012359
ISSUE DATE / TIME: 201808021419
UNIT TYPE AND RH: 1700
Unit Type and Rh: 7300

## 2017-04-22 ENCOUNTER — Other Ambulatory Visit: Payer: Self-pay | Admitting: Oncology

## 2017-04-24 ENCOUNTER — Inpatient Hospital Stay: Payer: 59

## 2017-04-24 ENCOUNTER — Telehealth: Payer: Self-pay | Admitting: Certified Nurse Midwife

## 2017-04-24 NOTE — Telephone Encounter (Signed)
Patient is having severe headaches and also has some red spots around her breast   Please call

## 2017-04-25 NOTE — Telephone Encounter (Signed)
Left message to contact office

## 2017-04-26 ENCOUNTER — Telehealth: Payer: Self-pay | Admitting: Certified Nurse Midwife

## 2017-04-26 NOTE — Telephone Encounter (Signed)
The patient lvm stating that she was returning Debbie's call in regards to her headaches. I called the patient and lvm stating that Stem sent her a MyChart Message in regards to her questions and concerns, and if she has any other issues, she can call the office for further assistance. Thank you.

## 2017-04-29 ENCOUNTER — Encounter: Payer: Self-pay | Admitting: Certified Nurse Midwife

## 2017-04-29 ENCOUNTER — Ambulatory Visit (INDEPENDENT_AMBULATORY_CARE_PROVIDER_SITE_OTHER): Payer: 59 | Admitting: Certified Nurse Midwife

## 2017-04-29 VITALS — BP 115/80 | HR 105 | Temp 98.9°F | Wt 152.1 lb

## 2017-04-29 DIAGNOSIS — R109 Unspecified abdominal pain: Secondary | ICD-10-CM

## 2017-04-29 DIAGNOSIS — O9089 Other complications of the puerperium, not elsewhere classified: Secondary | ICD-10-CM

## 2017-04-29 DIAGNOSIS — R51 Headache: Secondary | ICD-10-CM

## 2017-04-29 DIAGNOSIS — R519 Headache, unspecified: Secondary | ICD-10-CM

## 2017-04-29 MED ORDER — NAPROXEN 500 MG PO TABS: 500.0000 mg | ORAL_TABLET | Freq: Two times a day (BID) | ORAL | 2 refills | Status: DC

## 2017-04-29 MED ORDER — METOCLOPRAMIDE HCL 10 MG PO TABS: 10.0000 mg | ORAL_TABLET | Freq: Four times a day (QID) | ORAL | 2 refills | Status: DC | PRN

## 2017-04-29 MED ORDER — MAGNESIUM OXIDE 400 (241.3 MG) MG PO TABS
400.0000 mg | ORAL_TABLET | Freq: Two times a day (BID) | ORAL | 2 refills | Status: DC
Start: 2017-04-29 — End: 2017-06-14

## 2017-04-29 NOTE — Progress Notes (Signed)
Pt states she has a headache since delivery 04/18/17. Also c/o stomach is sore when she pushes on it and is cramping with flow getting heavier.

## 2017-04-30 NOTE — Telephone Encounter (Signed)
Left message to contact office, wanted to know how she was doing. No call back from pt  Was seen for headaches yesterday.

## 2017-05-01 ENCOUNTER — Inpatient Hospital Stay: Payer: 59 | Attending: Oncology

## 2017-05-01 VITALS — BP 124/72 | HR 99 | Temp 97.0°F | Resp 20

## 2017-05-01 DIAGNOSIS — D509 Iron deficiency anemia, unspecified: Secondary | ICD-10-CM | POA: Diagnosis not present

## 2017-05-01 DIAGNOSIS — Z79899 Other long term (current) drug therapy: Secondary | ICD-10-CM | POA: Diagnosis not present

## 2017-05-01 DIAGNOSIS — Z87891 Personal history of nicotine dependence: Secondary | ICD-10-CM | POA: Diagnosis not present

## 2017-05-01 DIAGNOSIS — D519 Vitamin B12 deficiency anemia, unspecified: Secondary | ICD-10-CM | POA: Diagnosis not present

## 2017-05-01 DIAGNOSIS — K5090 Crohn's disease, unspecified, without complications: Secondary | ICD-10-CM | POA: Diagnosis not present

## 2017-05-01 DIAGNOSIS — O99013 Anemia complicating pregnancy, third trimester: Secondary | ICD-10-CM

## 2017-05-01 DIAGNOSIS — F419 Anxiety disorder, unspecified: Secondary | ICD-10-CM | POA: Diagnosis not present

## 2017-05-01 MED ORDER — SODIUM CHLORIDE 0.9 % IV SOLN
Freq: Once | INTRAVENOUS | Status: AC
  Administered 2017-05-01: 14:00:00 via INTRAVENOUS
  Filled 2017-05-01: qty 1000

## 2017-05-01 MED ORDER — IRON SUCROSE 20 MG/ML IV SOLN
200.0000 mg | Freq: Once | INTRAVENOUS | Status: AC
  Administered 2017-05-01: 200 mg via INTRAVENOUS
  Filled 2017-05-01: qty 10

## 2017-05-01 MED ORDER — SODIUM CHLORIDE 0.9 % IV SOLN: 200.0000 mg | Freq: Once | INTRAVENOUS | Status: DC

## 2017-05-05 NOTE — Progress Notes (Signed)
GYN ENCOUNTER NOTE  Subjective:       Sarah Edwards is a 26 y.o. [phone] female here for evaluation of intermittent headache and increased abdominal cramping since recent vaginal delivery on 04/18/2017.   Headaches that radiate from her temples to the "back of her eyes". Resolve with rest. Increased abdominal cramping with minimal relief with Motrin.   Denies nausea and visual changes.   Denies difficulty breathing or respiratory distress, fever, chest pain, excessive vaginal bleeding, dysuria, and leg pain or swelling.    Gynecologic History  Patient's last menstrual period was 07/11/2016 (approximate).   Contraception: none   Last Pap: 08/23/2015. Results were: Normal  Obstetric History  OB History  Gravida Para Term Preterm AB Living  3 2 2  0 1 2  SAB TAB Ectopic Multiple Live Births  1 0 0 0 2    # Outcome Date GA Lbr Len/2nd Weight Sex Delivery Anes PTL Lv  3 Term 04/18/17 [redacted]w[redacted]d/ 00:14 7 lb 14.3 oz (3.58 kg) F Vag-Spont Local  LIV  2 Term 09/17/09   6 lb 12 oz (3.062 kg) F Vag-Spont   LIV  1 SAB        N       Past Medical History:  Diagnosis Date  . Abnormal uterine bleeding (AUB)   . Anemia   . Anxiety   . Chronic UTI   . Crohn disease ([place]   . Endometriosis   . IBS (irritable bowel syndrome)   . Painful menstrual periods   . SAB (spontaneous abortion) 01/06/2014    Past Surgical History:  Procedure Laterality Date  . COLONOSCOPY N/A 2010 and 2011   crohns  . ESOPHAGOGASTRODUODENOSCOPY ENDOSCOPY N/A 2010  . laproscopy  2012    Current Outpatient Prescriptions on File Prior to Visit  Medication Sig Dispense Refill  . ibuprofen (ADVIL,MOTRIN) 600 MG tablet Take 1 tablet (600 mg total) by mouth every 6 (six) hours. 30 tablet 0  . Prenatal Vit-Fe Fumarate-FA (PRENATAL MULTIVITAMIN) TABS tablet Take 1 tablet by mouth daily at 12 noon.     No current facility-administered medications on file prior to visit.     Allergies  Allergen Reactions  .  Azithromycin Swelling  . Amoxicillin Rash  . Penicillins Rash    Childhood reaction  . Sulfa Antibiotics Nausea And Vomiting    Constant vomiting    Social History   Social History  . Marital status: Single    Spouse name: N/A  . Number of children: N/A  . Years of education: N/A   Occupational History  .      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 Smoking status: Former Smoker    Packs/day: 0.50    Years: 3.00    Types: Cigarettes    Quit date: 03/18/2015  . Smokeless tobacco: Never Used  . Alcohol use No     Comment: occas - prior to pregnancy.  . Drug use: No  . Sexual activity: Yes    Birth control/ protection: None   Other Topics Concern  . Not on file   Social History Narrative   [place]  Does not routinely exercise.    Family History  Problem Relation Age of Onset  . Cancer Paternal Grandmother        breast   . Ovarian cancer Paternal Grandmother   . Cervical cancer Paternal Grandmother   . Lung cancer Paternal Grandfather   .  Leukemia Maternal Grandfather   . Cancer Maternal Grandfather   . Diabetes Mother   . Diabetes Father   . Cancer Paternal Aunt     The following portions of the patient's history were reviewed and updated as appropriate: allergies, current medications, past family history, past medical history, past social history, past surgical history and problem list.  Review of Systems  Review of Systems - Negative except as noted above.  History obtained from the patient.   Objective:   BP 115/80   Pulse (!) 105   Wt 152 lb 2 oz (69 kg)   LMP 07/11/2016 (Approximate)   Breastfeeding? Yes   BMI 25.31 kg/m    CONSTITUTIONAL: Well-developed, well-nourished female in no acute distress.   HENT:  Normocephalic, atraumatic.   NECK: Normal range of motion, supple, no masses.    SKIN: Skin is warm and dry. No rash noted. Not diaphoretic. No erythema. No pallor.  [place]:  Alert and oriented to person, place, and time.   PSYCHIATRIC: Normal mood and affect. Normal behavior. Normal judgment and thought content.  CARDIOVASCULAR:Not Examined  RESPIRATORY: Not Examined  BREASTS: Not Examined  ABDOMEN: Soft, non distended; Non tender.  No Organomegaly.  PELVIC: Not examined.  MUSCULOSKELETAL: Normal range of motion. No tenderness.  No cyanosis, clubbing, or edema.  Assessment:   1. Postpartum headache   2. Abdominal cramping  Plan:   Rx: Naproxen, Reglan, and Magnesium oxide, see orders.   Encouraged follow up with Hematology for iron infusion.   Neurology consult, see orders.   Reviewed red flag symptoms and when to call.    Diona Fanti, CNM

## 2017-05-08 ENCOUNTER — Inpatient Hospital Stay: Payer: 59

## 2017-05-15 ENCOUNTER — Ambulatory Visit: Payer: 59

## 2017-05-16 ENCOUNTER — Inpatient Hospital Stay: Payer: 59 | Admitting: Oncology

## 2017-05-16 ENCOUNTER — Inpatient Hospital Stay: Payer: 59

## 2017-05-16 ENCOUNTER — Inpatient Hospital Stay (HOSPITAL_BASED_OUTPATIENT_CLINIC_OR_DEPARTMENT_OTHER): Payer: 59 | Admitting: Oncology

## 2017-05-16 ENCOUNTER — Telehealth: Payer: Self-pay | Admitting: *Deleted

## 2017-05-16 ENCOUNTER — Encounter: Payer: Self-pay | Admitting: Oncology

## 2017-05-16 VITALS — BP 105/68 | HR 82 | Temp 96.7°F | Resp 18 | Wt 153.7 lb

## 2017-05-16 DIAGNOSIS — F419 Anxiety disorder, unspecified: Secondary | ICD-10-CM | POA: Diagnosis not present

## 2017-05-16 DIAGNOSIS — D509 Iron deficiency anemia, unspecified: Secondary | ICD-10-CM | POA: Diagnosis not present

## 2017-05-16 DIAGNOSIS — O99013 Anemia complicating pregnancy, third trimester: Secondary | ICD-10-CM

## 2017-05-16 DIAGNOSIS — Z79899 Other long term (current) drug therapy: Secondary | ICD-10-CM | POA: Diagnosis not present

## 2017-05-16 DIAGNOSIS — E538 Deficiency of other specified B group vitamins: Secondary | ICD-10-CM

## 2017-05-16 DIAGNOSIS — D519 Vitamin B12 deficiency anemia, unspecified: Secondary | ICD-10-CM

## 2017-05-16 DIAGNOSIS — Z87891 Personal history of nicotine dependence: Secondary | ICD-10-CM | POA: Diagnosis not present

## 2017-05-16 DIAGNOSIS — K5090 Crohn's disease, unspecified, without complications: Secondary | ICD-10-CM | POA: Diagnosis not present

## 2017-05-16 LAB — CBC WITH DIFFERENTIAL/PLATELET
BASOS ABS: 0 10*3/uL (ref 0–0.1)
BASOS PCT: 1 %
EOS PCT: 2 %
Eosinophils Absolute: 0.1 10*3/uL (ref 0–0.7)
HCT: 34.9 % — ABNORMAL LOW (ref 35.0–47.0)
Hemoglobin: 11.7 g/dL — ABNORMAL LOW (ref 12.0–16.0)
Lymphocytes Relative: 28 %
Lymphs Abs: 1.4 10*3/uL (ref 1.0–3.6)
MCH: 28.1 pg (ref 26.0–34.0)
MCHC: 33.5 g/dL (ref 32.0–36.0)
MCV: 83.9 fL (ref 80.0–100.0)
MONO ABS: 0.4 10*3/uL (ref 0.2–0.9)
Monocytes Relative: 8 %
NEUTROS ABS: 3.1 10*3/uL (ref 1.4–6.5)
Neutrophils Relative %: 61 %
PLATELETS: 259 10*3/uL (ref 150–440)
RBC: 4.16 MIL/uL (ref 3.80–5.20)
RDW: 21.8 % — AB (ref 11.5–14.5)
WBC: 5 10*3/uL (ref 3.6–11.0)

## 2017-05-16 LAB — FERRITIN: Ferritin: 52 ng/mL (ref 11–307)

## 2017-05-16 LAB — IRON AND TIBC
Iron: 58 ug/dL (ref 28–170)
Saturation Ratios: 15 % (ref 10.4–31.8)
TIBC: 382 ug/dL (ref 250–450)
UIBC: 324 ug/dL

## 2017-05-16 NOTE — Telephone Encounter (Signed)
-----   Message from Sindy Guadeloupe, MD sent at 05/16/2017  3:40 PM EDT ----- Iron levels are normal. No need for IV iron

## 2017-05-16 NOTE — Progress Notes (Signed)
Hematology/Oncology Consult note [facility]  [phone] [phone]  Patient Care Team: Glean Hess, MD as PCP - General (Family Medicine)   Name of the patient: Sarah Edwards  [id number]  [birth year]   Date of visit: 05/16/17  Diagnosis- B12 and iron deficiency anemia  Chief complaint/ Reason for visit- routine f/u  Heme/Onc history: patient is a 26 year old female been referred to [place] for anemia of pregnancy. CBC from 09/24/2016 showed white count of 7.1, H&H of 10.2/30.6 with an MCV of 80.6 and a platelet count of 196. Her H&H over the last 6 months has been between 10-11. On 03/05/2017 her H&H was 8.8/29.3. B12 levels were low normal at 253 and folate level was normal. This is her second pregnancy and her first pregnancy was about 5 years ago. She did not have any complications during that pregnancy. Within normal vaginal delivery although she says that she was anemic at that time which did get better on its own. Also reports that she had anemia outside of her pregnancy is well. She has been trying to eat iron rich foods as well as take oral iron and liquid iron but she has not been able to tolerate iron supplements due to nausea. Denies any blood loss in her urine or stool. No family history of anemia.  CBC on 03/21/2017 showed white count of 6.4, H&H of 8.7/26.8 with an MCV of 70.5 and a platelet count of 181. CMP showed low potassium of 2.9. Ferritin was low at 14. Iron studies showed low iron saturation of 3% and low serum iron of 20. ESR was elevated at 70. TSH was normal. Multiple myeloma panel did not reveal any monoclonal protein. Haptoglobin was normal. Reticulocyte count was low normal at 1.7%.  She is currently 4 weeks postpartum. She is breast feeding. Pregnancy was complicated by retained placenta that was removed without surgical intervention but she did have signifcant blood loss at that time requiring blood  transfusion   Interval history- doing well. Still reports post partum headaches and will be seeing neurology soon for the same. Still feeels mildly fatigued. Denies other complaints  ECOG PS- 0 Pain scale- 0  Review of systems- Review of Systems  Constitutional: Positive for malaise/fatigue. Negative for chills, fever and weight loss.  HENT: Negative for congestion, ear discharge and nosebleeds.   Eyes: Negative for blurred vision.  Respiratory: Negative for cough, hemoptysis, sputum production, shortness of breath and wheezing.   Cardiovascular: Negative for chest pain, palpitations, orthopnea and claudication.  Gastrointestinal: Negative for abdominal pain, blood in stool, constipation, diarrhea, heartburn, melena, nausea and vomiting.  Genitourinary: Negative for dysuria, flank pain, frequency, hematuria and urgency.  Musculoskeletal: Negative for back pain, joint pain and myalgias.  Skin: Negative for rash.  Neurological: Positive for headaches. Negative for dizziness, tingling, focal weakness, seizures and weakness.  Endo/Heme/Allergies: Does not bruise/bleed easily.  Psychiatric/Behavioral: Negative for depression and suicidal ideas. The patient does not have insomnia.       Allergies  Allergen Reactions  . Azithromycin Swelling  . Amoxicillin Rash  . Penicillins Rash    Childhood reaction  . Sulfa Antibiotics Nausea And Vomiting    Constant vomiting     Past Medical History:  Diagnosis Date  . Abnormal uterine bleeding (AUB)   . Anemia   . Anxiety   . Chronic UTI   . Crohn disease ([place])   . Endometriosis   . IBS (irritable bowel syndrome)   . Painful  menstrual periods   . SAB (spontaneous abortion) 01/06/2014     Past Surgical History:  Procedure Laterality Date  . COLONOSCOPY N/A 2010 and 2011   crohns  . ESOPHAGOGASTRODUODENOSCOPY ENDOSCOPY N/A 2010  . laproscopy  2012    Social History   Social History  . Marital status: Single    Spouse name:  N/A  . Number of children: N/A  . Years of education: N/A   Occupational History  .      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 Smoking status: Former Smoker    Packs/day: 0.50    Years: 3.00    Types: Cigarettes    Quit date: 03/18/2015  . Smokeless tobacco: Never Used  . Alcohol use No     Comment: occas - prior to pregnancy.  . Drug use: No  . Sexual activity: Yes    Birth control/ protection: None   Other Topics Concern  . Not on file   Social History Narrative   Lives in [place] with husband and dtr.  Does not routinely exercise.    Family History  Problem Relation Age of Onset  . Cancer Paternal Grandmother        breast   . Ovarian cancer Paternal Grandmother   . Cervical cancer Paternal Grandmother   . Lung cancer Paternal Grandfather   . Leukemia Maternal Grandfather   . Cancer Maternal Grandfather   . Diabetes Mother   . Diabetes Father   . Cancer Paternal Aunt      Current Outpatient Prescriptions:  .  naproxen (NAPROSYN) 500 MG tablet, Take 1 tablet (500 mg total) by mouth 2 (two) times daily with a meal. As needed for pain, Disp: 60 tablet, Rfl: 2 .  ibuprofen (ADVIL,MOTRIN) 600 MG tablet, Take 1 tablet (600 mg total) by mouth every 6 (six) hours. (Patient not taking: Reported on 05/16/2017), Disp: 30 tablet, Rfl: 0 .  magnesium oxide (MAG-OX) 400 (241.3 Mg) MG tablet, Take 1 tablet (400 mg total) by mouth 2 (two) times daily. (Patient not taking: Reported on 05/16/2017), Disp: 60 tablet, Rfl: 2 .  metoCLOPramide (REGLAN) 10 MG tablet, Take 1 tablet (10 mg total) by mouth 4 (four) times daily as needed for nausea or vomiting. (Patient not taking: Reported on 05/16/2017), Disp: 30 tablet, Rfl: 2 .  Prenatal Vit-Fe Fumarate-FA (PRENATAL MULTIVITAMIN) TABS tablet, Take 1 tablet by mouth daily at 12 noon., Disp: , Rfl:   Physical exam:  Vitals:   05/16/17 1419  BP: 105/68  Pulse: 82  Resp: 18  Temp: (!) 96.7 F (35.9 C)  TempSrc:  Tympanic  Weight: 153 lb 11.2 oz (69.7 kg)   Physical Exam  Constitutional: She is oriented to person, place, and time and well-developed, well-nourished, and in no distress.  HENT:  Head: Normocephalic and atraumatic.  Eyes: Pupils are equal, round, and reactive to light. EOM are normal.  Neck: Normal range of motion.  Cardiovascular: Normal rate, regular rhythm and normal heart sounds.   Pulmonary/Chest: Effort normal and breath sounds normal.  Abdominal: Soft. Bowel sounds are normal.  Neurological: She is alert and oriented to person, place, and time.  Skin: Skin is warm and dry.     CMP Latest Ref Rng & Units 03/28/2017  Glucose 65 - 99 mg/dL 110(H)  BUN 6 - 20 mg/dL 10  Creatinine 0.44 - 1.00 mg/dL 0.52  Sodium 135 - 145 mmol/L 135  Potassium 3.5 - 5.1 mmol/L 3.8  Chloride 101 - 111 mmol/L 104  CO2 22 - 32 mmol/L 23  Calcium 8.9 - 10.3 mg/dL 8.8(L)  Total Protein 6.5 - 8.1 g/dL -  Total Bilirubin 0.3 - 1.2 mg/dL -  Alkaline Phos 38 - 126 U/L -  AST 15 - 41 U/L -  ALT 14 - 54 U/L -   CBC Latest Ref Rng & Units 05/16/2017  WBC 3.6 - 11.0 K/uL 5.0  Hemoglobin 12.0 - 16.0 g/dL 11.7(L)  Hematocrit 35.0 - 47.0 % 34.9(L)  Platelets 150 - 440 K/uL 259    No images are attached to the encounter.  US Ob Follow Up  Result Date: 04/18/2017 ULTRASOUND REPORT Location: [facility] Date of Service: 04/17/17 Indications: EFW/AFI for Post Dates Findings: Singleton intrauterine pregnancy is visualized with FHR at 141 BPM. Biometrics give an (U/S) Gestational age of [redacted] weeks 6 days, and an (U/S) EDD of 05/02/17; this DOES NOT correlate with the clinically established EDD of 04/17/17. Fetal presentation is vertex, spine posterior (sunny side up). EFW: 3525 grams ( 7 lbs. 12 oz) 53rd percentile for growth, Williams. Placenta: Maternal left, grade 3 with calcifications seen. AFI: Oligo at 6.8 cm which is below the 5th percentile for [redacted] week gestation. Anatomic survey of the fetal  stomach, bladder and kidneys appear WNL. Gender - Female. BPP: 6/8:  Fetal breathing: 2, fetal tone: 2, Fetal movement: 2, AFI: 0. Impression: 1. 37 week 6 day Viable Singleton Intrauterine pregnancy by U/S. 2. (U/S) EDD IS NOT consistent with Clinically established (LMP) EDD of 04/17/17. 3. EFW: 3525 grams ( 7 lbs. 12 oz). 53rd percentile for growth per [name]. 4. AFI: Oligohydramnios at 6.8 cm. This is below the 5th percentile for Gestational age. Recommendations: 1.Clinical correlation with the patient's History and Physical Exam. Macarthur Critchley, RDMS, RVT Scan reviewed and agree with findings. Melody Remsen, CNM    Assessment and plan- Patient is a 26 y.o. female with iron and B12 deficiency anemia   H/H significantly improved post partum. Last b12 dose was on 04/18/17. I have advised ehr to take PO B12 1000 mcg daily. Repeat cbc, iron studies and b12 in 6 weeks and 12 weeks and I will see ehr back in 12 weeks. I will decide about further doses of venofer based on todays levels. No infusions today   Visit Diagnosis 1. Iron deficiency anemia, unspecified iron deficiency anemia type   2. B12 deficiency      Dr. Randa Evens, MD, MPH [facility] Pager- [phone] 05/16/2017 3:07 PM

## 2017-05-16 NOTE — Progress Notes (Signed)
Here for follow up feeling sluggish w frequent headaches-temporal lobe and peri orbital  Per pt -relieved by Naproxen prn per pt

## 2017-05-16 NOTE — Telephone Encounter (Signed)
Called pt and let her know that her ferritin and iron levels were good and she does not need to have IV iron. She still rec:  That she start on vit b 12 orally. She will pick it up at [place] and start it

## 2017-05-30 ENCOUNTER — Encounter: Payer: 59 | Admitting: Certified Nurse Midwife

## 2017-06-14 ENCOUNTER — Ambulatory Visit (INDEPENDENT_AMBULATORY_CARE_PROVIDER_SITE_OTHER): Payer: 59 | Admitting: Certified Nurse Midwife

## 2017-06-14 NOTE — Patient Instructions (Signed)
Intrauterine Device Insertion An intrauterine device (IUD) is a medical device that gets inserted into the uterus to prevent pregnancy. It is a small, T-shaped device that has one or two nylon strings hanging down from it. The strings hang out of the lower part of the uterus (cervix) to allow for future IUD removal. There are two types of IUDs available:  Copper IUD. This type of IUD has copper wire wrapped around it. Copper makes the uterus and fallopian tubes produce a fluid that kills sperm. A copper IUD may last up to 10 years.  Hormone IUD. This type of IUD is made of plastic and contains the hormone progestin (synthetic progesterone). The hormone thickens mucus in the cervix and prevents sperm from entering the uterus. It also thins the uterine lining to prevent implantation of a fertilized egg. The hormone can weaken or kill the sperm that get into the uterus. A hormone IUD may last 3-5 years.  Tell a health care provider about:  Any allergies you have.  All medicines you are taking, including vitamins, herbs, eye drops, creams, and over-the-counter medicines.  Any problems you or family members have had with anesthetic medicines.  Any blood disorders you have.  Any surgeries you have had.  Any medical conditions you have, including any STIs (sexually transmitted infections) you may have.  Whether you are pregnant or may be pregnant. What are the risks? Generally, this is a safe procedure. However, problems may occur, including:  Infection.  Bleeding.  Allergic reactions to medicines.  Accidental puncture (perforation) of the uterus, or damage to other structures or organs.  Accidental placement of the IUD either in the muscle layer of the uterus (myometrium) or outside the uterus.  The IUD falling out of the uterus (expulsion). This is more common among women who have recently had a child.  Pregnancy that happens in the fallopian tube (ectopic pregnancy).  Infection of  the uterus and fallopian tubes (pelvic inflammatory disease).  What happens before the procedure?  Schedule the IUD insertion for when you will have your menstrual period or right after, to make sure you are not pregnant. Placement of the IUD is better tolerated shortly after a menstrual cycle.  Follow instructions from your health care provider about eating or drinking restrictions.  Ask your health care provider about changing or stopping your regular medicines. This is especially important if you are taking diabetes medicines or blood thinners.  You may get a pain reliever to take before the procedure.  You may have tests for: ? Pregnancy. A pregnancy test involves having a urine sample taken. ? STIs. Placing an IUD in someone who has an STI can make the infection worse. ? Cervical cancer. You may have a Pap test to check for this type of cancer. This means collecting cells from your cervix to be examined under a microscope.  You may have a physical exam to determine the size and position of your uterus. The procedure may vary among health care providers and hospitals. What happens during the procedure?  A tool (speculum) will be placed in your vagina and widened so that your health care provider can see your cervix.  Medicine may be applied to your cervix to help lower your risk of infection (antiseptic medicine).  You may be given an anesthetic medicine to numb each side of your cervix (intracervical block or paracervical block). This medicine is usually given by an injection into the cervix.  A tool (uterine sound) will be inserted  into your uterus to determine the length of your uterus and the direction that your uterus may be tilted.  A slim instrument or tube (IUD inserter) that holds the IUD will be inserted into your vagina, through your cervical canal, and into your uterus.  The IUD will be placed in the uterus, and the IUD inserter will be removed.  The strings that are  attached to the IUD will be trimmed so that they lie just below the cervix. The procedure may vary among health care providers and hospitals. What happens after the procedure?  You may have bleeding after the procedure. This is normal. It varies from light bleeding (spotting) for a few days to menstrual-like bleeding.  You may have cramping and pain.  You may feel dizzy or light-headed.  You may have lower back pain. Summary  An intrauterine device (IUD) is a small, T-shaped device that has one or two nylon strings hanging down from it.  Two types of IUDs are available. You may have a copper IUD or a hormone IUD.  Schedule the IUD insertion for when you will have your menstrual period or right after, to make sure you are not pregnant. Placement of the IUD is better tolerated shortly after a menstrual cycle.  You may have bleeding after the procedure. This is normal. It varies from light spotting for a few days to menstrual-like bleeding. This information is not intended to replace advice given to you by your health care provider. Make sure you discuss any questions you have with your health care provider. Document Released: 05/02/2011 Document Revised: 07/25/2016 Document Reviewed: 07/25/2016 Elsevier Interactive Patient Education  2017 [employer]. Levonorgestrel intrauterine device (IUD) What is this medicine? LEVONORGESTREL IUD (LEE voe nor jes trel) is a contraceptive (birth control) device. The device is placed inside the uterus by a healthcare professional. It is used to prevent pregnancy. This device can also be used to treat heavy bleeding that occurs during your period. This medicine may be used for other purposes; ask your health care provider or pharmacist if you have questions. COMMON BRAND NAME(S): Minette Headland What should I tell my health care provider before I take this medicine? They need to know if you have any of these conditions: -abnormal Pap  smear -cancer of the breast, uterus, or cervix -diabetes -endometritis -genital or pelvic infection now or in the past -have more than one sexual partner or your partner has more than one partner -heart disease -history of an ectopic or tubal pregnancy -immune system problems -IUD in place -liver disease or tumor -problems with blood clots or take blood-thinners -seizures -use intravenous drugs -uterus of unusual shape -vaginal bleeding that has not been explained -an unusual or allergic reaction to levonorgestrel, other hormones, silicone, or polyethylene, medicines, foods, dyes, or preservatives -pregnant or trying to get pregnant -breast-feeding How should I use this medicine? This device is placed inside the uterus by a health care professional. Talk to your pediatrician regarding the use of this medicine in children. Special care may be needed. Overdosage: If you think you have taken too much of this medicine contact a poison control center or emergency room at once. NOTE: This medicine is only for you. Do not share this medicine with others. What if I miss a dose? This does not apply. Depending on the brand of device you have inserted, the device will need to be replaced every 3 to 5 years if you wish to continue using this type of  birth control. What may interact with this medicine? Do not take this medicine with any of the following medications: -amprenavir -bosentan -fosamprenavir This medicine may also interact with the following medications: -aprepitant -armodafinil -barbiturate medicines for inducing sleep or treating seizures -bexarotene -boceprevir -griseofulvin -medicines to treat seizures like carbamazepine, ethotoin, felbamate, oxcarbazepine, phenytoin, topiramate -modafinil -pioglitazone -rifabutin -rifampin -rifapentine -some medicines to treat HIV infection like atazanavir, efavirenz, indinavir, lopinavir, nelfinavir, tipranavir, ritonavir -[facility]. John's  wort -warfarin This list may not describe all possible interactions. Give your health care provider a list of all the medicines, herbs, non-prescription drugs, or dietary supplements you use. Also tell them if you smoke, drink alcohol, or use illegal drugs. Some items may interact with your medicine. What should I watch for while using this medicine? Visit your doctor or health care professional for regular check ups. See your doctor if you or your partner has sexual contact with others, becomes HIV positive, or gets a sexual transmitted disease. This product does not protect you against HIV infection (AIDS) or other sexually transmitted diseases. You can check the placement of the IUD yourself by reaching up to the top of your vagina with clean fingers to feel the threads. Do not pull on the threads. It is a good habit to check placement after each menstrual period. Call your doctor right away if you feel more of the IUD than just the threads or if you cannot feel the threads at all. The IUD may come out by itself. You may become pregnant if the device comes out. If you notice that the IUD has come out use a backup birth control method like condoms and call your health care provider. Using tampons will not change the position of the IUD and are okay to use during your period. This IUD can be safely scanned with magnetic resonance imaging (MRI) only under specific conditions. Before you have an MRI, tell your healthcare provider that you have an IUD in place, and which type of IUD you have in place. What side effects may I notice from receiving this medicine? Side effects that you should report to your doctor or health care professional as soon as possible: -allergic reactions like skin rash, itching or hives, swelling of the face, lips, or tongue -fever, flu-like symptoms -genital sores -high blood pressure -no menstrual period for 6 weeks during use -pain, swelling, warmth in the leg -pelvic pain  or tenderness -severe or sudden headache -signs of pregnancy -stomach cramping -sudden shortness of breath -trouble with balance, talking, or walking -unusual vaginal bleeding, discharge -yellowing of the eyes or skin Side effects that usually do not require medical attention (report to your doctor or health care professional if they continue or are bothersome): -acne -breast pain -change in sex drive or performance -changes in weight -cramping, dizziness, or faintness while the device is being inserted -headache -irregular menstrual bleeding within first 3 to 6 months of use -nausea This list may not describe all possible side effects. Call your doctor for medical advice about side effects. You may report side effects to FDA at 1-800-FDA-1088. Where should I keep my medicine? This does not apply. NOTE: This sheet is a summary. It may not cover all possible information. If you have questions about this medicine, talk to your doctor, pharmacist, or health care provider.  2018 Elsevier/Gold Standard (2016-06-15 14:14:56)

## 2017-06-14 NOTE — Progress Notes (Signed)
Subjective:    Sarah Edwards is a 26 y.o. [phone] Caucasian female who presents for a postpartum visit.   She is 6 weeks postpartum following a spontaneous vaginal delivery at 40+1 gestational weeks. Anesthesia: none. I have fully reviewed the prenatal and intrapartum course.   Postpartum course has been complicated by postpartum anemia and headache which have both since resolved. Baby's course has been uncomplicated. Baby is feeding by breast. Bleeding no bleeding. Bowel function is normal. Bladder function is normal.   Patient is not sexually active. Contraception method is abstinence. Postpartum depression screening: negative. Score 4.  Last pap 08/23/2015 and was normal.  The following portions of the patient's history were reviewed and updated as appropriate: allergies, current medications, past medical history, past surgical history and problem list.  Review of Systems  Pertinent items are noted in HPI.   Objective:   BP 119/82   Pulse 99   Ht 5' 6"  (1.676 m)   Wt 151 lb 1 oz (68.5 kg)   LMP 06/02/2017 (Exact Date)   Breastfeeding? Yes   BMI 24.38 kg/m   General:  alert, cooperative and no distress   Breasts:  deferred, no complaints  Lungs: clear to auscultation bilaterally  Heart:  regular rate and rhythm  Abdomen: soft, nontender   Vulva: normal  Vagina: normal vagina  Cervix:  closed  Corpus: Well-involuted  Adnexa:  Non-palpable         Edinburgh Postnatal Depression Scale - 06/14/17 1056      Edinburgh Postnatal Depression Scale:  In the Past 7 Days   I have been able to laugh and see the funny side of things. 0   I have looked forward with enjoyment to things. 0   I have blamed myself unnecessarily when things went wrong. 2   I have been anxious or worried for no good reason. 1   I have felt scared or panicky for no good reason. 0   Things have been getting on top of me. 0   I have been so unhappy that I have had difficulty sleeping. 0   I have felt  sad or miserable. 1   I have been so unhappy that I have been crying. 0   The thought of harming myself has occurred to me. 0   Edinburgh Postnatal Depression Scale Total 4        Assessment:   Postpartum exam Six (6) wks s/p spontaneous vaginal birth Breastfeeding Depression screening Contraception counseling   Plan:   Reviewed red flag symptoms and when to call.   RTC x 1-2 weeks for IUD placement.   RTC as needed.    Diona Fanti, CNM

## 2017-06-14 NOTE — Progress Notes (Signed)
Pt is here for a post partum visit. Breast feeding. LMP 06/02/17. Would like Mirena. Has not resumed intercourse. Refused flu.

## 2017-06-16 IMAGING — MR MR HEAD W/O CM
9 of 11 series · 34 of 48 positions shown · non-contrast
Comparison: None available.

CLINICAL DATA: Initial evaluation for double vision, headaches,
left-sided numbness for 4-5 years, worse over last year.

EXAM:
MRI HEAD WITHOUT CONTRAST
TECHNIQUE: Multiplanar, multiecho pulse sequences of the brain and surrounding
structures were obtained without intravenous contrast.

[Series 2: T1 · sagittal · 5.0mm · 0.45mm/px · 4 of 29 slices shown (1 of 2)]
[im 1/29]
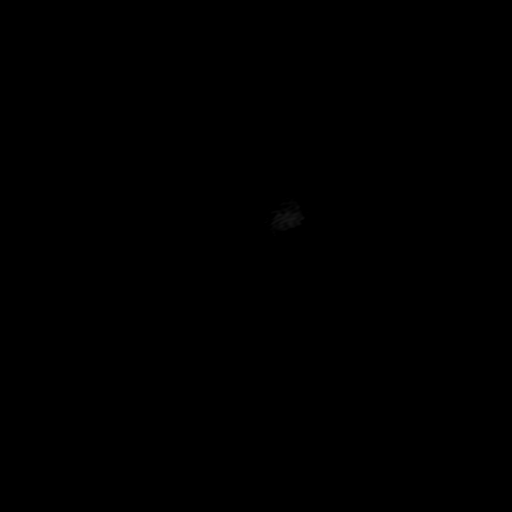
[im 10/29]
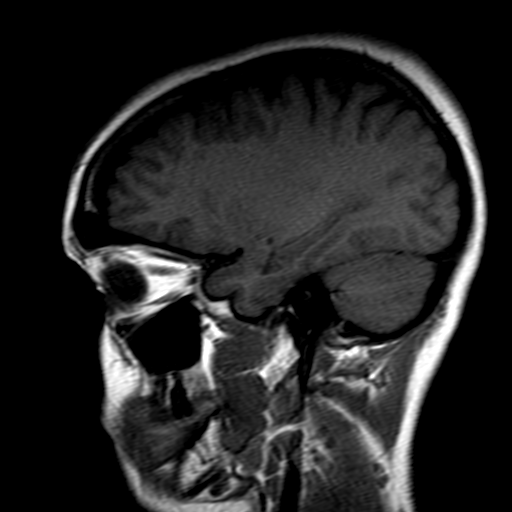
[im 19/29]
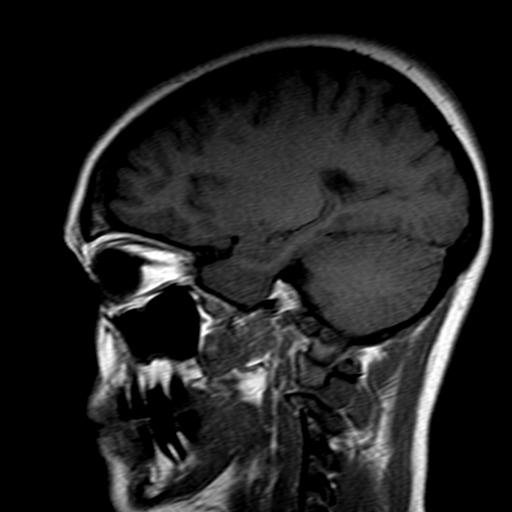
[im 29/29]
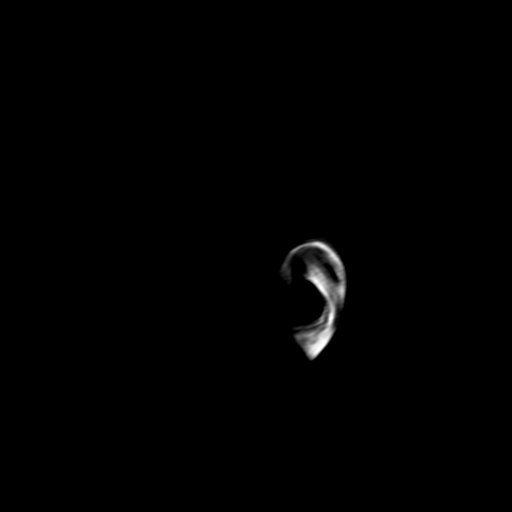

[Series 4: DWI · axial · 4.0mm · 0.94mm/px · z∈[-73,+94]mm · 5 of 43 slices shown (1 of 2)]
[im 1/43]
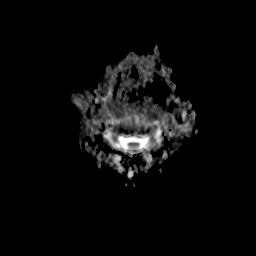
[im 11/43]
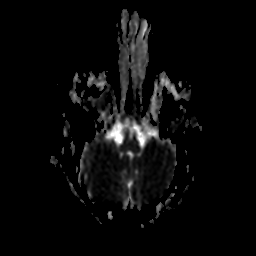
[im 22/43]
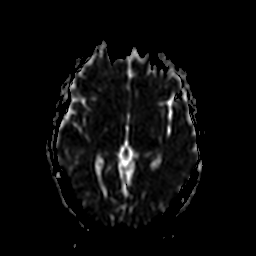
[im 32/43]
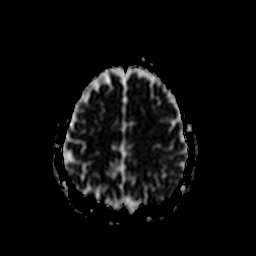
[im 43/43]
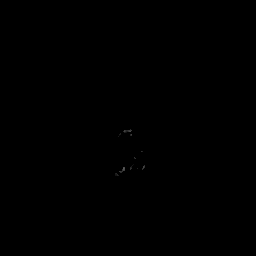

[Series 6: DWI · coronal · 5.0mm · 1.80mm/px · 5 of 39 slices shown (2 of 2)]
[im 1/39]
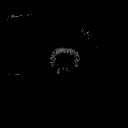
[im 10/39]
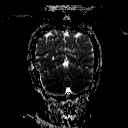
[im 20/39]
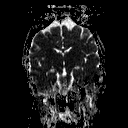
[im 29/39]
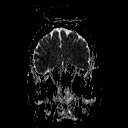
[im 39/39]
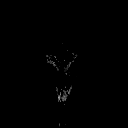

[Series 9: T2 · axial · 5.0mm · 0.45mm/px · z∈[-69,+99]mm · 3 of 27 slices shown (1 of 3)]
[im 1/27]
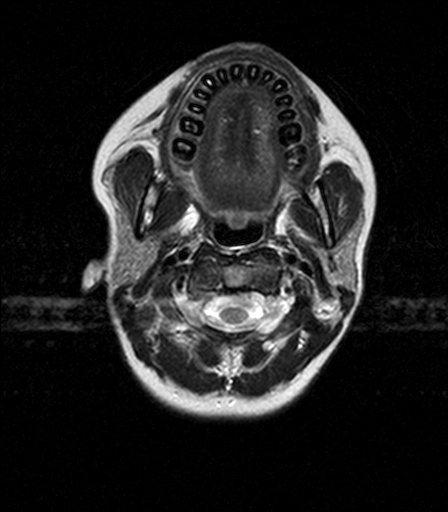
[im 14/27]
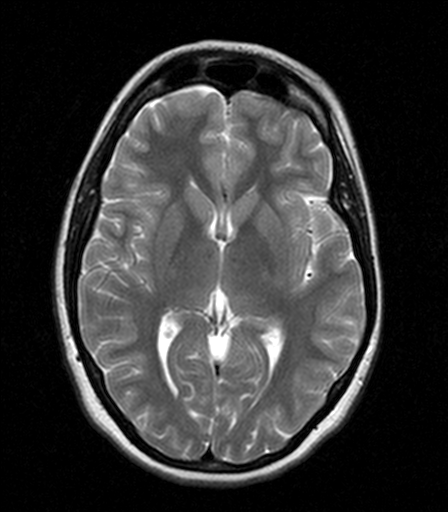
[im 27/27]
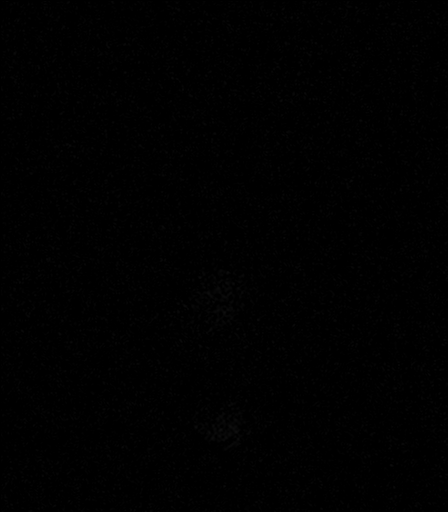

[Series 10: FLAIR · axial · 5.0mm · 0.90mm/px · z∈[-69,+99]mm · 3 of 27 slices shown (1 of 2)]
[im 1/27]
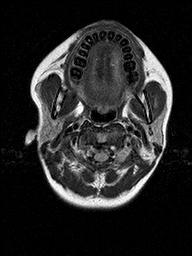
[im 14/27]
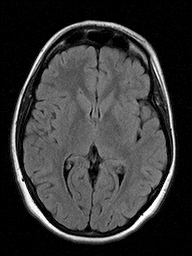
[im 27/27]
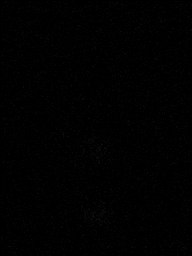

[Series 11: T2 · axial · 5.0mm · 0.45mm/px · z∈[-69,+99]mm · 3 of 27 slices shown (2 of 3)]
[im 1/27]
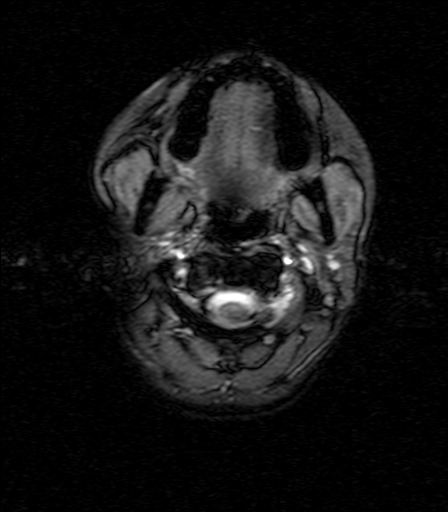
[im 14/27]
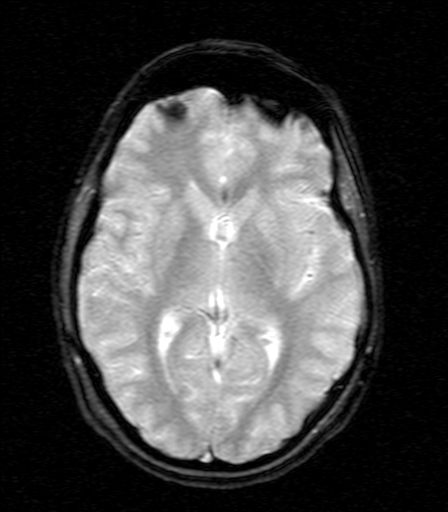
[im 27/27]
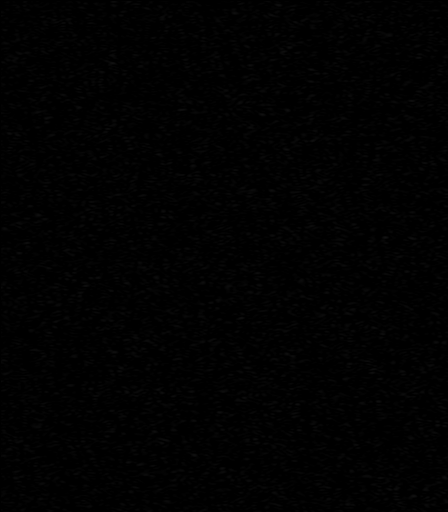

[Series 12: T1 · axial · 3.0mm · 0.45mm/px · z∈[-73,-17]mm · 3 of 60 slices shown (2 of 2)]
[im 1/60]
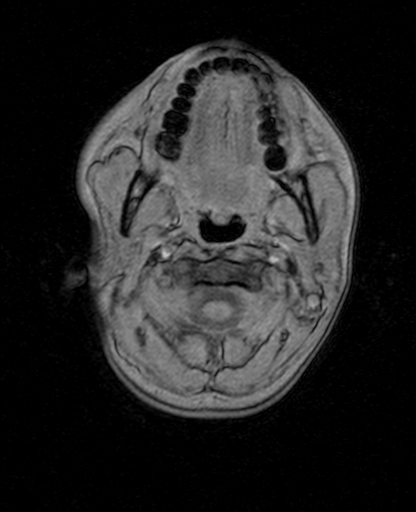
[im 10/60]
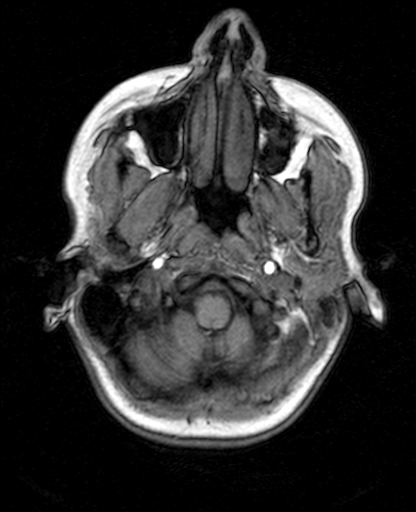
[im 20/60]
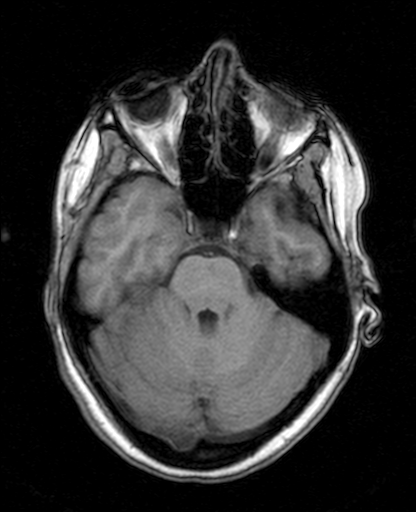

[Series 13: T2 · coronal · 5.0mm · 0.45mm/px · 4 of 31 slices shown (3 of 3)]
[im 1/31]
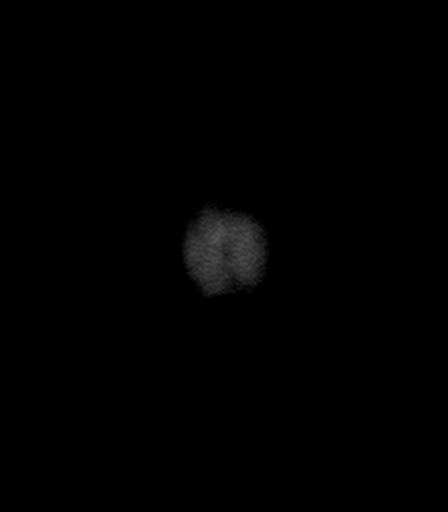
[im 11/31]
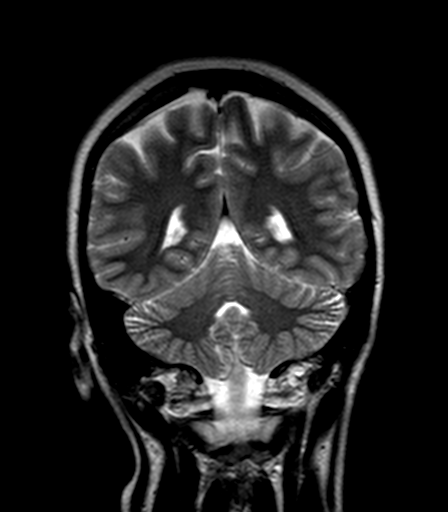
[im 21/31]
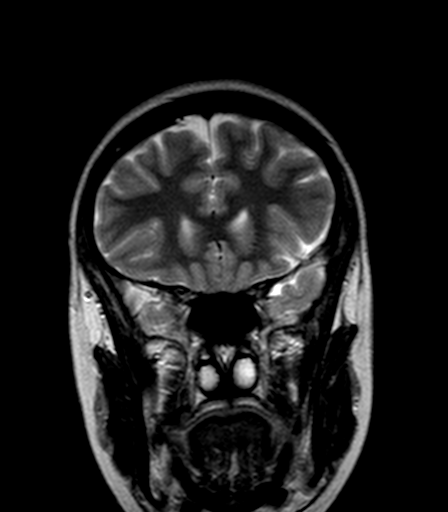
[im 31/31]
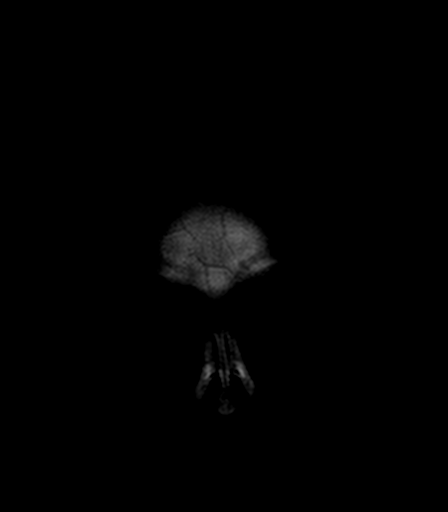

[Series 14: FLAIR · sagittal · 5.0mm · 0.90mm/px · 4 of 29 slices shown (2 of 2)]
[im 1/29]
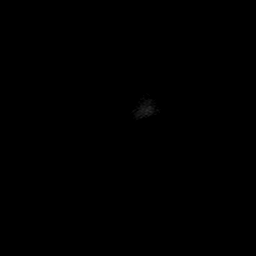
[im 10/29]
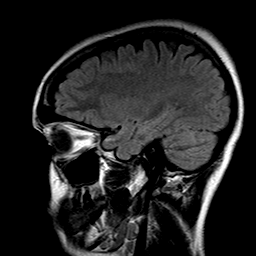
[im 19/29]
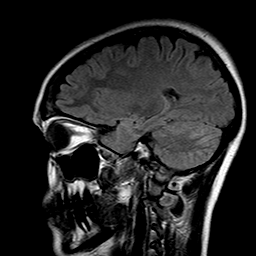
[im 29/29]
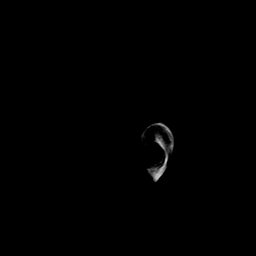

[34 of 48 positions shown; findings below may reference images not displayed]

FINDINGS: Brain: Cerebral volume is within normal limits. No focal parenchymal
signal abnormality identified. Specifically, no cerebral white
matter changes to suggest underlying demyelinating disease.

No abnormal foci of restricted diffusion to suggest acute or
subacute ischemia or active demyelination. Gray-white matter
differentiation well maintained. No evidence for acute or chronic
intracranial hemorrhage. No evidence of chronic infarction.

No mass lesion, midline shift, or mass effect. No hydrocephalus. No
extra-axial fluid collection. Major dural sinuses are patent.

Pituitary gland within normal limits.

Vascular: Major intracranial vascular flow voids are well preserved
and are normal.

Skull and upper cervical spine: Craniocervical junction normal.
Vessel as the upper cervical spine unremarkable. Bone marrow signal
intensity within normal limits. No scalp soft tissue abnormality.

Sinuses/Orbits: Globes and orbital soft tissues within normal
limits. Paranasal sinuses are clear. Small bilateral mastoid
effusions noted, of doubtful clinical significance. Inner ear
structures grossly normal.
IMPRESSION: Normal MRI of the brain.

## 2017-06-28 ENCOUNTER — Inpatient Hospital Stay: Payer: 59 | Attending: Oncology

## 2017-06-28 ENCOUNTER — Encounter: Payer: Self-pay | Admitting: Certified Nurse Midwife

## 2017-06-28 ENCOUNTER — Ambulatory Visit (INDEPENDENT_AMBULATORY_CARE_PROVIDER_SITE_OTHER): Payer: 59 | Admitting: Certified Nurse Midwife

## 2017-06-28 VITALS — BP 116/74 | HR 87 | Ht 66.0 in | Wt 157.5 lb

## 2017-06-28 DIAGNOSIS — D509 Iron deficiency anemia, unspecified: Secondary | ICD-10-CM | POA: Insufficient documentation

## 2017-06-28 DIAGNOSIS — Z79899 Other long term (current) drug therapy: Secondary | ICD-10-CM | POA: Insufficient documentation

## 2017-06-28 DIAGNOSIS — F419 Anxiety disorder, unspecified: Secondary | ICD-10-CM | POA: Insufficient documentation

## 2017-06-28 DIAGNOSIS — Z87891 Personal history of nicotine dependence: Secondary | ICD-10-CM | POA: Insufficient documentation

## 2017-06-28 DIAGNOSIS — K5090 Crohn's disease, unspecified, without complications: Secondary | ICD-10-CM | POA: Diagnosis not present

## 2017-06-28 DIAGNOSIS — D519 Vitamin B12 deficiency anemia, unspecified: Secondary | ICD-10-CM | POA: Insufficient documentation

## 2017-06-28 DIAGNOSIS — E538 Deficiency of other specified B group vitamins: Secondary | ICD-10-CM

## 2017-06-28 DIAGNOSIS — Z30430 Encounter for insertion of intrauterine contraceptive device: Secondary | ICD-10-CM

## 2017-06-28 LAB — CBC WITH DIFFERENTIAL/PLATELET
Basophils Absolute: 0 10*3/uL (ref 0–0.1)
Basophils Relative: 1 %
Eosinophils Absolute: 0.1 10*3/uL (ref 0–0.7)
Eosinophils Relative: 1 %
HEMATOCRIT: 40.5 % (ref 35.0–47.0)
Hemoglobin: 13.4 g/dL (ref 12.0–16.0)
LYMPHS PCT: 27 %
Lymphs Abs: 1.4 10*3/uL (ref 1.0–3.6)
MCH: 27.9 pg (ref 26.0–34.0)
MCHC: 33.1 g/dL (ref 32.0–36.0)
MCV: 84.3 fL (ref 80.0–100.0)
MONO ABS: 0.2 10*3/uL (ref 0.2–0.9)
MONOS PCT: 5 %
NEUTROS ABS: 3.4 10*3/uL (ref 1.4–6.5)
Neutrophils Relative %: 66 %
Platelets: 226 10*3/uL (ref 150–440)
RBC: 4.81 MIL/uL (ref 3.80–5.20)
RDW: 15.5 % — AB (ref 11.5–14.5)
WBC: 5.1 10*3/uL (ref 3.6–11.0)

## 2017-06-28 LAB — VITAMIN B12: Vitamin B-12: 729 pg/mL (ref 180–914)

## 2017-06-28 LAB — IRON AND TIBC
Iron: 61 ug/dL (ref 28–170)
Saturation Ratios: 15 % (ref 10.4–31.8)
TIBC: 414 ug/dL (ref 250–450)
UIBC: 353 ug/dL

## 2017-06-28 LAB — FERRITIN: Ferritin: 24 ng/mL (ref 11–307)

## 2017-06-28 NOTE — Progress Notes (Signed)
Pt is here for a Mirena IUD insertion. Informed consent signed.

## 2017-06-28 NOTE — Progress Notes (Signed)
Sarah Edwards is a 26 y.o. year old [id number] Caucasian female who presents for placement of a Mirena IUD.  Patient's last menstrual period was 06/02/2017 (exact date). BP 116/74   Pulse 87   Ht 5' 6"  (1.676 m)   Wt 157 lb 8 oz (71.4 kg)   LMP 06/02/2017 (Exact Date)   BMI 25.42 kg/m    Last sexual intercourse was last week, and pregnancy test today was negative.  The risks and benefits of the method and placement have been thouroughly reviewed with the patient and all questions were answered.  Specifically the patient is aware of failure rate of 09/998, expulsion of the IUD and of possible perforation.  The patient is aware of irregular bleeding due to the method and understands the incidence of irregular bleeding diminishes with time.  Signed copy of informed consent in chart.   Time out was performed.  A medium plastic speculum was placed in the vagina.  The cervix was visualized, prepped using Betadine, and grasped with a single tooth tenaculum. The uterus was found to be anteroflexed and it sounded to 8 cm.  Mirena IUD placed per manufacturer's recommendations.   The strings were trimmed to 3 cm.  The patient was given post procedure instructions, including signs and symptoms of infection and to check for the strings after each menses or each month, and refraining from intercourse or anything in the vagina for 3 days.  She was given a Mirena care card with date Mirena placed, and date Mirena to be removed.  Reviewed red flag symptoms and when to call.   RTC x 4-6 weeks for IUD string check or sooner if needed.   Diona Fanti, CNM [facility], [facility]

## 2017-06-28 NOTE — Patient Instructions (Signed)

## 2017-08-05 ENCOUNTER — Telehealth: Payer: Self-pay | Admitting: Certified Nurse Midwife

## 2017-08-05 NOTE — Telephone Encounter (Signed)
LMTRC

## 2017-08-05 NOTE — Telephone Encounter (Signed)
Patient called stating she has been bleeding since she got her IUD inserted 2 months ago. She doesn't feel this is normal and would like a call back. Thanks

## 2017-08-06 NOTE — Telephone Encounter (Signed)
Pt is s/p mirena insertion 6 weeks ago. She has had bleeding since insertion. Lite at times and heavier at other times. Wearing a panty liner and pad daily. Changing q 3 hours. NO cramps. NO pain with IC. Advised that not uncommon to have irregular bleeding for the first 3-6 months after insertion. Continue to monitor and keep f/u string check. Pt does c/o of weight gain since insertion. Have not heard that mirena causes wt gain.  Advised pt to increase exercise and decrease calories.

## 2017-08-13 ENCOUNTER — Encounter: Payer: Self-pay | Admitting: Oncology

## 2017-08-13 ENCOUNTER — Other Ambulatory Visit: Payer: Self-pay

## 2017-08-13 ENCOUNTER — Encounter: Payer: Self-pay | Admitting: Certified Nurse Midwife

## 2017-08-13 ENCOUNTER — Inpatient Hospital Stay: Payer: 59 | Attending: Oncology

## 2017-08-13 ENCOUNTER — Other Ambulatory Visit: Payer: Self-pay | Admitting: Oncology

## 2017-08-13 ENCOUNTER — Inpatient Hospital Stay (HOSPITAL_BASED_OUTPATIENT_CLINIC_OR_DEPARTMENT_OTHER): Payer: 59 | Admitting: Oncology

## 2017-08-13 ENCOUNTER — Ambulatory Visit (INDEPENDENT_AMBULATORY_CARE_PROVIDER_SITE_OTHER): Payer: 59 | Admitting: Certified Nurse Midwife

## 2017-08-13 VITALS — BP 113/64 | HR 75 | Wt 162.1 lb

## 2017-08-13 VITALS — BP 109/71 | HR 88 | Temp 98.0°F | Resp 12 | Wt 164.0 lb

## 2017-08-13 DIAGNOSIS — E538 Deficiency of other specified B group vitamins: Secondary | ICD-10-CM | POA: Diagnosis not present

## 2017-08-13 DIAGNOSIS — K5090 Crohn's disease, unspecified, without complications: Secondary | ICD-10-CM | POA: Diagnosis not present

## 2017-08-13 DIAGNOSIS — Z30431 Encounter for routine checking of intrauterine contraceptive device: Secondary | ICD-10-CM

## 2017-08-13 DIAGNOSIS — F419 Anxiety disorder, unspecified: Secondary | ICD-10-CM | POA: Diagnosis not present

## 2017-08-13 DIAGNOSIS — Z79899 Other long term (current) drug therapy: Secondary | ICD-10-CM | POA: Diagnosis not present

## 2017-08-13 DIAGNOSIS — D509 Iron deficiency anemia, unspecified: Secondary | ICD-10-CM | POA: Diagnosis not present

## 2017-08-13 DIAGNOSIS — K589 Irritable bowel syndrome without diarrhea: Secondary | ICD-10-CM | POA: Insufficient documentation

## 2017-08-13 DIAGNOSIS — Z87891 Personal history of nicotine dependence: Secondary | ICD-10-CM | POA: Insufficient documentation

## 2017-08-13 LAB — CBC WITH DIFFERENTIAL/PLATELET
BASOS PCT: 1 %
Basophils Absolute: 0 10*3/uL (ref 0–0.1)
Eosinophils Absolute: 0.1 10*3/uL (ref 0–0.7)
Eosinophils Relative: 1 %
HEMATOCRIT: 38.9 % (ref 35.0–47.0)
HEMOGLOBIN: 12.8 g/dL (ref 12.0–16.0)
LYMPHS ABS: 1.4 10*3/uL (ref 1.0–3.6)
Lymphocytes Relative: 26 %
MCH: 27.3 pg (ref 26.0–34.0)
MCHC: 32.8 g/dL (ref 32.0–36.0)
MCV: 83.2 fL (ref 80.0–100.0)
MONOS PCT: 7 %
Monocytes Absolute: 0.4 10*3/uL (ref 0.2–0.9)
NEUTROS ABS: 3.6 10*3/uL (ref 1.4–6.5)
NEUTROS PCT: 65 %
Platelets: 229 10*3/uL (ref 150–440)
RBC: 4.68 MIL/uL (ref 3.80–5.20)
RDW: 14.2 % (ref 11.5–14.5)
WBC: 5.4 10*3/uL (ref 3.6–11.0)

## 2017-08-13 LAB — IRON AND TIBC
Iron: 51 ug/dL (ref 28–170)
SATURATION RATIOS: 13 % (ref 10.4–31.8)
TIBC: 395 ug/dL (ref 250–450)
UIBC: 344 ug/dL

## 2017-08-13 LAB — VITAMIN B12: Vitamin B-12: 519 pg/mL (ref 180–914)

## 2017-08-13 LAB — FERRITIN: Ferritin: 10 ng/mL — ABNORMAL LOW (ref 11–307)

## 2017-08-13 NOTE — Progress Notes (Signed)
     GYNECOLOGY OFFICE PROGRESS NOTE  History:  26 y.o. [id number] here today for today for IUD string check; Mirena IUD was placed  06/28/2017. No complaints about the IUD except vaginal spotting, no concerning side effects.  The following portions of the patient's history were reviewed and updated as appropriate: allergies, current medications, past family history, past medical history, past social history, past surgical history and problem list. Last pap smear on 08/2015 was normal.  Review of Systems:   Pertinent items are noted in HPI.  Objective:   BP 113/64   Pulse 75   Wt 162 lb 1.6 oz (73.5 kg)   Breastfeeding? No   BMI 26.16 kg/m   Physical Exam  CONSTITUTIONAL: Well-developed, well-nourished female in no acute distress.   HENT:  Normocephalic, atraumatic. External right and left ear normal. Oropharynx is clear and moist  EYES: Conjunctivae and EOM are normal. Pupils are equal, round, and reactive to light. No scleral icterus.   NECK: Normal range of motion, supple, no masses  ABDOMEN: Soft, no distention noted.    PELVIC: Normal appearing external genitalia; normal appearing vaginal mucosa and cervix.  IUD strings visualized, about 3 cm in length outside cervix.   Assessment & Plan:   Normal IUD check.  Patient to keep IUD in place for five years; can come in for removal if she desires pregnancy within the next five years.  Routine preventative health maintenance measures emphasized.  Sample of Lo Loestrin given.   Reviewed red flag symptoms and when to call.   RTC x 1 year for annual exam or sooner if needed.    Diona Fanti, CNM [facility], [facility]

## 2017-08-13 NOTE — Patient Instructions (Signed)
Preventive Care 18-39 Years, Female Preventive care refers to lifestyle choices and visits with your health care provider that can promote health and wellness. What does preventive care include?  A yearly physical exam. This is also called an annual well check.  Dental exams once or twice a year.  Routine eye exams. Ask your health care provider how often you should have your eyes checked.  Personal lifestyle choices, including: ? Daily care of your teeth and gums. ? Regular physical activity. ? Eating a healthy diet. ? Avoiding tobacco and drug use. ? Limiting alcohol use. ? Practicing safe sex. ? Taking vitamin and mineral supplements as recommended by your health care provider. What happens during an annual well check? The services and screenings done by your health care provider during your annual well check will depend on your age, overall health, lifestyle risk factors, and family history of disease. Counseling Your health care provider may ask you questions about your:  Alcohol use.  Tobacco use.  Drug use.  Emotional well-being.  Home and relationship well-being.  Sexual activity.  Eating habits.  Work and work Statistician.  Method of birth control.  Menstrual cycle.  Pregnancy history.  Screening You may have the following tests or measurements:  Height, weight, and BMI.  Diabetes screening. This is done by checking your blood sugar (glucose) after you have not eaten for a while (fasting).  Blood pressure.  Lipid and cholesterol levels. These may be checked every 5 years starting at age 66.  Skin check.  Hepatitis C blood test.  Hepatitis B blood test.  Sexually transmitted disease (STD) testing.  BRCA-related cancer screening. This may be done if you have a family history of breast, ovarian, tubal, or peritoneal cancers.  Pelvic exam and Pap test. This may be done every 3 years starting at age 40. Starting at age 59, this may be done every 5  years if you have a Pap test in combination with an HPV test.  Discuss your test results, treatment options, and if necessary, the need for more tests with your health care provider. Vaccines Your health care provider may recommend certain vaccines, such as:  Influenza vaccine. This is recommended every year.  Tetanus, diphtheria, and acellular pertussis (Tdap, Td) vaccine. You may need a Td booster every 10 years.  Varicella vaccine. You may need this if you have not been vaccinated.  HPV vaccine. If you are 69 or younger, you may need three doses over 6 months.  Measles, mumps, and rubella (MMR) vaccine. You may need at least one dose of MMR. You may also need a second dose.  Pneumococcal 13-valent conjugate (PCV13) vaccine. You may need this if you have certain conditions and were not previously vaccinated.  Pneumococcal polysaccharide (PPSV23) vaccine. You may need one or two doses if you smoke cigarettes or if you have certain conditions.  Meningococcal vaccine. One dose is recommended if you are age 27-21 years and a first-year college student living in a residence hall, or if you have one of several medical conditions. You may also need additional booster doses.  Hepatitis A vaccine. You may need this if you have certain conditions or if you travel or work in places where you may be exposed to hepatitis A.  Hepatitis B vaccine. You may need this if you have certain conditions or if you travel or work in places where you may be exposed to hepatitis B.  Haemophilus influenzae type b (Hib) vaccine. You may need this if  you have certain risk factors.  Talk to your health care provider about which screenings and vaccines you need and how often you need them. This information is not intended to replace advice given to you by your health care provider. Make sure you discuss any questions you have with your health care provider. Document Released: 10/30/2001 Document Revised: 05/23/2016  Document Reviewed: 07/05/2015 Elsevier Interactive Patient Education  2017 [employer]. Levonorgestrel intrauterine device (IUD) What is this medicine? LEVONORGESTREL IUD (LEE voe nor jes trel) is a contraceptive (birth control) device. The device is placed inside the uterus by a healthcare professional. It is used to prevent pregnancy. This device can also be used to treat heavy bleeding that occurs during your period. This medicine may be used for other purposes; ask your health care provider or pharmacist if you have questions. COMMON BRAND NAME(S): Minette Headland What should I tell my health care provider before I take this medicine? They need to know if you have any of these conditions: -abnormal Pap smear -cancer of the breast, uterus, or cervix -diabetes -endometritis -genital or pelvic infection now or in the past -have more than one sexual partner or your partner has more than one partner -heart disease -history of an ectopic or tubal pregnancy -immune system problems -IUD in place -liver disease or tumor -problems with blood clots or take blood-thinners -seizures -use intravenous drugs -uterus of unusual shape -vaginal bleeding that has not been explained -an unusual or allergic reaction to levonorgestrel, other hormones, silicone, or polyethylene, medicines, foods, dyes, or preservatives -pregnant or trying to get pregnant -breast-feeding How should I use this medicine? This device is placed inside the uterus by a health care professional. Talk to your pediatrician regarding the use of this medicine in children. Special care may be needed. Overdosage: If you think you have taken too much of this medicine contact a poison control center or emergency room at once. NOTE: This medicine is only for you. Do not share this medicine with others. What if I miss a dose? This does not apply. Depending on the brand of device you have inserted, the device will need  to be replaced every 3 to 5 years if you wish to continue using this type of birth control. What may interact with this medicine? Do not take this medicine with any of the following medications: -amprenavir -bosentan -fosamprenavir This medicine may also interact with the following medications: -aprepitant -armodafinil -barbiturate medicines for inducing sleep or treating seizures -bexarotene -boceprevir -griseofulvin -medicines to treat seizures like carbamazepine, ethotoin, felbamate, oxcarbazepine, phenytoin, topiramate -modafinil -pioglitazone -rifabutin -rifampin -rifapentine -some medicines to treat HIV infection like atazanavir, efavirenz, indinavir, lopinavir, nelfinavir, tipranavir, ritonavir -St. John's wort -warfarin This list may not describe all possible interactions. Give your health care provider a list of all the medicines, herbs, non-prescription drugs, or dietary supplements you use. Also tell them if you smoke, drink alcohol, or use illegal drugs. Some items may interact with your medicine. What should I watch for while using this medicine? Visit your doctor or health care professional for regular check ups. See your doctor if you or your partner has sexual contact with others, becomes HIV positive, or gets a sexual transmitted disease. This product does not protect you against HIV infection (AIDS) or other sexually transmitted diseases. You can check the placement of the IUD yourself by reaching up to the top of your vagina with clean fingers to feel the threads. Do not pull on the threads. It  is a good habit to check placement after each menstrual period. Call your doctor right away if you feel more of the IUD than just the threads or if you cannot feel the threads at all. The IUD may come out by itself. You may become pregnant if the device comes out. If you notice that the IUD has come out use a backup birth control method like condoms and call your health care  provider. Using tampons will not change the position of the IUD and are okay to use during your period. This IUD can be safely scanned with magnetic resonance imaging (MRI) only under specific conditions. Before you have an MRI, tell your healthcare provider that you have an IUD in place, and which type of IUD you have in place. What side effects may I notice from receiving this medicine? Side effects that you should report to your doctor or health care professional as soon as possible: -allergic reactions like skin rash, itching or hives, swelling of the face, lips, or tongue -fever, flu-like symptoms -genital sores -high blood pressure -no menstrual period for 6 weeks during use -pain, swelling, warmth in the leg -pelvic pain or tenderness -severe or sudden headache -signs of pregnancy -stomach cramping -sudden shortness of breath -trouble with balance, talking, or walking -unusual vaginal bleeding, discharge -yellowing of the eyes or skin Side effects that usually do not require medical attention (report to your doctor or health care professional if they continue or are bothersome): -acne -breast pain -change in sex drive or performance -changes in weight -cramping, dizziness, or faintness while the device is being inserted -headache -irregular menstrual bleeding within first 3 to 6 months of use -nausea This list may not describe all possible side effects. Call your doctor for medical advice about side effects. You may report side effects to FDA at 1-800-FDA-1088. Where should I keep my medicine? This does not apply. NOTE: This sheet is a summary. It may not cover all possible information. If you have questions about this medicine, talk to your doctor, pharmacist, or health care provider.  2018 Elsevier/Gold Standard (2016-06-15 14:14:56)

## 2017-08-13 NOTE — Progress Notes (Signed)
Patient here for follow up with labs today. She states that she has been having vaginal bleeding since she delivered her baby on August 2nd. She has had an IUD in place for a month. She was started on Loestrin temporarily today by Dani Gobble, Midwife at [facility], to help stop the bleeding. She states that she does feel tired, but denies having any pain or shortness of breath.

## 2017-08-13 NOTE — Progress Notes (Signed)
Hematology/Oncology Consult note [facility]  [phone] [phone]  Patient Care Team: Glean Hess, MD as PCP - General (Family Medicine)   Name of the patient: Sarah Edwards  [id number]  March 28, 1991   Date of visit: 08/13/17  Diagnosis- B12 and iron deficiency anemia  Chief complaint/ Reason for visit- routine f/u  Heme/Onc history: patient is a 26 year old female who was referred to [place] for anemia of pregnancy. CBC from 09/24/2016 showed white count of 7.1, H&H of 10.2/30.6 with an MCV of 80.6 and a platelet count of 196. Her H&H over the last 6 months has been between 10-11. On 03/05/2017 her H&H was 8.8/29.3. B12 levels were low normal at 253 and folate level was normal. This is her second pregnancy and her first pregnancy was about 5 years ago. She did not have any complications during that pregnancy. Within normal vaginal delivery although she says that she was anemic at that time which did get better on its own. Also reports that she had anemia outside of her pregnancy is well. She has been trying to eat iron rich foods as well as take oral iron and liquid iron but she has not been able to tolerate iron supplements due to nausea. Denies any blood loss in her urine or stool. No family history of anemia.  CBC on 03/21/2017 showed white count of 6.4, H&H of 8.7/26.8 with an MCV of 70.5 and a platelet count of 181. CMP showed low potassium of 2.9. Ferritin was low at 14. Iron studies showed low iron saturation of 3% and low serum iron of 20. ESR was elevated at 70. TSH was normal. Multiple myeloma panel did not reveal any monoclonal protein. Haptoglobin was normal. Reticulocyte count was low normal at 1.7%.  Pregnancy was complicated by retained placenta that was removed without surgical intervention but she did have signifcant blood loss at that time requiring blood transfusion    Interval history- currently she is doing well and  reports no complaints.  She is not taking any oral iron or oral B12.  ECOG PS- 0 Pain scale- 0   Review of systems- Review of Systems  Constitutional: Negative for chills, fever, malaise/fatigue and weight loss.  HENT: Negative for congestion, ear discharge and nosebleeds.   Eyes: Negative for blurred vision.  Respiratory: Negative for cough, hemoptysis, sputum production, shortness of breath and wheezing.   Cardiovascular: Negative for chest pain, palpitations, orthopnea and claudication.  Gastrointestinal: Negative for abdominal pain, blood in stool, constipation, diarrhea, heartburn, melena, nausea and vomiting.  Genitourinary: Negative for dysuria, flank pain, frequency, hematuria and urgency.  Musculoskeletal: Negative for back pain, joint pain and myalgias.  Skin: Negative for rash.  Neurological: Negative for dizziness, tingling, focal weakness, seizures, weakness and headaches.  Endo/Heme/Allergies: Does not bruise/bleed easily.  Psychiatric/Behavioral: Negative for depression and suicidal ideas. The patient does not have insomnia.       Allergies  Allergen Reactions  . Azithromycin Swelling  . Amoxicillin Rash  . Penicillins Rash    Childhood reaction  . Sulfa Antibiotics Nausea And Vomiting    Constant vomiting     Past Medical History:  Diagnosis Date  . Abnormal uterine bleeding (AUB)   . Anemia   . Anxiety   . Chronic UTI   . Crohn disease ([place])   . Endometriosis   . IBS (irritable bowel syndrome)   . Painful menstrual periods   . SAB (spontaneous abortion) 01/06/2014     Past  Surgical History:  Procedure Laterality Date  . COLONOSCOPY N/A 2010 and 2011   crohns  . ESOPHAGOGASTRODUODENOSCOPY ENDOSCOPY N/A 2010  . laproscopy  2012    Social History   Socioeconomic History  . Marital status: Single    Spouse name: Not on file  . Number of children: Not on file  . Years of education: Not on file  . Highest education level: Not on file  Social  Needs  . Financial resource strain: Not on file  . Food insecurity - worry: Not on file  . Food insecurity - inability: Not on file  . Transportation needs - medical: Not on file  . 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 Smoking status: Former Smoker    Packs/day: 0.50    Years: 3.00    Pack years: 1.50    Types: Cigarettes    Last attempt to quit: 03/18/2015    Years since quitting: 2.4  . Smokeless tobacco: Never Used  Substance and Sexual Activity  . Alcohol use: No    Alcohol/week: 0.0 oz    Comment: occas - prior to pregnancy.  . Drug use: No  . Sexual activity: Yes    Birth control/protection: None  Other Topics Concern  . Not on file  Social History Narrative   Lives in [place] with husband and dtr.  Does not routinely exercise.    Family History  Problem Relation Age of Onset  . Cancer Paternal Grandmother        breast   . Ovarian cancer Paternal Grandmother   . Cervical cancer Paternal Grandmother   . Lung cancer Paternal Grandfather   . Leukemia Maternal Grandfather   . Cancer Maternal Grandfather   . Diabetes Mother   . Diabetes Father   . Cancer Paternal Aunt      Current Outpatient Medications:  .  norethindrone-ethinyl estradiol (JUNEL FE,GILDESS FE,LOESTRIN FE) 1-20 MG-MCG tablet, Take 1 tablet by mouth daily., Disp: , Rfl:  .  ibuprofen (ADVIL,MOTRIN) 600 MG tablet, Take 1 tablet (600 mg total) by mouth every 6 (six) hours. (Patient not taking: Reported on 08/13/2017), Disp: 30 tablet, Rfl: 0 .  naproxen (NAPROSYN) 500 MG tablet, Take 1 tablet (500 mg total) by mouth 2 (two) times daily with a meal. As needed for pain (Patient not taking: Reported on 08/13/2017), Disp: 60 tablet, Rfl: 2  Physical exam:  Vitals:   08/13/17 1404  BP: 109/71  Pulse: 88  Resp: 12  Temp: 98 F (36.7 C)  TempSrc: Tympanic  Weight: 164 lb (74.4 kg)   Physical Exam    Constitutional: She is oriented to person, place, and time and well-developed, well-nourished, and in no distress.  HENT:  Head: Normocephalic and atraumatic.  Eyes: EOM are normal. Pupils are equal, round, and reactive to light.  Neck: Normal range of motion.  Cardiovascular: Normal rate, regular rhythm and normal heart sounds.  Pulmonary/Chest: Effort normal and breath sounds normal.  Abdominal: Soft. Bowel sounds are normal.  Neurological: She is alert and oriented to person, place, and time.  Skin: Skin is warm and dry.     CMP Latest Ref Rng & Units 03/28/2017  Glucose 65 - 99 mg/dL 110(H)  BUN 6 - 20 mg/dL 10  Creatinine 0.44 - 1.00 mg/dL 0.52  Sodium 135 - 145 mmol/L 135  Potassium 3.5 - 5.1 mmol/L 3.8  Chloride 101 - 111 mmol/L  104  CO2 22 - 32 mmol/L 23  Calcium 8.9 - 10.3 mg/dL 8.8(L)  Total Protein 6.5 - 8.1 g/dL -  Total Bilirubin 0.3 - 1.2 mg/dL -  Alkaline Phos 38 - 126 U/L -  AST 15 - 41 U/L -  ALT 14 - 54 U/L -   CBC Latest Ref Rng & Units 08/13/2017  WBC 3.6 - 11.0 K/uL 5.4  Hemoglobin 12.0 - 16.0 g/dL 12.8  Hematocrit 35.0 - 47.0 % 38.9  Platelets 150 - 440 K/uL 229     Assessment and plan- Patient is a 26 y.o. female with iron and B12 deficiency anemia noted during pregnancy.  Today her H&H is normal and I do not anticipate that she will need IV iron.   iron studies from today are pending.  She did have low ferritin of 25 when checked last month.  If her ferritin continues to be low I will plan to give her 2 more doses of Feraheme.  We will repeat CBC ferritin and iron studies in 3 and 6 months.  I will repeat a B12 level at 6 months and see her back in 6 months   Visit Diagnosis 1. Iron deficiency anemia, unspecified iron deficiency anemia type   2. B12 deficiency      Dr. Randa Evens, MD, MPH [facility] Pager- [phone] 08/13/2017 2:18 PM

## 2017-08-26 ENCOUNTER — Inpatient Hospital Stay: Payer: 59 | Attending: Oncology

## 2017-08-26 DIAGNOSIS — K589 Irritable bowel syndrome without diarrhea: Secondary | ICD-10-CM | POA: Insufficient documentation

## 2017-08-26 DIAGNOSIS — K5090 Crohn's disease, unspecified, without complications: Secondary | ICD-10-CM | POA: Insufficient documentation

## 2017-08-26 DIAGNOSIS — F419 Anxiety disorder, unspecified: Secondary | ICD-10-CM | POA: Insufficient documentation

## 2017-08-26 DIAGNOSIS — E538 Deficiency of other specified B group vitamins: Secondary | ICD-10-CM | POA: Insufficient documentation

## 2017-08-26 DIAGNOSIS — Z79899 Other long term (current) drug therapy: Secondary | ICD-10-CM | POA: Insufficient documentation

## 2017-08-26 DIAGNOSIS — Z87891 Personal history of nicotine dependence: Secondary | ICD-10-CM | POA: Insufficient documentation

## 2017-08-26 DIAGNOSIS — D509 Iron deficiency anemia, unspecified: Secondary | ICD-10-CM | POA: Insufficient documentation

## 2017-09-02 ENCOUNTER — Inpatient Hospital Stay: Payer: 59

## 2017-09-02 VITALS — BP 108/73 | HR 89 | Temp 96.2°F | Resp 16

## 2017-09-02 DIAGNOSIS — K589 Irritable bowel syndrome without diarrhea: Secondary | ICD-10-CM | POA: Diagnosis not present

## 2017-09-02 DIAGNOSIS — K5090 Crohn's disease, unspecified, without complications: Secondary | ICD-10-CM | POA: Diagnosis not present

## 2017-09-02 DIAGNOSIS — O99013 Anemia complicating pregnancy, third trimester: Secondary | ICD-10-CM

## 2017-09-02 DIAGNOSIS — Z87891 Personal history of nicotine dependence: Secondary | ICD-10-CM | POA: Diagnosis not present

## 2017-09-02 DIAGNOSIS — E538 Deficiency of other specified B group vitamins: Secondary | ICD-10-CM | POA: Diagnosis not present

## 2017-09-02 DIAGNOSIS — Z79899 Other long term (current) drug therapy: Secondary | ICD-10-CM | POA: Diagnosis not present

## 2017-09-02 DIAGNOSIS — F419 Anxiety disorder, unspecified: Secondary | ICD-10-CM | POA: Diagnosis not present

## 2017-09-02 DIAGNOSIS — D509 Iron deficiency anemia, unspecified: Secondary | ICD-10-CM | POA: Diagnosis not present

## 2017-09-02 DIAGNOSIS — H5203 Hypermetropia, bilateral: Secondary | ICD-10-CM | POA: Diagnosis not present

## 2017-09-02 MED ORDER — SODIUM CHLORIDE 0.9 % IV SOLN
Freq: Once | INTRAVENOUS | Status: AC
  Administered 2017-09-02: 14:00:00 via INTRAVENOUS
  Filled 2017-09-02: qty 1000

## 2017-09-02 MED ORDER — FERUMOXYTOL INJECTION 510 MG/17 ML
510.0000 mg | Freq: Once | INTRAVENOUS | Status: AC
  Administered 2017-09-02: 510 mg via INTRAVENOUS
  Filled 2017-09-02: qty 17

## 2017-09-04 ENCOUNTER — Encounter: Payer: Self-pay | Admitting: Internal Medicine

## 2017-09-04 ENCOUNTER — Ambulatory Visit (INDEPENDENT_AMBULATORY_CARE_PROVIDER_SITE_OTHER): Payer: 59 | Admitting: Internal Medicine

## 2017-09-04 VITALS — BP 122/78 | HR 104 | Temp 98.2°F | Ht 66.0 in | Wt 164.0 lb

## 2017-09-04 DIAGNOSIS — B009 Herpesviral infection, unspecified: Secondary | ICD-10-CM

## 2017-09-04 DIAGNOSIS — J040 Acute laryngitis: Secondary | ICD-10-CM | POA: Diagnosis not present

## 2017-09-04 MED ORDER — VALACYCLOVIR HCL 1 G PO TABS: 1000.0000 mg | ORAL_TABLET | Freq: Two times a day (BID) | ORAL | 0 refills | Status: DC

## 2017-09-04 NOTE — Patient Instructions (Signed)
Take Valtrex 2000 mg twice a day  Take Advil or Tylenol as needed for sore throat, headache, etc

## 2017-09-04 NOTE — Progress Notes (Signed)
Date:  09/04/2017   Name:  Sarah Edwards   DOB:  01/26/1991   MRN:  [id number]   Chief Complaint: Sore Throat (Sore throat, voice is completely gone. Low grade fever- Sunday with body aches. Tried over the counter meds. ) and fever blisters (Was told from oncolgy may be allergic reaction to infusion from Monday. )  Sore Throat   This is a new problem. The current episode started in the past 7 days. The problem has been unchanged. Neither side of throat is experiencing more pain than the other. There has been no fever. The pain is moderate. Associated symptoms include a hoarse voice and trouble swallowing. Pertinent negatives include no shortness of breath. She has had no exposure to strep or mono. She has tried nothing for the symptoms.   Blisters on lips - has has one other episode of fever blisters in the past.  These came over the past few days.  Oncology thought maybe it was a reaction to iron infusion.   Review of Systems  Constitutional: Negative for chills, fatigue and fever.  HENT: Positive for hoarse voice, trouble swallowing and voice change.   Respiratory: Negative for chest tightness, shortness of breath and wheezing.   Cardiovascular: Negative for chest pain and palpitations.  Skin: Positive for rash.  Hematological: Negative for adenopathy.    Patient Active Problem List   Diagnosis Date Noted  . Postpartum hemorrhage 04/18/2017  . Encounter for induction of labor 04/17/2017  . Anemia during pregnancy in third trimester 03/21/2017  . Hypotension 09/24/2016  . Pregnancy 09/23/2016  . Anemia 09/11/2016  . Acid reflux 01/12/2016  . Episodic mood disorder ([place]) 01/12/2016  . Crohn disease ([place]) 01/07/2015  . Endometriosis 01/07/2015  . Tobacco user 01/07/2015  . Anxiety 01/07/2015    Prior to Admission medications   Medication Sig Start Date End Date Taking? Authorizing Provider  levonorgestrel (MIRENA) 20 MCG/24HR IUD 1 each by Intrauterine route once.   Yes  [provider]    Allergies  Allergen Reactions  . Azithromycin Swelling  . Amoxicillin Rash  . Penicillins Rash    Childhood reaction  . Sulfa Antibiotics Nausea And Vomiting    Constant vomiting    Past Surgical History:  Procedure Laterality Date  . COLONOSCOPY N/A 2010 and 2011   crohns  . ESOPHAGOGASTRODUODENOSCOPY ENDOSCOPY N/A 2010  . laproscopy  2012    Social History   Tobacco Use  . Smoking status: Former Smoker    Packs/day: 0.50    Years: 3.00    Pack years: 1.50    Types: Cigarettes    Last attempt to quit: 03/18/2015    Years since quitting: 2.4  . Smokeless tobacco: Never Used  Substance Use Topics  . Alcohol use: No    Alcohol/week: 0.0 oz    Comment: occas - prior to pregnancy.  . Drug use: No     Medication list has been reviewed and updated.  PHQ 2/9 Scores 09/04/2017  PHQ - 2 Score 0    Physical Exam  Constitutional: She is oriented to person, place, and time. She appears well-developed. No distress.  HENT:  Head: Normocephalic and atraumatic.  Nose: Right sinus exhibits no maxillary sinus tenderness. Left sinus exhibits no maxillary sinus tenderness.  Mouth/Throat: No posterior oropharyngeal edema or posterior oropharyngeal erythema.    Neck: Normal range of motion. Neck supple. No thyromegaly present.  Cardiovascular: Normal rate, regular rhythm and normal heart sounds.  Pulmonary/Chest: Effort normal and  breath sounds normal. No respiratory distress. She has no wheezes.  Musculoskeletal: Normal range of motion.  Neurological: She is alert and oriented to person, place, and time.  Skin: Skin is warm and dry. No rash noted.  Psychiatric: She has a normal mood and affect. Her behavior is normal. Thought content normal.  Nursing note and vitals reviewed.   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  Dispense: 20 tablet; Refill: 0  2. Laryngitis, acute advil or tylenol plus liquids as needed   Meds ordered this encounter  Medications  . valACYclovir (VALTREX) 1000 MG tablet    Sig: Take 1 tablet (1,000 mg total) by mouth 2 (two) times daily.    Dispense:  20 tablet    Refill:  0    Partially dictated using Editor, commissioning. Any errors are unintentional.  Halina Maidens, MD [facility]  09/04/2017

## 2017-09-12 ENCOUNTER — Inpatient Hospital Stay: Payer: 59

## 2017-09-12 VITALS — BP 117/72 | HR 89 | Temp 98.7°F | Resp 18

## 2017-09-12 DIAGNOSIS — K589 Irritable bowel syndrome without diarrhea: Secondary | ICD-10-CM | POA: Diagnosis not present

## 2017-09-12 DIAGNOSIS — Z87891 Personal history of nicotine dependence: Secondary | ICD-10-CM | POA: Diagnosis not present

## 2017-09-12 DIAGNOSIS — Z79899 Other long term (current) drug therapy: Secondary | ICD-10-CM | POA: Diagnosis not present

## 2017-09-12 DIAGNOSIS — K5090 Crohn's disease, unspecified, without complications: Secondary | ICD-10-CM | POA: Diagnosis not present

## 2017-09-12 DIAGNOSIS — F419 Anxiety disorder, unspecified: Secondary | ICD-10-CM | POA: Diagnosis not present

## 2017-09-12 DIAGNOSIS — D509 Iron deficiency anemia, unspecified: Secondary | ICD-10-CM | POA: Diagnosis not present

## 2017-09-12 DIAGNOSIS — E538 Deficiency of other specified B group vitamins: Secondary | ICD-10-CM | POA: Diagnosis not present

## 2017-09-12 DIAGNOSIS — O99013 Anemia complicating pregnancy, third trimester: Secondary | ICD-10-CM

## 2017-09-12 MED ORDER — SODIUM CHLORIDE 0.9 % IV SOLN
510.0000 mg | Freq: Once | INTRAVENOUS | Status: AC
  Administered 2017-09-12: 510 mg via INTRAVENOUS
  Filled 2017-09-12: qty 17

## 2017-09-12 MED ORDER — SODIUM CHLORIDE 0.9 % IV SOLN
Freq: Once | INTRAVENOUS | Status: AC
Start: 2017-09-12 — End: 2017-09-12
  Administered 2017-09-12: 14:00:00 via INTRAVENOUS
  Filled 2017-09-12: qty 1000

## 2017-10-16 ENCOUNTER — Telehealth: Payer: Self-pay | Admitting: Oncology

## 2017-10-16 NOTE — Telephone Encounter (Signed)
[facility] Lab/MD/B 12 Inj, per MD on PAL. Rschd and conf updated appt with patient.  MF

## 2017-11-11 ENCOUNTER — Inpatient Hospital Stay: Payer: Self-pay | Attending: Oncology

## 2017-11-12 ENCOUNTER — Other Ambulatory Visit: Payer: 59

## 2017-11-22 ENCOUNTER — Telehealth: Payer: Self-pay | Admitting: *Deleted

## 2017-11-22 NOTE — Telephone Encounter (Signed)
-----   Message from Festus Holts sent at 11/22/2017  2:33 PM EST ----- Regarding: Knee/Thigh Pain/Swelling Patient called to reschedule her [facility] appt and stated that she has been experiencing Knee, Thigh and Leg pain with Swelling in her ankles and hands for the past 2 to 3 weeks.  Patient would like to speak with Nurse or M.D.  Thanks, Sarah Edwards

## 2017-11-22 NOTE — Telephone Encounter (Signed)
Called pt and let her know that the sx she is having could happen 1-2 days after iron treatment but not that far out. She asked would it happen if levels are low and I told her no that it would not happen.  She states that it is mostly pain on one side but sometimes can be other side and swelling also.  She describes it like arthritis pain and she has tried tylenol, ibuprofen, icy hot patches, tiger balm, aleve, heating pads and nothing helps. I told her to start with PCP and depending on what she thought she may have to see ortho or neuro and she has seen both types of specialist in past. She will call [facility] office to get appt

## 2017-11-29 ENCOUNTER — Inpatient Hospital Stay: Payer: Self-pay | Attending: Oncology

## 2017-11-29 ENCOUNTER — Inpatient Hospital Stay: Payer: Self-pay

## 2017-11-29 DIAGNOSIS — K589 Irritable bowel syndrome without diarrhea: Secondary | ICD-10-CM | POA: Insufficient documentation

## 2017-11-29 DIAGNOSIS — E538 Deficiency of other specified B group vitamins: Secondary | ICD-10-CM | POA: Insufficient documentation

## 2017-11-29 DIAGNOSIS — D509 Iron deficiency anemia, unspecified: Secondary | ICD-10-CM | POA: Insufficient documentation

## 2017-11-29 DIAGNOSIS — Z87891 Personal history of nicotine dependence: Secondary | ICD-10-CM | POA: Insufficient documentation

## 2017-11-29 DIAGNOSIS — K5090 Crohn's disease, unspecified, without complications: Secondary | ICD-10-CM | POA: Insufficient documentation

## 2017-11-29 DIAGNOSIS — F419 Anxiety disorder, unspecified: Secondary | ICD-10-CM | POA: Insufficient documentation

## 2017-11-29 DIAGNOSIS — Z79899 Other long term (current) drug therapy: Secondary | ICD-10-CM | POA: Insufficient documentation

## 2017-11-29 LAB — CBC WITH DIFFERENTIAL/PLATELET
Basophils Absolute: 0 10*3/uL (ref 0–0.1)
Basophils Relative: 0 %
EOS PCT: 1 %
Eosinophils Absolute: 0.1 10*3/uL (ref 0–0.7)
HCT: 41.7 % (ref 35.0–47.0)
Hemoglobin: 14.5 g/dL (ref 12.0–16.0)
LYMPHS PCT: 19 %
Lymphs Abs: 1.5 10*3/uL (ref 1.0–3.6)
MCH: 30.8 pg (ref 26.0–34.0)
MCHC: 34.7 g/dL (ref 32.0–36.0)
MCV: 88.7 fL (ref 80.0–100.0)
MONO ABS: 0.4 10*3/uL (ref 0.2–0.9)
Monocytes Relative: 5 %
Neutro Abs: 5.6 10*3/uL (ref 1.4–6.5)
Neutrophils Relative %: 75 %
PLATELETS: 248 10*3/uL (ref 150–440)
RBC: 4.7 MIL/uL (ref 3.80–5.20)
RDW: 13.7 % (ref 11.5–14.5)
WBC: 7.5 10*3/uL (ref 3.6–11.0)

## 2017-11-29 LAB — IRON AND TIBC
IRON: 70 ug/dL (ref 28–170)
Saturation Ratios: 25 % (ref 10.4–31.8)
TIBC: 280 ug/dL (ref 250–450)
UIBC: 210 ug/dL

## 2017-11-29 LAB — FERRITIN: Ferritin: 152 ng/mL (ref 11–307)

## 2017-12-02 ENCOUNTER — Other Ambulatory Visit: Payer: Self-pay | Admitting: *Deleted

## 2018-02-11 ENCOUNTER — Ambulatory Visit: Payer: 59 | Admitting: Oncology

## 2018-02-11 ENCOUNTER — Ambulatory Visit: Payer: 59

## 2018-02-11 ENCOUNTER — Other Ambulatory Visit: Payer: 59

## 2018-02-20 ENCOUNTER — Inpatient Hospital Stay: Payer: 59 | Admitting: Oncology

## 2018-02-20 ENCOUNTER — Inpatient Hospital Stay: Payer: 59

## 2018-02-20 ENCOUNTER — Inpatient Hospital Stay: Payer: 59 | Attending: Oncology

## 2018-02-20 DIAGNOSIS — E538 Deficiency of other specified B group vitamins: Secondary | ICD-10-CM

## 2018-02-20 DIAGNOSIS — D509 Iron deficiency anemia, unspecified: Secondary | ICD-10-CM | POA: Diagnosis present

## 2018-02-20 LAB — CBC WITH DIFFERENTIAL/PLATELET
BASOS ABS: 0 10*3/uL (ref 0–0.1)
Basophils Relative: 0 %
Eosinophils Absolute: 0.1 10*3/uL (ref 0–0.7)
Eosinophils Relative: 1 %
HCT: 41.3 % (ref 35.0–47.0)
Hemoglobin: 14.5 g/dL (ref 12.0–16.0)
LYMPHS PCT: 24 %
Lymphs Abs: 1.8 10*3/uL (ref 1.0–3.6)
MCH: 31.4 pg (ref 26.0–34.0)
MCHC: 35 g/dL (ref 32.0–36.0)
MCV: 89.7 fL (ref 80.0–100.0)
Monocytes Absolute: 0.3 10*3/uL (ref 0.2–0.9)
Monocytes Relative: 5 %
NEUTROS ABS: 5.2 10*3/uL (ref 1.4–6.5)
Neutrophils Relative %: 70 %
Platelets: 237 10*3/uL (ref 150–440)
RBC: 4.6 MIL/uL (ref 3.80–5.20)
RDW: 12.1 % (ref 11.5–14.5)
WBC: 7.4 10*3/uL (ref 3.6–11.0)

## 2018-02-20 LAB — IRON AND TIBC
IRON: 75 ug/dL (ref 28–170)
Saturation Ratios: 25 % (ref 10.4–31.8)
TIBC: 296 ug/dL (ref 250–450)
UIBC: 221 ug/dL

## 2018-02-20 LAB — FERRITIN: FERRITIN: 152 ng/mL (ref 11–307)

## 2018-02-20 LAB — VITAMIN B12: Vitamin B-12: 361 pg/mL (ref 180–914)

## 2018-02-21 ENCOUNTER — Telehealth: Payer: Self-pay | Admitting: *Deleted

## 2018-02-21 NOTE — Telephone Encounter (Signed)
Called patient with results and Dr. Janese Banks states she feels that her anemia was from when she was pregnant and she probably don't need to come anymore.  If she wants to come 1 more time and have [place] check labs then we can  Make her appt 6 months from now. She wants to come back in 6 months . Dr. Janese Banks states that we will stop the b12 shot and for her to take 1000 mcg b12 daily and I will make her appt for 6 months to see md with labs. She will look on my chart and call me if she needs me.

## 2018-02-24 ENCOUNTER — Inpatient Hospital Stay: Payer: 59 | Admitting: Oncology

## 2018-02-24 ENCOUNTER — Inpatient Hospital Stay: Payer: 59

## 2018-08-19 ENCOUNTER — Ambulatory Visit: Payer: 59 | Admitting: Internal Medicine

## 2018-08-19 ENCOUNTER — Encounter: Payer: Self-pay | Admitting: Internal Medicine

## 2018-08-19 ENCOUNTER — Telehealth: Payer: Self-pay | Admitting: Internal Medicine

## 2018-08-19 ENCOUNTER — Other Ambulatory Visit: Payer: Self-pay

## 2018-08-19 VITALS — BP 130/80 | HR 89 | Temp 98.1°F | Ht 66.0 in | Wt 171.0 lb

## 2018-08-19 DIAGNOSIS — B009 Herpesviral infection, unspecified: Secondary | ICD-10-CM

## 2018-08-19 DIAGNOSIS — K625 Hemorrhage of anus and rectum: Secondary | ICD-10-CM | POA: Diagnosis not present

## 2018-08-19 DIAGNOSIS — D509 Iron deficiency anemia, unspecified: Secondary | ICD-10-CM

## 2018-08-19 DIAGNOSIS — R1011 Right upper quadrant pain: Secondary | ICD-10-CM | POA: Insufficient documentation

## 2018-08-19 HISTORY — DX: Hemorrhage of anus and rectum: K62.5

## 2018-08-19 MED ORDER — VALACYCLOVIR HCL 1 G PO TABS: 1000.0000 mg | ORAL_TABLET | Freq: Two times a day (BID) | ORAL | 0 refills | Status: DC | PRN

## 2018-08-19 NOTE — Progress Notes (Signed)
Date:  08/19/2018   Name:  Sarah Edwards   DOB:  [birth year]   MRN:  [id number]   Chief Complaint: Blood In Stools (Yesterday had a loose BM. When she wiped noticed a "silver dollar" sized amount of bright red blood on tissue paper. This happened twice and had BM today which appeared to be normal and only a spec of blood was on the tissue today.  Pain yesterday caused her to be buckled over. Today is better. Pain is still sore. ); Abdominal Pain (Pain is located on ride side under right breastline. Pain is currently at a 5. It is dull and aggervating. ); and Mouth Lesions (Wants to know if can have refill for valtrex for as needed cold sore. )  Abdominal Pain  This is a new problem. The current episode started in the past 7 days. The onset quality is sudden. The problem occurs daily. The pain is located in the RUQ. The pain is moderate. The quality of the pain is colicky, cramping and sharp. The abdominal pain does not radiate. Associated symptoms include hematochezia and nausea. Pertinent negatives include no arthralgias, constipation, fever or vomiting.  She had the onset of abdominal pain yesterday with the urge to defecate.  This is when she has a small amount of BRB.  The pain eased some and is less today.  She has a small stool today with only a scant smear of red blood. She has a hx of possible Crohns disease dx 10 years ago.  Seen by Dr. Candace Cruise at [facility]. She is being treated by Hematology for anemia with iron infusions.  Review of Systems  Constitutional: Negative for chills, diaphoresis and fever.  Respiratory: Negative for choking and shortness of breath.   Cardiovascular: Negative for chest pain, palpitations and leg swelling.  Gastrointestinal: Positive for abdominal pain, anal bleeding, hematochezia and nausea. Negative for constipation and vomiting.  Musculoskeletal: Negative for arthralgias.  Psychiatric/Behavioral: Negative for sleep disturbance.    Patient Active Problem List   Diagnosis Date Noted  . 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  . Anemia during pregnancy in third trimester 03/21/2017  . Hypotension 09/24/2016  . Anemia 09/11/2016  . Acid reflux 01/12/2016  . Episodic mood disorder ([place]) 01/12/2016  . Crohn disease ([place]) 01/07/2015  . Endometriosis 01/07/2015  . Tobacco user 01/07/2015  . Anxiety 01/07/2015    Allergies  Allergen Reactions  . Azithromycin Swelling  . Amoxicillin Rash  . Penicillins Rash    Childhood reaction  . Sulfa Antibiotics Nausea And Vomiting    Constant vomiting    Past Surgical History:  Procedure Laterality Date  . COLONOSCOPY N/A 2010 and 2011   crohns  . ESOPHAGOGASTRODUODENOSCOPY ENDOSCOPY N/A 2010  . laproscopy  2012    Social History   Tobacco Use  . Smoking status: Former Smoker    Packs/day: 0.50    Years: 3.00    Pack years: 1.50    Types: Cigarettes    Last attempt to quit: 03/18/2015    Years since quitting: 3.4  . Smokeless tobacco: Never Used  Substance Use Topics  . Alcohol use: No    Alcohol/week: 0.0 standard drinks    Comment: occas - prior to pregnancy.  . Drug use: No     Medication list has been reviewed and updated.  Current Meds  Medication Sig  . levonorgestrel (MIRENA) 20 MCG/24HR IUD 1 each by Intrauterine route once.  . valACYclovir (VALTREX) 1000 MG tablet Take 1  tablet (1,000 mg total) by mouth 2 (two) times daily. (Patient taking differently: Take 1,000 mg by mouth 2 (two) times daily as needed. )    PHQ 2/9 Scores 09/04/2017  PHQ - 2 Score 0    Physical Exam  Constitutional: She is oriented to person, place, and time. She appears well-developed. No distress.  HENT:  Head: Normocephalic and atraumatic.  Cardiovascular: Normal rate, regular rhythm and normal heart sounds.  Pulmonary/Chest: Effort normal and breath sounds normal. No respiratory distress.  Abdominal: She exhibits no distension, no fluid wave and no mass. Bowel sounds are decreased.  There is tenderness in the right upper quadrant and epigastric area. There is no rigidity, no guarding, no tenderness at McBurney's point and negative Murphy's sign. No hernia.  Musculoskeletal: Normal range of motion.  Neurological: She is alert and oriented to person, place, and time.  Skin: Skin is warm and dry. No rash noted.  Psychiatric: She has a normal mood and affect. Her behavior is normal. Thought content normal.  Nursing note and vitals reviewed.   BP 130/80 (BP Location: Left Arm, Patient Position: Sitting, Cuff Size: Normal)   Pulse 89   Temp 98.1 F (36.7 C) (Oral)   Ht 5' 6"  (1.676 m)   Wt 171 lb (77.6 kg)   SpO2 98%   BMI 27.60 kg/m   Assessment and Plan: 1. Bright red rectal bleeding Minimal today - continue to monitor and go to ED if markedly worse See GI as planned in 2 days  2. Abdominal pain, RUQ Suspect this is related to the intestinal issues and unlikely to be gall bladder disease with an episode at the same time as the rectal bleeding To go to ED if severe  3. HSV-1 (herpes simplex virus 1) infection - valACYclovir (VALTREX) 1000 MG tablet; Take 1 tablet (1,000 mg total) by mouth 2 (two) times daily as needed.  Dispense: 30 tablet; Refill: 0   Partially dictated using Editor, commissioning. Any errors are unintentional.  Halina Maidens, MD [facility]  08/19/2018

## 2018-08-19 NOTE — Telephone Encounter (Signed)
Patient called wanting the dr berglund to send over a referral to GI, I explained to her that she needed to be seen with an office visit first for the current problem. She also complained of severe rectal bleeding and pain and wanted to be seen today. I mention that if she was having that much pain that she needed to be seen at the emergency room. She did not want to go to the emergency room because of her expense and ask that if we had an appointment sometime this week. There was a slot for this Friday, which she refused because it was too late in the week. She was a little upset and said that she would contact her mom to see if they can find a GI doctor without a referral. She said she would call us back if there are any changes.

## 2018-08-19 NOTE — Telephone Encounter (Signed)
Called and left VM with patient. Awaiting call back to discuss sx more in detail.

## 2018-08-21 ENCOUNTER — Other Ambulatory Visit: Payer: Self-pay

## 2018-08-21 ENCOUNTER — Ambulatory Visit (INDEPENDENT_AMBULATORY_CARE_PROVIDER_SITE_OTHER): Payer: 59 | Admitting: Certified Nurse Midwife

## 2018-08-21 ENCOUNTER — Encounter: Payer: Self-pay | Admitting: Certified Nurse Midwife

## 2018-08-21 ENCOUNTER — Other Ambulatory Visit: Payer: Self-pay | Admitting: Gastroenterology

## 2018-08-21 ENCOUNTER — Inpatient Hospital Stay: Payer: 59 | Attending: Oncology

## 2018-08-21 ENCOUNTER — Inpatient Hospital Stay (HOSPITAL_BASED_OUTPATIENT_CLINIC_OR_DEPARTMENT_OTHER): Payer: 59 | Admitting: Oncology

## 2018-08-21 ENCOUNTER — Other Ambulatory Visit (HOSPITAL_COMMUNITY)
Admission: RE | Admit: 2018-08-21 | Discharge: 2018-08-21 | Disposition: A | Discharge status: Home / Self Care | Payer: 59 | Source: Ambulatory Visit | Attending: Certified Nurse Midwife | Admitting: Certified Nurse Midwife

## 2018-08-21 VITALS — BP 111/75 | HR 75 | Ht 66.0 in | Wt 171.9 lb

## 2018-08-21 VITALS — BP 127/81 | HR 85 | Temp 97.7°F | Resp 16 | Wt 171.6 lb

## 2018-08-21 DIAGNOSIS — E538 Deficiency of other specified B group vitamins: Secondary | ICD-10-CM | POA: Diagnosis present

## 2018-08-21 DIAGNOSIS — Z23 Encounter for immunization: Secondary | ICD-10-CM

## 2018-08-21 DIAGNOSIS — D509 Iron deficiency anemia, unspecified: Secondary | ICD-10-CM

## 2018-08-21 DIAGNOSIS — F419 Anxiety disorder, unspecified: Secondary | ICD-10-CM | POA: Diagnosis not present

## 2018-08-21 DIAGNOSIS — K589 Irritable bowel syndrome without diarrhea: Secondary | ICD-10-CM | POA: Diagnosis not present

## 2018-08-21 DIAGNOSIS — Z124 Encounter for screening for malignant neoplasm of cervix: Secondary | ICD-10-CM | POA: Diagnosis not present

## 2018-08-21 DIAGNOSIS — K5090 Crohn's disease, unspecified, without complications: Secondary | ICD-10-CM

## 2018-08-21 DIAGNOSIS — Z87891 Personal history of nicotine dependence: Secondary | ICD-10-CM | POA: Insufficient documentation

## 2018-08-21 DIAGNOSIS — Z01419 Encounter for gynecological examination (general) (routine) without abnormal findings: Secondary | ICD-10-CM | POA: Diagnosis not present

## 2018-08-21 DIAGNOSIS — Z331 Pregnant state, incidental: Secondary | ICD-10-CM | POA: Diagnosis not present

## 2018-08-21 DIAGNOSIS — R1011 Right upper quadrant pain: Secondary | ICD-10-CM | POA: Diagnosis not present

## 2018-08-21 DIAGNOSIS — T8384XA Pain from genitourinary prosthetic devices, implants and grafts, initial encounter: Secondary | ICD-10-CM

## 2018-08-21 DIAGNOSIS — K625 Hemorrhage of anus and rectum: Secondary | ICD-10-CM | POA: Diagnosis not present

## 2018-08-21 LAB — CBC WITH DIFFERENTIAL/PLATELET
Abs Immature Granulocytes: 0.02 10*3/uL (ref 0.00–0.07)
Basophils Absolute: 0 10*3/uL (ref 0.0–0.1)
Basophils Relative: 0 %
Eosinophils Absolute: 0.1 10*3/uL (ref 0.0–0.5)
Eosinophils Relative: 1 %
HCT: 42.9 % (ref 36.0–46.0)
HEMOGLOBIN: 14.1 g/dL (ref 12.0–15.0)
Immature Granulocytes: 0 %
Lymphocytes Relative: 26 %
Lymphs Abs: 1.8 10*3/uL (ref 0.7–4.0)
MCH: 29.2 pg (ref 26.0–34.0)
MCHC: 32.9 g/dL (ref 30.0–36.0)
MCV: 88.8 fL (ref 80.0–100.0)
Monocytes Absolute: 0.3 10*3/uL (ref 0.1–1.0)
Monocytes Relative: 5 %
Neutro Abs: 4.8 10*3/uL (ref 1.7–7.7)
Neutrophils Relative %: 68 %
Platelets: 239 10*3/uL (ref 150–400)
RBC: 4.83 MIL/uL (ref 3.87–5.11)
RDW: 11.4 % — ABNORMAL LOW (ref 11.5–15.5)
WBC: 7 10*3/uL (ref 4.0–10.5)
nRBC: 0 % (ref 0.0–0.2)

## 2018-08-21 LAB — VITAMIN B12: VITAMIN B 12: 260 pg/mL (ref 180–914)

## 2018-08-21 LAB — IRON AND TIBC
Iron: 88 ug/dL (ref 28–170)
SATURATION RATIOS: 29 % (ref 10.4–31.8)
TIBC: 305 ug/dL (ref 250–450)
UIBC: 217 ug/dL

## 2018-08-21 LAB — COMPREHENSIVE METABOLIC PANEL
ALT: 25 U/L (ref 0–44)
AST: 23 U/L (ref 15–41)
Albumin: 4.8 g/dL (ref 3.5–5.0)
Alkaline Phosphatase: 84 U/L (ref 38–126)
Anion gap: 11 (ref 5–15)
BUN: 12 mg/dL (ref 6–20)
CHLORIDE: 102 mmol/L (ref 98–111)
CO2: 27 mmol/L (ref 22–32)
CREATININE: 0.67 mg/dL (ref 0.44–1.00)
Calcium: 9.5 mg/dL (ref 8.9–10.3)
GFR calc Af Amer: >60 mL/min (ref >60)
Glucose, Bld: 110 mg/dL — ABNORMAL HIGH (ref 70–99)
Potassium: 3.5 mmol/L (ref 3.5–5.1)
Sodium: 140 mmol/L (ref 135–145)
Total Bilirubin: 0.6 mg/dL (ref 0.3–1.2)
Total Protein: 8.5 g/dL — ABNORMAL HIGH (ref 6.5–8.1)

## 2018-08-21 LAB — C-REACTIVE PROTEIN: CRP: <0.8 mg/dL (ref <1.0)

## 2018-08-21 LAB — LIPASE, BLOOD: Lipase: 47 U/L (ref 11–51)

## 2018-08-21 LAB — FERRITIN: Ferritin: 119 ng/mL (ref 11–307)

## 2018-08-21 LAB — SEDIMENTATION RATE: Sed Rate: 7 mm/hr (ref 0–20)

## 2018-08-21 NOTE — Progress Notes (Signed)
Patient here annual exam.  Patient c/o feeling IUD "move" during intercourse.

## 2018-08-21 NOTE — Progress Notes (Signed)
Here for follow up. Per pt energy level is fair "I have good and bad days" denies sob walking from lab to clinic  -stated when doing laundry at home and busy-does get sob  Pulse ox 100% on RA .  Per pt had " gallbladder attack " saw PCP /and GI -and for ultrasound of gall bladder next week. . Asymptomatic at this time.

## 2018-08-21 NOTE — Progress Notes (Signed)
Hematology/Oncology Consult note [facility]  [phone] Fax:([phone]) [phone]  Patient Care Team: Glean Hess, MD as PCP - General (Internal Medicine)   Name of the patient: Sarah Edwards  [id number]  [birth year]   Date of visit: 08/21/18  Diagnosis- B12 and iron deficiency anemia  Chief complaint/ Reason for visit- routine f/u of b12 and iron deficiency anemia  Heme/Onc history: patient is a 27 year old female who was referred to [place] for anemiaof pregnancy. CBC from 09/24/2016 showed white count of 7.1, H&H of 10.2/30.6 with an MCV of 80.6 and a platelet count of 196. Her H&H over the last 6 months has been between 10-11. On 03/05/2017 her H&H was 8.8/29.3. B12 levels were low normal at 253 and folate level was normal. This is her second pregnancy and her first pregnancy was about 5 years ago. She did not have any complications during that pregnancy. Within normal vaginal delivery although she says that she was anemic at that time which did get better on its own. Also reports that she had anemia outside of her pregnancy is well. She has been trying to eat iron rich foods as well as take oral iron and liquid iron but she has not been able to tolerate iron supplements due to nausea. Denies any blood loss in her urine or stool. No family history of anemia.  CBC on 03/21/2017 showed white count of 6.4, H&H of 8.7/26.8 with an MCV of 70.5 and a platelet count of 181. CMP showed low potassium of 2.9. Ferritin was low at 14. Iron studies showed low iron saturation of 3% and low serum iron of 20. ESR was elevated at 70. TSH was normal. Multiple myeloma panel did not reveal any monoclonal protein. Haptoglobin was normal. Reticulocyte count was low normal at 1.7%.  Pregnancy was complicated by retained placenta that was removed without surgical intervention but she did have signifcant blood loss at that time requiring blood transfusion  Interval history-she  reports having right upper quadrant abdominal pain for the last 5 days which sometimes radiates to her right shoulder.  She also reports some nausea when she eats her food.  She was seen by [facility] GI and will be getting an ultrasound of her right upper quadrant next week.  Other than that she feels well.  She had one episode of mild bleeding in her stool about 1 week ago which has not recurred since then  ECOG PS- 0 Pain scale- 0 Opioid associated constipation- no  Review of systems- Review of Systems  Constitutional: Negative for chills, fever, malaise/fatigue and weight loss.  HENT: Negative for congestion, ear discharge and nosebleeds.   Eyes: Negative for blurred vision.  Respiratory: Negative for cough, hemoptysis, sputum production, shortness of breath and wheezing.   Cardiovascular: Negative for chest pain, palpitations, orthopnea and claudication.  Gastrointestinal: Negative for abdominal pain, blood in stool, constipation, diarrhea, heartburn, melena, nausea and vomiting.  Genitourinary: Negative for dysuria, flank pain, frequency, hematuria and urgency.  Musculoskeletal: Negative for back pain, joint pain and myalgias.  Skin: Negative for rash.  Neurological: Negative for dizziness, tingling, focal weakness, seizures, weakness and headaches.  Endo/Heme/Allergies: Does not bruise/bleed easily.  Psychiatric/Behavioral: Negative for depression and suicidal ideas. The patient does not have insomnia.       Allergies  Allergen Reactions  . Azithromycin Swelling  . Amoxicillin Rash  . Penicillins Rash    Childhood reaction  . Sulfa Antibiotics Nausea And Vomiting    Constant  vomiting     Past Medical History:  Diagnosis Date  . Abnormal uterine bleeding (AUB)   . Anemia   . Anemia during pregnancy in third trimester 03/21/2017  . Anxiety   . Chronic UTI   . Crohn disease ([place])   . Endometriosis   . IBS (irritable bowel syndrome)   . Painful menstrual periods   .  SAB (spontaneous abortion) 01/06/2014     Past Surgical History:  Procedure Laterality Date  . COLONOSCOPY N/A 2010 and 2011   crohns  . ESOPHAGOGASTRODUODENOSCOPY ENDOSCOPY N/A 2010  . laproscopy  2012    Social History   Socioeconomic History  . Marital status: Single    Spouse name: Not on file  . Number of children: Not on file  . Years of education: Not on file  . 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 Financial resource strain: Not on file  . Food insecurity:    Worry: Not on file    Inability: Not on file  . Transportation needs:    Medical: Not on file    Non-medical: Not on file  Tobacco Use  . Smoking status: Former Smoker    Packs/day: 0.50    Years: 3.00    Pack years: 1.50    Types: Cigarettes    Last attempt to quit: 03/18/2015    Years since quitting: 3.4  . Smokeless tobacco: Never Used  Substance and Sexual Activity  . Alcohol use: No    Alcohol/week: 0.0 standard drinks    Comment: occas - prior to pregnancy.  . Drug use: No  . Sexual activity: Yes    Birth control/protection: None  Lifestyle  . Physical activity:    Days per week: Not on file    Minutes per session: Not on file  . Stress: Not on file  Relationships  . Social connections:    Talks on phone: Not on file    Gets together: Not on file    Attends religious service: Not on file    Active member of club or organization: Not on file    Attends meetings of clubs or organizations: Not on file    Relationship status: Not on file  . Intimate partner violence:    Fear of current or ex partner: Not on file    Emotionally abused: Not on file    Physically abused: Not on file    Forced sexual activity: Not on file  Other Topics Concern  . Not on file  Social History Narrative   Lives in [place] with husband and dtr.  Does not routinely exercise.    Family History  Problem Relation Age of Onset  .  Cancer Paternal Grandmother        breast   . Ovarian cancer Paternal Grandmother   . Cervical cancer Paternal Grandmother   . Lung cancer Paternal Grandfather   . Leukemia Maternal Grandfather   . Cancer Maternal Grandfather   . Diabetes Mother   . Diabetes Father   . Cancer Paternal Aunt      Current Outpatient Medications:  .  levonorgestrel (MIRENA) 20 MCG/24HR IUD, 1 each by Intrauterine route once., Disp: , Rfl:  .  valACYclovir (VALTREX) 1000 MG tablet, Take 1 tablet (1,000 mg total) by mouth 2 (two) times daily as needed., Disp: 30 tablet, Rfl: 0  Physical exam:  Vitals:   08/21/18 1403  BP:  127/81  Pulse: 85  Resp: 16  Temp: 97.7 F (36.5 C)  TempSrc: Tympanic  Weight: 171 lb 9.6 oz (77.8 kg)   Physical Exam  Constitutional: She is oriented to person, place, and time.  HENT:  Head: Normocephalic and atraumatic.  Eyes: Pupils are equal, round, and reactive to light. EOM are normal.  Neck: Normal range of motion.  Cardiovascular: Normal rate, regular rhythm and normal heart sounds.  Pulmonary/Chest: Effort normal and breath sounds normal.  Abdominal: Soft. Bowel sounds are normal.  Neurological: She is alert and oriented to person, place, and time.  Skin: Skin is warm and dry.     CMP Latest Ref Rng & Units 03/28/2017  Glucose 65 - 99 mg/dL 110(H)  BUN 6 - 20 mg/dL 10  Creatinine 0.44 - 1.00 mg/dL 0.52  Sodium 135 - 145 mmol/L 135  Potassium 3.5 - 5.1 mmol/L 3.8  Chloride 101 - 111 mmol/L 104  CO2 22 - 32 mmol/L 23  Calcium 8.9 - 10.3 mg/dL 8.8(L)  Total Protein 6.5 - 8.1 g/dL -  Total Bilirubin 0.3 - 1.2 mg/dL -  Alkaline Phos 38 - 126 U/L -  AST 15 - 41 U/L -  ALT 14 - 54 U/L -   CBC Latest Ref Rng & Units 02/20/2018  WBC 3.6 - 11.0 K/uL 7.4  Hemoglobin 12.0 - 16.0 g/dL 14.5  Hematocrit 35.0 - 47.0 % 41.3  Platelets 150 - 440 K/uL 237      Assessment and plan- Patient is a 27 y.o. female with iron and B12 deficiency anemia  Patient has not  required IV iron for the last 1 year and her iron deficiency was likely in the context of her pregnancy.  Her hemoglobin is now stable around 14 for the last 1 year.  She therefore does not require any hematology follow-up at this time.  Her last B12 levels were normal.  Her iron studies from today are pending.  She can continue to follow-up with Dr. Army Melia at this time  With regards to her right upper quadrant pain: GI had requested getting CMP, C-reactive protein ESR and lipase which we have ordered and will forward the results to them.  Further work-up by GI   Visit Diagnosis 1. Iron deficiency anemia, unspecified iron deficiency anemia type      Dr. Randa Evens, MD, MPH [facility] [phone] 08/21/2018 1:34 PM

## 2018-08-21 NOTE — Patient Instructions (Signed)
Levonorgestrel intrauterine device (IUD) What is this medicine? LEVONORGESTREL IUD (LEE voe nor jes trel) is a contraceptive (birth control) device. The device is placed inside the uterus by a healthcare professional. It is used to prevent pregnancy. This device can also be used to treat heavy bleeding that occurs during your period. This medicine may be used for other purposes; ask your health care provider or pharmacist if you have questions. COMMON BRAND NAME(S): Minette Headland What should I tell my health care provider before I take this medicine? They need to know if you have any of these conditions: -abnormal Pap smear -cancer of the breast, uterus, or cervix -diabetes -endometritis -genital or pelvic infection now or in the past -have more than one sexual partner or your partner has more than one partner -heart disease -history of an ectopic or tubal pregnancy -immune system problems -IUD in place -liver disease or tumor -problems with blood clots or take blood-thinners -seizures -use intravenous drugs -uterus of unusual shape -vaginal bleeding that has not been explained -an unusual or allergic reaction to levonorgestrel, other hormones, silicone, or polyethylene, medicines, foods, dyes, or preservatives -pregnant or trying to get pregnant -breast-feeding How should I use this medicine? This device is placed inside the uterus by a health care professional. Talk to your pediatrician regarding the use of this medicine in children. Special care may be needed. Overdosage: If you think you have taken too much of this medicine contact a poison control center or emergency room at once. NOTE: This medicine is only for you. Do not share this medicine with others. What if I miss a dose? This does not apply. Depending on the brand of device you have inserted, the device will need to be replaced every 3 to 5 years if you wish to continue using this type of birth  control. What may interact with this medicine? Do not take this medicine with any of the following medications: -amprenavir -bosentan -fosamprenavir This medicine may also interact with the following medications: -aprepitant -armodafinil -barbiturate medicines for inducing sleep or treating seizures -bexarotene -boceprevir -griseofulvin -medicines to treat seizures like carbamazepine, ethotoin, felbamate, oxcarbazepine, phenytoin, topiramate -modafinil -pioglitazone -rifabutin -rifampin -rifapentine -some medicines to treat HIV infection like atazanavir, efavirenz, indinavir, lopinavir, nelfinavir, tipranavir, ritonavir -St. John's wort -warfarin This list may not describe all possible interactions. Give your health care provider a list of all the medicines, herbs, non-prescription drugs, or dietary supplements you use. Also tell them if you smoke, drink alcohol, or use illegal drugs. Some items may interact with your medicine. What should I watch for while using this medicine? Visit your doctor or health care professional for regular check ups. See your doctor if you or your partner has sexual contact with others, becomes HIV positive, or gets a sexual transmitted disease. This product does not protect you against HIV infection (AIDS) or other sexually transmitted diseases. You can check the placement of the IUD yourself by reaching up to the top of your vagina with clean fingers to feel the threads. Do not pull on the threads. It is a good habit to check placement after each menstrual period. Call your doctor right away if you feel more of the IUD than just the threads or if you cannot feel the threads at all. The IUD may come out by itself. You may become pregnant if the device comes out. If you notice that the IUD has come out use a backup birth control method like condoms and call your  health care provider. Using tampons will not change the position of the IUD and are okay to use  during your period. This IUD can be safely scanned with magnetic resonance imaging (MRI) only under specific conditions. Before you have an MRI, tell your healthcare provider that you have an IUD in place, and which type of IUD you have in place. What side effects may I notice from receiving this medicine? Side effects that you should report to your doctor or health care professional as soon as possible: -allergic reactions like skin rash, itching or hives, swelling of the face, lips, or tongue -fever, flu-like symptoms -genital sores -high blood pressure -no menstrual period for 6 weeks during use -pain, swelling, warmth in the leg -pelvic pain or tenderness -severe or sudden headache -signs of pregnancy -stomach cramping -sudden shortness of breath -trouble with balance, talking, or walking -unusual vaginal bleeding, discharge -yellowing of the eyes or skin Side effects that usually do not require medical attention (report to your doctor or health care professional if they continue or are bothersome): -acne -breast pain -change in sex drive or performance -changes in weight -cramping, dizziness, or faintness while the device is being inserted -headache -irregular menstrual bleeding within first 3 to 6 months of use -nausea This list may not describe all possible side effects. Call your doctor for medical advice about side effects. You may report side effects to FDA at 1-800-FDA-1088. Where should I keep my medicine? This does not apply. NOTE: This sheet is a summary. It may not cover all possible information. If you have questions about this medicine, talk to your doctor, pharmacist, or health care provider.  2018 Elsevier/Gold Standard (2016-06-15 14:14:56) Preventive Care 18-39 Years, Female Preventive care refers to lifestyle choices and visits with your health care provider that can promote health and wellness. What does preventive care include?  A yearly physical exam.  This is also called an annual well check.  Dental exams once or twice a year.  Routine eye exams. Ask your health care provider how often you should have your eyes checked.  Personal lifestyle choices, including: ? Daily care of your teeth and gums. ? Regular physical activity. ? Eating a healthy diet. ? Avoiding tobacco and drug use. ? Limiting alcohol use. ? Practicing safe sex. ? Taking vitamin and mineral supplements as recommended by your health care provider. What happens during an annual well check? The services and screenings done by your health care provider during your annual well check will depend on your age, overall health, lifestyle risk factors, and family history of disease. Counseling Your health care provider may ask you questions about your:  Alcohol use.  Tobacco use.  Drug use.  Emotional well-being.  Home and relationship well-being.  Sexual activity.  Eating habits.  Work and work Statistician.  Method of birth control.  Menstrual cycle.  Pregnancy history.  Screening You may have the following tests or measurements:  Height, weight, and BMI.  Diabetes screening. This is done by checking your blood sugar (glucose) after you have not eaten for a while (fasting).  Blood pressure.  Lipid and cholesterol levels. These may be checked every 5 years starting at age 83.  Skin check.  Hepatitis C blood test.  Hepatitis B blood test.  Sexually transmitted disease (STD) testing.  BRCA-related cancer screening. This may be done if you have a family history of breast, ovarian, tubal, or peritoneal cancers.  Pelvic exam and Pap test. This may be done every 3  years starting at age 14. Starting at age 51, this may be done every 5 years if you have a Pap test in combination with an HPV test.  Discuss your test results, treatment options, and if necessary, the need for more tests with your health care provider. Vaccines Your health care provider  may recommend certain vaccines, such as:  Influenza vaccine. This is recommended every year.  Tetanus, diphtheria, and acellular pertussis (Tdap, Td) vaccine. You may need a Td booster every 10 years.  Varicella vaccine. You may need this if you have not been vaccinated.  HPV vaccine. If you are 67 or younger, you may need three doses over 6 months.  Measles, mumps, and rubella (MMR) vaccine. You may need at least one dose of MMR. You may also need a second dose.  Pneumococcal 13-valent conjugate (PCV13) vaccine. You may need this if you have certain conditions and were not previously vaccinated.  Pneumococcal polysaccharide (PPSV23) vaccine. You may need one or two doses if you smoke cigarettes or if you have certain conditions.  Meningococcal vaccine. One dose is recommended if you are age 14-21 years and a first-year college student living in a residence hall, or if you have one of several medical conditions. You may also need additional booster doses.  Hepatitis A vaccine. You may need this if you have certain conditions or if you travel or work in places where you may be exposed to hepatitis A.  Hepatitis B vaccine. You may need this if you have certain conditions or if you travel or work in places where you may be exposed to hepatitis B.  Haemophilus influenzae type b (Hib) vaccine. You may need this if you have certain risk factors.  Talk to your health care provider about which screenings and vaccines you need and how often you need them. This information is not intended to replace advice given to you by your health care provider. Make sure you discuss any questions you have with your health care provider. Document Released: 10/30/2001 Document Revised: 05/23/2016 Document Reviewed: 07/05/2015 Elsevier Interactive Patient Education  [employer].

## 2018-08-22 ENCOUNTER — Encounter: Payer: Self-pay | Admitting: Internal Medicine

## 2018-08-22 ENCOUNTER — Telehealth: Payer: Self-pay | Admitting: *Deleted

## 2018-08-22 DIAGNOSIS — D519 Vitamin B12 deficiency anemia, unspecified: Secondary | ICD-10-CM | POA: Insufficient documentation

## 2018-08-22 DIAGNOSIS — D518 Other vitamin B12 deficiency anemias: Secondary | ICD-10-CM | POA: Insufficient documentation

## 2018-08-22 NOTE — Progress Notes (Signed)
ANNUAL PREVENTATIVE CARE GYN  ENCOUNTER NOTE  Subjective:       Sarah Edwards is a 27 y.o. [phone] female here for a routine annual gynecologic exam.  Current complaints: 1. Pain with intercourse since IUD placement, states she feels like her IUD is moving with penial insertion and thrusting  Denies difficulty breathing or respiratory distress, chest pain, abdominal pain, excessive vaginal bleeding, dysuria, and leg pain or swelling.    Gynecologic History  No LMP recorded. (Menstrual status: IUD).  Contraception: IUD, Mirena  Last Pap: 08/2015. Results were: normal  Obstetric History  OB History  Gravida Para Term Preterm AB Living  3 2 2  0 1 2  SAB TAB Ectopic Multiple Live Births  1 0 0 0 2    # Outcome Date GA Lbr Len/2nd Weight Sex Delivery Anes PTL Lv  3 Term 04/18/17 [redacted]w[redacted]d/ 00:14 7 lb 14.3 oz (3.58 kg) F Vag-Spont Local  LIV  2 Term 09/17/09   6 lb 12 oz (3.062 kg) F Vag-Spont   LIV  1 SAB        N     Past Medical History:  Diagnosis Date  . Abnormal uterine bleeding (AUB)   . Anemia   . Anemia during pregnancy in third trimester 03/21/2017  . Anxiety   . Chronic UTI   . Cro[place] disease ([place]   . Endometriosis   . IBS (irritable bowel syndrome)   . Painful menstrual periods   . SAB (spontaneous abortion) 01/06/2014    Past Surgical History:  Procedure Laterality Date  . COLONOSCOPY N/A 2010 and 2011   crohns  . ESOPHAGOGASTRODUODENOSCOPY ENDOSCOPY N/A 2010  . laproscopy  2012    Current Outpatient Medications on File Prior to Visit  Medication Sig Dispense Refill  . levonorgestrel (MIRENA) 20 MCG/24HR IUD 1 each by Intrauterine route once.    . valACYclovir (VALTREX) 500 MG tablet Take 500 mg by mouth as needed.     No current facility-administered medications on file prior to visit.     Allergies  Allergen Reactions  . Azithromycin Swelling  . Amoxicillin Rash  . Penicillins Rash    Childhood reaction  . Sulfa Antibiotics Nausea And Vomiting     Constant vomiting    Social History   Socioeconomic History  . Marital status: Single    Spouse name: Not on file  . Number of children: Not on file  . Years of education: Not on file  . 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 Financial resource strain: Not on file  . Food insecurity:    Worry: Not on file    Inability: Not on file  . Transportation needs:    Medical: Not on file    Non-medical: Not on file  Tobacco Use  . Smoking status: Former Smoker    Packs/day: 0.50    Years: 3.00    Pack years: 1.50    Types: Cigarettes    Last attempt to quit: 03/18/2015    Years since quitting: 3.4  . Smokeless tobacco: Never Used  Substance and Sexual Activity  . Alcohol use: Yes    Comment: occasional   . Drug use: No  . Sexual activity: Yes    Birth control/protection: IUD  Lifestyle  . Physical activity:    Days per week: Not on file    Minutes per session: Not on file  .  Stress: Not on file  Relationships  . Social connections:    Talks on phone: Not on file    Gets together: Not on file    Attends religious service: Not on file    Active member of club or organization: Not on file    Attends meetings of clubs or organizations: Not on file    Relationship status: Not on file  . Intimate partner violence:    Fear of current or ex partner: Not on file    Emotionally abused: Not on file    Physically abused: Not on file    Forced sexual activity: Not on file  Other Topics Concern  . Not on file  Social History Narrative   Lives in [place] with husband and dtr.  Does not routinely exercise.    Family History  Problem Relation Age of Onset  . Cancer Paternal Grandmother        breast   . Ovarian cancer Paternal Grandmother   . Cervical cancer Paternal Grandmother   . Lung cancer Paternal Grandfather   . Leukemia Maternal Grandfather   . Cancer Maternal Grandfather   .  Diabetes Mother   . Diabetes Father   . Cancer Paternal Aunt   . Breast cancer Maternal Grandmother   . Colon cancer Neg Hx     The following portions of the patient's history were reviewed and updated as appropriate: allergies, current medications, past family history, past medical history, past social history, past surgical history and problem list.  Review of Systems  ROS negative except as noted above. Information obtained from patient.    Objective:   BP 111/75   Pulse 75   Ht 5' 6"  (1.676 m)   Wt 171 lb 14.4 oz (78 kg)   BMI 27.75 kg/m    CONSTITUTIONAL: Well-developed, well-nourished female in no acute distress.   PSYCHIATRIC: Normal mood and affect. Normal behavior. Normal judgment and thought content.  [place]: Alert and oriented to person, place, and time. Normal muscle tone coordination. No cranial nerve deficit noted.  HENT:  Normocephalic, atraumatic, External right and left ear normal.  EYES: Conjunctivae and EOM are normal. Pupils are equal and round.   NECK: Normal range of motion, supple, no masses.  Normal thyroid.   SKIN: Skin is warm and dry. No rash noted. Not diaphoretic. No  erythema. No pallor.  CARDIOVASCULAR: Normal heart rate noted, regular rhythm, no murmur.  RESPIRATORY: Clear to auscultation bilaterally. Effort and breath sounds normal, no problems with respiration noted.  BREASTS: Symmetric in size. No masses, skin changes, nipple drainage, or lymphadenopathy.  ABDOMEN: Soft, normal bowel sounds, no distention noted.  No  tenderness, rebound or guarding.   PELVIC:  External Genitalia: Normal  BUS: Normal  Vagina: Normal  Cervix: Normal, PAP collected  Uterus: Normal  Adnexa: Normal   MUSCULOSKELETAL: Normal range of motion. No tenderness.  No cyanosis, clubbing, or edema.  2+ distal pulses.  LYMPHATIC: No Axillary, Supraclavicular, or Inguinal Adenopathy.  Assessment:   Annual gynecologic examination 27 y.o.    Contraception: IUD   Overweight   Problem List Items Addressed This Visit    None    Visit Diagnoses    Well woman exam    -  Primary   Relevant Orders   Cytology - PAP   US PELVIS TRANSVANGINAL NON-OB (TV ONLY)   US PELVIS (TRANSABDOMINAL ONLY)   Pain due to intrauterine contraceptive device (IUD), initial encounter ([place])       Relevant  Orders   US PELVIS TRANSVANGINAL NON-OB (TV ONLY)   US PELVIS (TRANSABDOMINAL ONLY)   Screening for cervical cancer       Relevant Orders   Cytology - PAP      Plan:   Pap: Pap, Reflex if ASCUS   Labs: Not needed   Routine preventative health maintenance measures emphasized: Exercise/Diet/Weight control, Tobacco Warnings, Alcohol/Substance use risks and Stress Management; see AVS  Reviewed red flag symptoms and when to call  RTC x 1 year for ANNUAL EXAM or sooner if needed  RTC for ultrasound to verify IUD placement   Diona Fanti, CNM [facility], [facility]

## 2018-08-22 NOTE — Telephone Encounter (Signed)
Called and spoke to patient and let her know that the B12 levels came down about 100 point.  It is still on normal level but Dr. Janese Banks would like her to be a little higher and that level and has recommended that she do 1000 mcg of B12 daily.  Also she sent a message to Dr. Army Melia so that she could keep an eye and monitor it and along the way also.  Patient agreeable to go and get the B12 1000 mcg daily as well as she was told about Dr. Army Melia to monitor it in the future

## 2018-08-22 NOTE — Telephone Encounter (Signed)
-----   Message from Sindy Guadeloupe, MD sent at 08/22/2018  8:15 AM EST ----- Please tell her to take po b12 1000 mcg daily.   Dr. Army Melia- I am not seeing her anymore for follow up. Can you keep an eye on her b12 levels?  Thanks, Astrid Divine

## 2018-08-25 ENCOUNTER — Other Ambulatory Visit: Payer: Self-pay | Admitting: Gastroenterology

## 2018-08-25 DIAGNOSIS — R1011 Right upper quadrant pain: Secondary | ICD-10-CM

## 2018-08-26 ENCOUNTER — Other Ambulatory Visit: Payer: 59

## 2018-08-26 ENCOUNTER — Ambulatory Visit: Payer: 59 | Admitting: Oncology

## 2018-08-28 ENCOUNTER — Ambulatory Visit: Payer: 59

## 2018-08-28 ENCOUNTER — Ambulatory Visit
Admission: RE | Admit: 2018-08-28 | Discharge: 2018-08-28 | Disposition: A | Discharge status: Home / Self Care | Payer: 59 | Source: Ambulatory Visit | Attending: Gastroenterology | Admitting: Gastroenterology

## 2018-08-28 DIAGNOSIS — R1011 Right upper quadrant pain: Secondary | ICD-10-CM | POA: Diagnosis not present

## 2018-08-29 LAB — CYTOLOGY - PAP: Diagnosis: NEGATIVE

## 2018-11-03 ENCOUNTER — Other Ambulatory Visit: Payer: Self-pay | Admitting: Internal Medicine

## 2018-11-03 ENCOUNTER — Telehealth: Payer: Self-pay

## 2018-11-03 NOTE — Telephone Encounter (Signed)
Patient called leaving VM saying she called the after hours line on Saturday and was given Tamiflu because her entire household has the flu.   Following starting Tamiflu she is now having body aches, fever, chills, vomiting, sore throat, and a cough. She is currently at home feeling like she has the flu and her child tested positive so she is taking care of them too.  Says she wants to know if she can receive a note for work. Told her she didn't have an office visit so we usually cannot give notes for this.  She says she can't hardly get up out of bed ( started crying ) and said her job will not just take she has the flu. Says they need a doctors note.  Pt requesting note for work.   Fax # [phone]

## 2018-11-25 ENCOUNTER — Other Ambulatory Visit: Payer: Self-pay

## 2018-11-25 ENCOUNTER — Ambulatory Visit: Payer: 59 | Admitting: Internal Medicine

## 2018-11-25 ENCOUNTER — Encounter: Payer: Self-pay | Admitting: Internal Medicine

## 2018-11-25 VITALS — BP 142/84 | HR 124 | Temp 99.0°F | Resp 16 | Ht 66.0 in | Wt 169.8 lb

## 2018-11-25 DIAGNOSIS — R6889 Other general symptoms and signs: Secondary | ICD-10-CM

## 2018-11-25 LAB — POCT INFLUENZA A/B
Influenza A, POC: NEGATIVE
Influenza B, POC: NEGATIVE

## 2018-11-25 NOTE — Patient Instructions (Signed)
Tamiflu 75 mg twice a day for 5 days  Tylenol or Advil every 8 hours

## 2018-11-25 NOTE — Progress Notes (Signed)
Date:  11/25/2018   Name:  Sarah Edwards   DOB:  03/28/1991   MRN:  [id number]   Chief Complaint: Sore Throat (started yesterday, bodyache today and headache )  Sore Throat   This is a new problem. The current episode started yesterday. The problem has been unchanged. The maximum temperature recorded prior to her arrival was 100.4 - 100.9 F. The pain is moderate. Associated symptoms include coughing, ear pain and headaches. Pertinent negatives include no shortness of breath. Exposure to: influenza.    Review of Systems  Constitutional: Positive for chills and fever. Negative for fatigue.  HENT: Positive for ear pain and sore throat.   Respiratory: Positive for cough. Negative for chest tightness, shortness of breath and wheezing.   Cardiovascular: Negative for chest pain.  Musculoskeletal: Positive for myalgias.  Neurological: Positive for headaches. Negative for dizziness.    Patient Active Problem List   Diagnosis Date Noted  . Macrocytic anemia with vitamin B12 deficiency 08/22/2018  . Bright red rectal bleeding 08/19/2018  . Abdominal pain, RUQ 08/19/2018  . 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  . Anemia during pregnancy in third trimester 03/21/2017  . Hypotension 09/24/2016  . Anemia 09/11/2016  . Acid reflux 01/12/2016  . Episodic mood disorder ([place]) 01/12/2016  . Endometriosis 01/07/2015  . Tobacco user 01/07/2015  . Anxiety 01/07/2015    Allergies  Allergen Reactions  . Azithromycin Swelling  . Amoxicillin Rash  . Penicillins Rash    Childhood reaction  . Sulfa Antibiotics Nausea And Vomiting    Constant vomiting    Past Surgical History:  Procedure Laterality Date  . COLONOSCOPY N/A 2010 and 2011   crohns  . ESOPHAGOGASTRODUODENOSCOPY ENDOSCOPY N/A 2010  . laproscopy  2012    Social History   Tobacco Use  . Smoking status: Former Smoker    Packs/day: 0.50    Years: 3.00    Pack years: 1.50    Types: Cigarettes    Last attempt  to quit: 03/18/2015    Years since quitting: 3.6  . Smokeless tobacco: Never Used  Substance Use Topics  . Alcohol use: Yes    Comment: occasional   . Drug use: No     Medication list has been reviewed and updated.  Current Meds  Medication Sig  . levonorgestrel (MIRENA) 20 MCG/24HR IUD 1 each by Intrauterine route once.  . valACYclovir (VALTREX) 500 MG tablet Take 500 mg by mouth as needed.    PHQ 2/9 Scores 09/04/2017  PHQ - 2 Score 0    Physical Exam Constitutional:      Appearance: She is ill-appearing.  HENT:     Right Ear: Tympanic membrane and ear canal normal.     Left Ear: Tympanic membrane and ear canal normal.     Mouth/Throat:     Mouth: Mucous membranes are moist.     Pharynx: No oropharyngeal exudate or posterior oropharyngeal erythema.     Tonsils: No tonsillar exudate or tonsillar abscesses.  Eyes:     Conjunctiva/sclera: Conjunctivae normal.  Neck:     Musculoskeletal: Normal range of motion and neck supple.  Cardiovascular:     Rate and Rhythm: Normal rate and regular rhythm.     Heart sounds: Normal heart sounds.  Pulmonary:     Effort: Pulmonary effort is normal.     Breath sounds: Normal breath sounds. No wheezing or rales.  Neurological:     Mental Status: She is alert.  Wt Readings from Last 3 Encounters:  11/25/18 169 lb 12.8 oz (77 kg)  08/21/18 171 lb 14.4 oz (78 kg)  08/21/18 171 lb 9.6 oz (77.8 kg)    BP (!) 142/84   Pulse (!) 124   Temp 99 F (37.2 C)   Resp 16   Ht 5' 6"  (1.676 m)   Wt 169 lb 12.8 oz (77 kg)   SpO2 97%   BMI 27.41 kg/m   Assessment and Plan: 1. Flu-like symptoms Tylenol or Advil three times a day Continue fluids Take Tamiflu 75 mg bid x 5 days - POCT Influenza A/B   Partially dictated using Editor, commissioning. Any errors are unintentional.  Halina Maidens, MD [facility]  11/25/2018

## 2019-05-22 ENCOUNTER — Other Ambulatory Visit: Payer: Self-pay

## 2019-05-22 ENCOUNTER — Ambulatory Visit (INDEPENDENT_AMBULATORY_CARE_PROVIDER_SITE_OTHER): Payer: Self-pay | Admitting: Internal Medicine

## 2019-05-22 ENCOUNTER — Encounter: Payer: Self-pay | Admitting: Internal Medicine

## 2019-05-22 VITALS — Temp 98.4°F | Ht 66.0 in | Wt 162.0 lb

## 2019-05-22 DIAGNOSIS — R0982 Postnasal drip: Secondary | ICD-10-CM

## 2019-05-22 NOTE — Progress Notes (Signed)
Date:  05/22/2019   Name:  Sarah Edwards   DOB:  [birth year]   MRN:  [id number]  I connected with this patient, Sarah Edwards, by telephone at the patient's home.  I verified that I am speaking with the correct person using two identifiers. This visit was conducted via telephone due to the Covid-19 outbreak from my office at [facility] in [place], [place]. I discussed the limitations, risks, security and privacy concerns of performing an evaluation and management service by telephone. I also discussed with the patient that there may be a patient responsible charge related to this service. The patient expressed understanding and agreed to proceed.  Chief Complaint: Sore Throat (Needs letter for work to go back. Sore throat- with drainage. Feels like she has allergies. Eyes watering and sinus headache. Throat only hurts when waking up.)  Sore Throat  This is a new problem. The current episode started yesterday. The problem has been resolved. There has been no fever. Pertinent negatives include no congestion, coughing, ear pain, headaches, plugged ear sensation, shortness of breath, swollen glands or trouble swallowing.  She has no taste or smell distortion.  No family members are sick and she has no known exposure to Covid-19.   Her throat is normal today.  She has fall allergies and has started back on her daily allergy medication.   Review of Systems  Constitutional: Negative for chills, fatigue and fever.  HENT: Negative for congestion, ear pain, sore throat (yesterday but only lasted a few hours) and trouble swallowing.   Respiratory: Negative for cough and shortness of breath.   Cardiovascular: Negative for chest pain and palpitations.  Allergic/Immunologic: Positive for environmental allergies.  Neurological: Negative for dizziness, light-headedness and headaches.  Psychiatric/Behavioral: Negative for dysphoric mood and sleep disturbance.    Patient Active Problem List   Diagnosis  Date Noted  . Macrocytic anemia with vitamin B12 deficiency 08/22/2018  . Bright red rectal bleeding 08/19/2018  . Abdominal pain, RUQ 08/19/2018  . 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  . Anemia during pregnancy in third trimester 03/21/2017  . Hypotension 09/24/2016  . Anemia 09/11/2016  . Acid reflux 01/12/2016  . Episodic mood disorder ([place]) 01/12/2016  . Endometriosis 01/07/2015  . Tobacco user 01/07/2015  . Anxiety 01/07/2015    Allergies  Allergen Reactions  . Azithromycin Swelling  . Amoxicillin Rash  . Penicillins Rash    Childhood reaction  . Sulfa Antibiotics Nausea And Vomiting    Constant vomiting    Past Surgical History:  Procedure Laterality Date  . COLONOSCOPY N/A 2010 and 2011   crohns  . ESOPHAGOGASTRODUODENOSCOPY ENDOSCOPY N/A 2010  . laproscopy  2012    Social History   Tobacco Use  . Smoking status: Former Smoker    Packs/day: 0.50    Years: 3.00    Pack years: 1.50    Types: Cigarettes    Quit date: 03/18/2015    Years since quitting: 4.1  . Smokeless tobacco: Never Used  Substance Use Topics  . Alcohol use: Yes    Comment: occasional   . Drug use: No     Medication list has been reviewed and updated.  Current Meds  Medication Sig  . levonorgestrel (MIRENA) 20 MCG/24HR IUD 1 each by Intrauterine route once.  . valACYclovir (VALTREX) 500 MG tablet Take 500 mg by mouth as needed.    PHQ 2/9 Scores 05/22/2019 09/04/2017  PHQ - 2 Score 0 0    BP Readings  from Last 3 Encounters:  11/25/18 (!) 142/84  08/21/18 111/75  08/21/18 127/81    Physical Exam HENT:     Head:     Comments: Voice is normal. Pulmonary:     Comments: No cough or dyspnea noted during the call Neurological:     Mental Status: She is alert.  Psychiatric:        Attention and Perception: Attention normal.        Mood and Affect: Mood normal.        Speech: Speech normal.        Cognition and Memory: Cognition normal.     Wt Readings from  Last 3 Encounters:  05/22/19 162 lb (73.5 kg)  11/25/18 169 lb 12.8 oz (77 kg)  08/21/18 171 lb 14.4 oz (78 kg)    Temp 98.4 F (36.9 C) (Oral)   Ht 5' 6"  (1.676 m)   Wt 162 lb (73.5 kg)   BMI 26.15 kg/m   Assessment and Plan: 1. Post-nasal drainage This the likely cause of her temporary throat symptoms Covid-19 infection unlikely Letter written for her to return to work She will continue her allergy medications.  I spent 7 minutes on this encounter. Partially dictated using Editor, commissioning. Any errors are unintentional.  Halina Maidens, MD [facility]  05/22/2019

## 2019-06-03 ENCOUNTER — Ambulatory Visit (INDEPENDENT_AMBULATORY_CARE_PROVIDER_SITE_OTHER): Payer: Self-pay

## 2019-06-03 ENCOUNTER — Ambulatory Visit
Admission: EM | Admit: 2019-06-03 | Discharge: 2019-06-03 | Disposition: A | Discharge status: Home / Self Care | Payer: Self-pay | Attending: Family Medicine | Admitting: Family Medicine

## 2019-06-03 ENCOUNTER — Encounter: Payer: Self-pay | Admitting: Emergency Medicine

## 2019-06-03 ENCOUNTER — Other Ambulatory Visit: Payer: Self-pay

## 2019-06-03 DIAGNOSIS — M7750 Other enthesopathy of unspecified foot: Secondary | ICD-10-CM

## 2019-06-03 DIAGNOSIS — X501XXA Overexertion from prolonged static or awkward postures, initial encounter: Secondary | ICD-10-CM

## 2019-06-03 DIAGNOSIS — M25572 Pain in left ankle and joints of left foot: Secondary | ICD-10-CM

## 2019-06-03 MED ORDER — MELOXICAM 15 MG PO TABS: 15.0000 mg | ORAL_TABLET | Freq: Every day | ORAL | 0 refills | Status: DC

## 2019-06-03 NOTE — Discharge Instructions (Signed)
Rest, ice, anti-inflammatories

## 2019-06-03 NOTE — ED Provider Notes (Signed)
[facility]    CSN: [id number] Arrival date & time: 06/03/19  9163      History   Chief Complaint Chief Complaint  Patient presents with  . Foot Pain    HPI Sarah Edwards is a 28 y.o. female.   28 yo female with a c/o left foot/ankle pain since yesterday after squatting down and feeling sudden onset of pain and "a pop". States this morning it happened again after squatting at home, causing her to fall down the stairs.    Foot Pain    Past Medical History:  Diagnosis Date  . Abnormal uterine bleeding (AUB)   . Anemia   . Anemia during pregnancy in third trimester 03/21/2017  . Anxiety   . Chronic UTI   . Crohn disease ([place])   . Endometriosis   . IBS (irritable bowel syndrome)   . Painful menstrual periods   . SAB (spontaneous abortion) 01/06/2014    Patient Active Problem List   Diagnosis Date Noted  . Macrocytic anemia with vitamin B12 deficiency 08/22/2018  . Bright red rectal bleeding 08/19/2018  . Abdominal pain, RUQ 08/19/2018  . 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  . Anemia during pregnancy in third trimester 03/21/2017  . Hypotension 09/24/2016  . Anemia 09/11/2016  . Acid reflux 01/12/2016  . Episodic mood disorder ([place]) 01/12/2016  . Endometriosis 01/07/2015  . Tobacco user 01/07/2015  . Anxiety 01/07/2015    Past Surgical History:  Procedure Laterality Date  . COLONOSCOPY N/A 2010 and 2011   crohns  . ESOPHAGOGASTRODUODENOSCOPY ENDOSCOPY N/A 2010  . laproscopy  2012    OB History    Gravida  3   Para  2   Term  2   Preterm  0   AB  1   Living  2     SAB  1   TAB  0   Ectopic  0   Multiple  0   Live Births  2            Home Medications    Prior to Admission medications   Medication Sig Start Date End Date Taking? Authorizing Provider  levonorgestrel (MIRENA) 20 MCG/24HR IUD 1 each by Intrauterine route once.   Yes [provider]  meloxicam (MOBIC) 15 MG tablet Take 1 tablet  (15 mg total) by mouth daily. 06/03/19   Norval Gable, MD  valACYclovir (VALTREX) 500 MG tablet Take 500 mg by mouth as needed.    [provider]    Family History Family History  Problem Relation Age of Onset  . Cancer Paternal Grandmother        breast   . Ovarian cancer Paternal Grandmother   . Cervical cancer Paternal Grandmother   . Lung cancer Paternal Grandfather   . Leukemia Maternal Grandfather   . Cancer Maternal Grandfather   . Diabetes Mother   . Diabetes Father   . Cancer Paternal Aunt   . Breast cancer Maternal Grandmother   . Colon cancer Neg Hx     Social History Social History   Tobacco Use  . Smoking status: Former Smoker    Packs/day: 0.50    Years: 3.00    Pack years: 1.50    Types: Cigarettes    Quit date: 03/18/2015    Years since quitting: 4.2  . Smokeless tobacco: Never Used  Substance Use Topics  . Alcohol use: Yes    Comment: occasional   . Drug use: No  Allergies   Azithromycin, Amoxicillin, Penicillins, and Sulfa antibiotics   Review of Systems Review of Systems   Physical Exam Triage Vital Signs ED Triage Vitals  Enc Vitals Group     BP 06/03/19 0845 113/72     Pulse Rate 06/03/19 0845 85     Resp 06/03/19 0845 18     Temp 06/03/19 0845 98.4 F (36.9 C)     Temp Source 06/03/19 0845 Oral     SpO2 06/03/19 0845 100 %     Weight 06/03/19 0843 160 lb (72.6 kg)     Height 06/03/19 0843 5' 5"  (1.651 m)     Head Circumference --      Peak Flow --      Pain Score 06/03/19 0843 6     Pain Loc --      Pain Edu? --      Excl. in [place]? --    No data found.  Updated Vital Signs BP 113/72 (BP Location: Right Arm)   Pulse 85   Temp 98.4 F (36.9 C) (Oral)   Resp 18   Ht 5' 5"  (1.651 m)   Wt 72.6 kg   SpO2 100%   BMI 26.63 kg/m   Visual Acuity Right Eye Distance:   Left Eye Distance:   Bilateral Distance:    Right Eye Near:   Left Eye Near:    Bilateral Near:     Physical Exam Vitals signs and nursing  note reviewed.  Constitutional:      General: She is not in acute distress.    Appearance: She is not toxic-appearing or diaphoretic.  Musculoskeletal:     Left ankle: She exhibits normal range of motion, no swelling, no ecchymosis, no deformity, no laceration and normal pulse. Tenderness (anterior). Achilles tendon normal.     Comments: Tenderness to palpation over the anterior aspect of the ankle, mainly over the tibialis anterior tendon; foot neurovascularly intact  Neurological:     Mental Status: She is alert.      UC Treatments / Results  Labs (all labs ordered are listed, but only abnormal results are displayed) Labs Reviewed - No data to display  EKG   Radiology Dg Ankle Complete Left  Result Date: 06/03/2019 CLINICAL DATA:  Injury yesterday with subsequent fall. EXAM: LEFT ANKLE COMPLETE - 3+ VIEW COMPARISON:  None. FINDINGS: There is no evidence of fracture, dislocation, or joint effusion. There is no evidence of arthropathy or other focal bone abnormality. Soft tissues are unremarkable. IMPRESSION: Negative. Electronically Signed   By: Nelson Chimes M.D.   On: 06/03/2019 09:40    Procedures Procedures (including critical care time)  Medications Ordered in UC Medications - No data to display  Initial Impression / Assessment and Plan / UC Course  I have reviewed the triage vital signs and the nursing notes.  Pertinent labs & imaging results that were available during my care of the patient were reviewed by me and considered in my medical decision making (see chart for details).      Final Clinical Impressions(s) / UC Diagnoses   Final diagnoses:  Tendonitis of ankle or foot     Discharge Instructions     Rest, ice, anti-inflammatories    ED Prescriptions    Medication Sig Dispense Auth. Provider   meloxicam (MOBIC) 15 MG tablet Take 1 tablet (15 mg total) by mouth daily. 30 tablet Norval Gable, MD      1. x-ray results and diagnosis reviewed with  patient 2.  rx as per orders above; reviewed possible side effects, interactions, risks and benefits  3. Recommend supportive treatment as above  4. Follow-up prn if symptoms worsen or don't improve  Controlled Substance Prescriptions [place] Controlled Substance Registry consulted? Not Applicable   Norval Gable, MD 06/03/19 1038

## 2019-06-03 NOTE — ED Triage Notes (Signed)
Patient states yesterday she squatted down and her left foot popped. She stated it happened again this morning and she fell down the stairs.

## 2019-08-04 ENCOUNTER — Other Ambulatory Visit: Payer: Self-pay

## 2019-08-04 ENCOUNTER — Telehealth: Payer: Self-pay

## 2019-08-04 DIAGNOSIS — Z20822 Contact with and (suspected) exposure to covid-19: Secondary | ICD-10-CM

## 2019-08-04 NOTE — Telephone Encounter (Signed)
Patient called saying her daughters grandmother tested positive for Covid last week. She then took her kids and her and her husband were tested. Everyone's test last week came back negative accept for her oldest daughter who is asymptomatic. She woke up yesterday with body aches all over, sore throat, and headaches. She is asking if we recommend her to be retested since she is now experiencing sxs and was not when she was tested before. Her main concern in her infant that she is caring for.  Informed her it would not be a bad idea to be tested again. Recommended [facility] and she said she stood in line last week for 4 hours only to be dismissed and told they were doing no more testing by the time she got to the front of the line. She prefers not to go back there. She went to the [facility] walk in clinic and they told her they can test her but she needs a doctors order. Explained we do not order things for [facility]. Recommended [facility] and she said she may do this and try to make an appt online to be seen and tested today.  Benedict Needy, CMA

## 2019-08-05 LAB — NOVEL CORONAVIRUS, NAA: SARS-CoV-2, NAA: NOT DETECTED

## 2019-08-19 ENCOUNTER — Other Ambulatory Visit: Payer: Self-pay

## 2019-08-19 DIAGNOSIS — Z20822 Contact with and (suspected) exposure to covid-19: Secondary | ICD-10-CM

## 2019-08-21 LAB — NOVEL CORONAVIRUS, NAA: SARS-CoV-2, NAA: NOT DETECTED

## 2019-12-09 ENCOUNTER — Telehealth: Payer: Self-pay

## 2019-12-09 ENCOUNTER — Encounter: Payer: Self-pay | Admitting: Internal Medicine

## 2019-12-09 ENCOUNTER — Other Ambulatory Visit: Payer: Self-pay

## 2019-12-09 NOTE — Telephone Encounter (Signed)
I don't give temporary handicapped stickers for sprains.

## 2019-12-09 NOTE — Telephone Encounter (Signed)
Left VM informing patient of this. Told her these are usually used for long term use.    CM

## 2019-12-09 NOTE — Telephone Encounter (Signed)
Patient called and left a VM saying she has a ankle sprain per urgent care at [facility].  She was told to contact her PCP to get a temporary handicap sticker for her car for the next few weeks since she is unable to walk long distances.  Please advise?

## 2020-03-24 DIAGNOSIS — M659 Unspecified synovitis and tenosynovitis, unspecified site: Secondary | ICD-10-CM | POA: Insufficient documentation

## 2020-06-24 ENCOUNTER — Other Ambulatory Visit: Payer: Self-pay | Admitting: Oncology

## 2020-06-24 DIAGNOSIS — D509 Iron deficiency anemia, unspecified: Secondary | ICD-10-CM

## 2020-06-24 DIAGNOSIS — E538 Deficiency of other specified B group vitamins: Secondary | ICD-10-CM

## 2020-06-24 NOTE — Progress Notes (Signed)
cbc

## 2020-06-27 ENCOUNTER — Inpatient Hospital Stay: Payer: Managed Care, Other (non HMO) | Attending: Oncology

## 2020-06-27 ENCOUNTER — Other Ambulatory Visit: Payer: Self-pay

## 2020-06-27 DIAGNOSIS — E538 Deficiency of other specified B group vitamins: Secondary | ICD-10-CM | POA: Diagnosis not present

## 2020-06-27 DIAGNOSIS — D509 Iron deficiency anemia, unspecified: Secondary | ICD-10-CM

## 2020-06-27 LAB — CBC WITH DIFFERENTIAL/PLATELET
Abs Immature Granulocytes: 0.02 K/uL (ref 0.00–0.07)
Basophils Absolute: 0 K/uL (ref 0.0–0.1)
Basophils Relative: 0 %
Eosinophils Absolute: 0.1 K/uL (ref 0.0–0.5)
Eosinophils Relative: 1 %
HCT: 39.9 % (ref 36.0–46.0)
Hemoglobin: 13.8 g/dL (ref 12.0–15.0)
Immature Granulocytes: 0 %
Lymphocytes Relative: 31 %
Lymphs Abs: 2.5 K/uL (ref 0.7–4.0)
MCH: 30.5 pg (ref 26.0–34.0)
MCHC: 34.6 g/dL (ref 30.0–36.0)
MCV: 88.1 fL (ref 80.0–100.0)
Monocytes Absolute: 0.4 K/uL (ref 0.1–1.0)
Monocytes Relative: 5 %
Neutro Abs: 5 K/uL (ref 1.7–7.7)
Neutrophils Relative %: 63 %
Platelets: 188 K/uL (ref 150–400)
RBC: 4.53 MIL/uL (ref 3.87–5.11)
RDW: 11.6 % (ref 11.5–15.5)
WBC: 8 K/uL (ref 4.0–10.5)
nRBC: 0 % (ref 0.0–0.2)

## 2020-06-27 LAB — IRON AND TIBC
Iron: 76 ug/dL (ref 28–170)
Saturation Ratios: 22 % (ref 10.4–31.8)
TIBC: 344 ug/dL (ref 250–450)
UIBC: 268 ug/dL

## 2020-06-27 LAB — COMPREHENSIVE METABOLIC PANEL
ALT: 22 U/L (ref 0–44)
AST: 26 U/L (ref 15–41)
Albumin: 4.9 g/dL (ref 3.5–5.0)
Alkaline Phosphatase: 69 U/L (ref 38–126)
Anion gap: 10 (ref 5–15)
BUN: 10 mg/dL (ref 6–20)
CO2: 23 mmol/L (ref 22–32)
Calcium: 9 mg/dL (ref 8.9–10.3)
Chloride: 107 mmol/L (ref 98–111)
Creatinine, Ser: 0.76 mg/dL (ref 0.44–1.00)
GFR, Estimated: >60 mL/min (ref >60)
Glucose, Bld: 104 mg/dL — ABNORMAL HIGH (ref 70–99)
Potassium: 3.6 mmol/L (ref 3.5–5.1)
Sodium: 140 mmol/L (ref 135–145)
Total Bilirubin: 0.4 mg/dL (ref 0.3–1.2)
Total Protein: 8.5 g/dL — ABNORMAL HIGH (ref 6.5–8.1)

## 2020-06-27 LAB — FERRITIN: Ferritin: 106 ng/mL (ref 11–307)

## 2020-06-27 LAB — VITAMIN B12: Vitamin B-12: 257 pg/mL (ref 180–914)

## 2020-06-30 ENCOUNTER — Inpatient Hospital Stay (HOSPITAL_BASED_OUTPATIENT_CLINIC_OR_DEPARTMENT_OTHER): Payer: Managed Care, Other (non HMO) | Admitting: Oncology

## 2020-06-30 ENCOUNTER — Other Ambulatory Visit: Payer: Self-pay | Admitting: *Deleted

## 2020-06-30 ENCOUNTER — Other Ambulatory Visit: Payer: Self-pay

## 2020-06-30 ENCOUNTER — Inpatient Hospital Stay: Payer: Managed Care, Other (non HMO)

## 2020-06-30 DIAGNOSIS — E538 Deficiency of other specified B group vitamins: Secondary | ICD-10-CM

## 2020-06-30 DIAGNOSIS — D509 Iron deficiency anemia, unspecified: Secondary | ICD-10-CM

## 2020-06-30 MED ORDER — CYANOCOBALAMIN 1000 MCG/ML IJ SOLN
1000.0000 ug | Freq: Once | INTRAMUSCULAR | Status: AC
  Administered 2020-06-30: 1000 ug via INTRAMUSCULAR
  Filled 2020-06-30: qty 1

## 2020-07-01 NOTE — Progress Notes (Signed)
Hematology/Oncology Consult note [facility]  [phone] Fax:([phone]) [phone]  Patient Care Team: Glean Hess, MD as PCP - General (Internal Medicine)   Name of the patient: Sarah Edwards  [id number]  [birth year]   Date of visit: 07/01/20  Diagnosis-history of iron and B12 deficiency anemia  Chief complaint/ Reason for visit-routine visit for ongoing fatigue  Heme/Onc history: Patient is a 29 year old female who was last seen by me in December 2019 for iron and B12 deficiency anemia of pregnancy when she received IV iron as well as B12.  Interval history-presently patient reports numbness and right ankle swelling ever since she underwent ankle reconstructive surgery in July 2021 following a fall.  She also reports feeling significant fatigue day-to-day.  She recollects one particular episode when she was at physical therapy after ankle surgery when she felt like she was going to pass out.  She has not had any such syncopal episodes since then.  ECOG PS- 1 Pain scale- 0 Opioid associated constipation- no  Review of systems- Review of Systems  Constitutional: Positive for malaise/fatigue. Negative for chills, fever and weight loss.  HENT: Negative for congestion, ear discharge and nosebleeds.   Eyes: Negative for blurred vision.  Respiratory: Negative for cough, hemoptysis, sputum production, shortness of breath and wheezing.   Cardiovascular: Negative for chest pain, palpitations, orthopnea and claudication.  Gastrointestinal: Negative for abdominal pain, blood in stool, constipation, diarrhea, heartburn, melena, nausea and vomiting.  Genitourinary: Negative for dysuria, flank pain, frequency, hematuria and urgency.  Musculoskeletal: Negative for back pain, joint pain and myalgias.       Right ankle swelling  Skin: Negative for rash.  Neurological: Negative for dizziness, tingling, focal weakness, seizures, weakness and headaches.    Endo/Heme/Allergies: Does not bruise/bleed easily.  Psychiatric/Behavioral: Negative for depression and suicidal ideas. The patient does not have insomnia.      Allergies  Allergen Reactions  . Azithromycin Swelling  . Amoxicillin Rash  . Penicillins Rash    Childhood reaction  . Sulfa Antibiotics Nausea And Vomiting    Constant vomiting     Past Medical History:  Diagnosis Date  . Abnormal uterine bleeding (AUB)   . Anemia   . Anemia during pregnancy in third trimester 03/21/2017  . Anxiety   . Chronic UTI   . Crohn disease ([place])   . Endometriosis   . IBS (irritable bowel syndrome)   . Painful menstrual periods   . SAB (spontaneous abortion) 01/06/2014     Past Surgical History:  Procedure Laterality Date  . COLONOSCOPY N/A 2010 and 2011   crohns  . ESOPHAGOGASTRODUODENOSCOPY ENDOSCOPY N/A 2010  . laproscopy  2012    Social History   Socioeconomic History  . Marital status: Married    Spouse name: Not on file  . Number of children: 2  . Years of education: Not on file  . 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 Smoking status: Former Smoker    Packs/day: 0.50    Years: 3.00    Pack years: 1.50    Types: Cigarettes    Quit date: 03/18/2015    Years since quitting: 5.2  . Smokeless tobacco: Never Used  Vaping Use  . Vaping Use: Never used  Substance and Sexual Activity  . Alcohol use: Yes    Comment: occasional   . Drug use: No  . Sexual activity: Yes  Birth control/protection: I.U.D.  Other Topics Concern  . Not on file  Social History Narrative   Lives in [place] with husband and dtr.  Does not routinely exercise.   Social Determinants of Health   Financial Resource Strain:   . Difficulty of Paying Living Expenses: Not on file  Food Insecurity:   . Worried About Charity fundraiser in the Last Year: Not on file  . Ran Out of Food in the Last Year: Not on file   Transportation Needs:   . Lack of Transportation (Medical): Not on file  . Lack of Transportation (Non-Medical): Not on file  Physical Activity:   . Days of Exercise per Week: Not on file  . Minutes of Exercise per Session: Not on file  Stress:   . Feeling of Stress : Not on file  Social Connections:   . Frequency of Communication with Friends and Family: Not on file  . Frequency of Social Gatherings with Friends and Family: Not on file  . Attends Religious Services: Not on file  . Active Member of Clubs or Organizations: Not on file  . Attends Archivist Meetings: Not on file  . Marital Status: Not on file  Intimate Partner Violence:   . Fear of Current or Ex-Partner: Not on file  . Emotionally Abused: Not on file  . Physically Abused: Not on file  . Sexually Abused: Not on file    Family History  Problem Relation Age of Onset  . Cancer Paternal Grandmother        breast   . Ovarian cancer Paternal Grandmother   . Cervical cancer Paternal Grandmother   . Lung cancer Paternal Grandfather   . Leukemia Maternal Grandfather   . Cancer Maternal Grandfather   . Diabetes Mother   . Diabetes Father   . Cancer Paternal Aunt   . Breast cancer Maternal Grandmother   . Colon cancer Neg Hx      Current Outpatient Medications:  .  levonorgestrel (MIRENA) 20 MCG/24HR IUD, 1 each by Intrauterine route once., Disp: , Rfl:  .  meloxicam (MOBIC) 15 MG tablet, Take 1 tablet (15 mg total) by mouth daily. (Patient not taking: Reported on 06/30/2020), Disp: 30 tablet, Rfl: 0 .  valACYclovir (VALTREX) 500 MG tablet, Take 500 mg by mouth as needed. (Patient not taking: Reported on 06/30/2020), Disp: , Rfl:   Physical exam:  Physical Exam Constitutional:      General: She is not in acute distress. Cardiovascular:     Rate and Rhythm: Normal rate and regular rhythm.     Heart sounds: Normal heart sounds.  Pulmonary:     Effort: Pulmonary effort is normal.     Breath sounds:  Normal breath sounds.  Abdominal:     General: Bowel sounds are normal.     Palpations: Abdomen is soft.  Musculoskeletal:     Comments: Right ankle edema  Skin:    General: Skin is warm and dry.  Neurological:     Mental Status: She is alert and oriented to person, place, and time.      CMP Latest Ref Rng & Units 06/27/2020  Glucose 70 - 99 mg/dL 104(H)  BUN 6 - 20 mg/dL 10  Creatinine 0.44 - 1.00 mg/dL 0.76  Sodium 135 - 145 mmol/L 140  Potassium 3.5 - 5.1 mmol/L 3.6  Chloride 98 - 111 mmol/L 107  CO2 22 - 32 mmol/L 23  Calcium 8.9 - 10.3 mg/dL 9.0  Total Protein 6.5 -  8.1 g/dL 8.5(H)  Total Bilirubin 0.3 - 1.2 mg/dL 0.4  Alkaline Phos 38 - 126 U/L 69  AST 15 - 41 U/L 26  ALT 0 - 44 U/L 22   CBC Latest Ref Rng & Units 06/27/2020  WBC 4.0 - 10.5 K/uL 8.0  Hemoglobin 12.0 - 15.0 g/dL 13.8  Hematocrit 36 - 46 % 39.9  Platelets 150 - 400 K/uL 188     Assessment and plan- Patient is a 29 y.o. female with prior history of iron deficiency and B12 deficiency now presenting with symptoms of fatigue  Patient is not currently anemic.  Her iron studies are normal.  She does have a low B12 level of 257.  We can do monthly B12 shots to see if her fatigue levels improved although I doubt that we can attribute her fatigue entirely to low B12 levels especially given that she is not anemic.  I will reassess her symptoms in 3 months after giving B12 injections I will check a TSH at that time as well  Based on history it appears that near syncopal event was vasovagal in etiology.  If these episodes recur she may need further evaluation including cardiology referral   Visit Diagnosis 1. B12 deficiency      Dr. Randa Evens, MD, MPH [facility] at [facility] [phone] 07/01/2020 3:57 PM

## 2020-08-03 ENCOUNTER — Inpatient Hospital Stay: Payer: Managed Care, Other (non HMO) | Attending: Oncology

## 2020-08-03 ENCOUNTER — Other Ambulatory Visit: Payer: Self-pay | Admitting: *Deleted

## 2020-08-03 ENCOUNTER — Other Ambulatory Visit: Payer: Self-pay

## 2020-08-03 DIAGNOSIS — Z79899 Other long term (current) drug therapy: Secondary | ICD-10-CM | POA: Insufficient documentation

## 2020-08-03 DIAGNOSIS — E538 Deficiency of other specified B group vitamins: Secondary | ICD-10-CM | POA: Diagnosis present

## 2020-08-03 MED ORDER — CYANOCOBALAMIN 1000 MCG/ML IJ SOLN: 1000.0000 ug | Freq: Once | INTRAMUSCULAR | Status: DC

## 2020-08-03 MED ORDER — CYANOCOBALAMIN 1000 MCG/ML IJ SOLN
1000.0000 ug | Freq: Once | INTRAMUSCULAR | Status: AC
  Administered 2020-08-03: 1000 ug via INTRAMUSCULAR
  Filled 2020-08-03: qty 1

## 2020-08-04 ENCOUNTER — Ambulatory Visit: Payer: Managed Care, Other (non HMO)

## 2020-09-01 ENCOUNTER — Inpatient Hospital Stay: Payer: Managed Care, Other (non HMO) | Attending: Oncology

## 2020-09-01 ENCOUNTER — Other Ambulatory Visit: Payer: Self-pay | Admitting: Oncology

## 2020-09-01 ENCOUNTER — Other Ambulatory Visit: Payer: Self-pay

## 2020-09-01 DIAGNOSIS — E538 Deficiency of other specified B group vitamins: Secondary | ICD-10-CM | POA: Insufficient documentation

## 2020-09-01 DIAGNOSIS — Z79899 Other long term (current) drug therapy: Secondary | ICD-10-CM | POA: Diagnosis not present

## 2020-09-01 DIAGNOSIS — O99013 Anemia complicating pregnancy, third trimester: Secondary | ICD-10-CM

## 2020-09-01 MED ORDER — CYANOCOBALAMIN 1000 MCG/ML IJ SOLN
1000.0000 ug | INTRAMUSCULAR | Status: DC
  Administered 2020-09-01: 15:00:00 1000 ug via INTRAMUSCULAR
  Filled 2020-09-01: qty 1

## 2020-10-04 ENCOUNTER — Inpatient Hospital Stay: Payer: Managed Care, Other (non HMO)

## 2020-10-04 ENCOUNTER — Inpatient Hospital Stay: Payer: Managed Care, Other (non HMO) | Admitting: Oncology

## 2020-10-31 ENCOUNTER — Inpatient Hospital Stay: Payer: Managed Care, Other (non HMO) | Attending: Oncology

## 2020-10-31 ENCOUNTER — Inpatient Hospital Stay (HOSPITAL_BASED_OUTPATIENT_CLINIC_OR_DEPARTMENT_OTHER): Payer: Managed Care, Other (non HMO) | Admitting: Oncology

## 2020-10-31 ENCOUNTER — Telehealth: Payer: Self-pay | Admitting: Oncology

## 2020-10-31 ENCOUNTER — Encounter: Payer: Self-pay | Admitting: Oncology

## 2020-10-31 VITALS — BP 128/75 | HR 88 | Temp 99.0°F | Resp 20 | Wt 173.3 lb

## 2020-10-31 DIAGNOSIS — Z806 Family history of leukemia: Secondary | ICD-10-CM | POA: Insufficient documentation

## 2020-10-31 DIAGNOSIS — Z87440 Personal history of urinary (tract) infections: Secondary | ICD-10-CM | POA: Diagnosis not present

## 2020-10-31 DIAGNOSIS — Z87891 Personal history of nicotine dependence: Secondary | ICD-10-CM | POA: Insufficient documentation

## 2020-10-31 DIAGNOSIS — E538 Deficiency of other specified B group vitamins: Secondary | ICD-10-CM | POA: Insufficient documentation

## 2020-10-31 DIAGNOSIS — Z803 Family history of malignant neoplasm of breast: Secondary | ICD-10-CM | POA: Insufficient documentation

## 2020-10-31 DIAGNOSIS — Z793 Long term (current) use of hormonal contraceptives: Secondary | ICD-10-CM | POA: Insufficient documentation

## 2020-10-31 DIAGNOSIS — Z8041 Family history of malignant neoplasm of ovary: Secondary | ICD-10-CM | POA: Insufficient documentation

## 2020-10-31 DIAGNOSIS — K5090 Crohn's disease, unspecified, without complications: Secondary | ICD-10-CM | POA: Diagnosis not present

## 2020-10-31 DIAGNOSIS — R5383 Other fatigue: Secondary | ICD-10-CM | POA: Insufficient documentation

## 2020-10-31 DIAGNOSIS — Z79899 Other long term (current) drug therapy: Secondary | ICD-10-CM | POA: Insufficient documentation

## 2020-10-31 DIAGNOSIS — D509 Iron deficiency anemia, unspecified: Secondary | ICD-10-CM

## 2020-10-31 LAB — CBC
HCT: 39.9 % (ref 36.0–46.0)
Hemoglobin: 13.4 g/dL (ref 12.0–15.0)
MCH: 30.3 pg (ref 26.0–34.0)
MCHC: 33.6 g/dL (ref 30.0–36.0)
MCV: 90.3 fL (ref 80.0–100.0)
Platelets: 255 10*3/uL (ref 150–400)
RBC: 4.42 MIL/uL (ref 3.87–5.11)
RDW: 11.9 % (ref 11.5–15.5)
WBC: 7 10*3/uL (ref 4.0–10.5)
nRBC: 0 % (ref 0.0–0.2)

## 2020-10-31 LAB — IRON AND TIBC
Iron: 56 ug/dL (ref 28–170)
Saturation Ratios: 16 % (ref 10.4–31.8)
TIBC: 349 ug/dL (ref 250–450)
UIBC: 293 ug/dL

## 2020-10-31 LAB — FERRITIN: Ferritin: 86 ng/mL (ref 11–307)

## 2020-10-31 LAB — TSH: TSH: 2.767 u[IU]/mL (ref 0.350–4.500)

## 2020-10-31 LAB — VITAMIN B12: Vitamin B-12: 350 pg/mL (ref 180–914)

## 2020-10-31 MED ORDER — "SYRINGE 25G X 5/8"" 3 ML MISC": 0 refills | Status: DC

## 2020-10-31 MED ORDER — CYANOCOBALAMIN 1000 MCG/ML IJ SOLN: 1000.0000 ug | INTRAMUSCULAR | 11 refills | Status: DC

## 2020-10-31 NOTE — Telephone Encounter (Signed)
Called patient to schedule 6 and 12 month appts. Patient confirmed dates--mailing AVS.

## 2020-11-01 NOTE — Progress Notes (Signed)
Hematology/Oncology Consult note [facility]  Telephone:([phone] [phone]  Patient Care Team: Glean Hess, MD as PCP - General (Internal Medicine)   Name of the patient: Sarah Edwards  [id number]  November 27, 1990   Date of visit: 11/01/20  Diagnosis-B12 deficiency anemia  Chief complaint/ Reason for visit-routine follow-up of B12 deficiency  Heme/Onc history: Patient is a 30 year old female who has a history of iron and B12 deficiency anemia in 2018 during pregnancy.  Subsequently her anemia resolved but she continued to have low B12 levels for which she gets B12 injections.  Interval history-patient reports improvement in her energy levels after B12 injections and her last injection was in December 2021.  She states that the effect however does not last for more than a few days.  She is also busier at work and reports some fatigue and stress is secondary to that  ECOG PS- 0 Pain scale- 0   Review of systems- Review of Systems  Constitutional: Positive for malaise/fatigue. Negative for chills, fever and weight loss.  HENT: Negative for congestion, ear discharge and nosebleeds.   Eyes: Negative for blurred vision.  Respiratory: Negative for cough, hemoptysis, sputum production, shortness of breath and wheezing.   Cardiovascular: Negative for chest pain, palpitations, orthopnea and claudication.  Gastrointestinal: Negative for abdominal pain, blood in stool, constipation, diarrhea, heartburn, melena, nausea and vomiting.  Genitourinary: Negative for dysuria, flank pain, frequency, hematuria and urgency.  Musculoskeletal: Negative for back pain, joint pain and myalgias.  Skin: Negative for rash.  Neurological: Negative for dizziness, tingling, focal weakness, seizures, weakness and headaches.  Endo/Heme/Allergies: Does not bruise/bleed easily.  Psychiatric/Behavioral: Negative for depression and suicidal ideas. The patient does not have  insomnia.      Allergies  Allergen Reactions  . Azithromycin Swelling  . Amoxicillin Rash  . Penicillins Rash    Childhood reaction  . Sulfa Antibiotics Nausea And Vomiting    Constant vomiting     Past Medical History:  Diagnosis Date  . Abnormal uterine bleeding (AUB)   . Anemia   . Anemia during pregnancy in third trimester 03/21/2017  . Anxiety   . Chronic UTI   . Crohn disease ([place])   . Endometriosis   . IBS (irritable bowel syndrome)   . Painful menstrual periods   . SAB (spontaneous abortion) 01/06/2014     Past Surgical History:  Procedure Laterality Date  . COLONOSCOPY N/A 2010 and 2011   crohns  . ESOPHAGOGASTRODUODENOSCOPY ENDOSCOPY N/A 2010  . laproscopy  2012    Social History   Socioeconomic History  . Marital status: Married    Spouse name: Not on file  . Number of children: 2  . Years of education: Not on file  . 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 Smoking status: Former Smoker    Packs/day: 0.50    Years: 3.00    Pack years: 1.50    Types: Cigarettes    Quit date: 03/18/2015    Years since quitting: 5.6  . Smokeless tobacco: Never Used  Vaping Use  . Vaping Use: Never used  Substance and Sexual Activity  . Alcohol use: Yes    Comment: occasional   . Drug use: No  . Sexual activity: Yes    Birth control/protection: I.U.D.  Other Topics Concern  . Not on file  Social History Narrative   Lives in [place] with  husband and dtr.  Does not routinely exercise.   Social Determinants of Health   Financial Resource Strain: Not on file  Food Insecurity: Not on file  Transportation Needs: Not on file  Physical Activity: Not on file  Stress: Not on file  Social Connections: Not on file  Intimate Partner Violence: Not on file    Family History  Problem Relation Age of Onset  . Cancer Paternal Grandmother        breast   . Ovarian cancer Paternal  Grandmother   . Cervical cancer Paternal Grandmother   . Lung cancer Paternal Grandfather   . Leukemia Maternal Grandfather   . Cancer Maternal Grandfather   . Diabetes Mother   . Diabetes Father   . Cancer Paternal Aunt   . Breast cancer Maternal Grandmother   . Colon cancer Neg Hx      Current Outpatient Medications:  .  cyanocobalamin (,VITAMIN B-12,) 1000 MCG/ML injection, Inject 1 mL (1,000 mcg total) into the skin every 30 (thirty) days., Disp: 1 mL, Rfl: 11 .  levonorgestrel (MIRENA) 20 MCG/24HR IUD, 1 each by Intrauterine route once., Disp: , Rfl:  .  Syringe/Needle, Disp, (SYRINGE 3CC/25GX5/8") 25G X 5/8" 3 ML MISC, Patient needs 12 syringes and 5/8 needles, Disp: 1 each, Rfl: 0 .  meloxicam (MOBIC) 15 MG tablet, Take 1 tablet (15 mg total) by mouth daily. (Patient not taking: No sig reported), Disp: 30 tablet, Rfl: 0 .  valACYclovir (VALTREX) 500 MG tablet, Take 500 mg by mouth as needed. (Patient not taking: No sig reported), Disp: , Rfl:   Physical exam:  Vitals:   10/31/20 1441  BP: 128/75  Pulse: 88  Resp: 20  Temp: 99 F (37.2 C)  TempSrc: Tympanic  SpO2: 100%  Weight: 173 lb 4.8 oz (78.6 kg)   Physical Exam Constitutional:      General: She is not in acute distress. Eyes:     Extraocular Movements: EOM normal.  Pulmonary:     Effort: Pulmonary effort is normal.  Skin:    General: Skin is warm and dry.  Neurological:     Mental Status: She is alert and oriented to person, place, and time.      CMP Latest Ref Rng & Units 06/27/2020  Glucose 70 - 99 mg/dL 104(H)  BUN 6 - 20 mg/dL 10  Creatinine 0.44 - 1.00 mg/dL 0.76  Sodium 135 - 145 mmol/L 140  Potassium 3.5 - 5.1 mmol/L 3.6  Chloride 98 - 111 mmol/L 107  CO2 22 - 32 mmol/L 23  Calcium 8.9 - 10.3 mg/dL 9.0  Total Protein 6.5 - 8.1 g/dL 8.5(H)  Total Bilirubin 0.3 - 1.2 mg/dL 0.4  Alkaline Phos 38 - 126 U/L 69  AST 15 - 41 U/L 26  ALT 0 - 44 U/L 22   CBC Latest Ref Rng & Units 10/31/2020  WBC  4.0 - 10.5 K/uL 7.0  Hemoglobin 12.0 - 15.0 g/dL 13.4  Hematocrit 36.0 - 46.0 % 39.9  Platelets 150 - 400 K/uL 255     Assessment and plan- Patient is a 30 y.o. female with history of B12 deficiency here for routine follow-up  Patient is not currently anemic.  She does report improvement in her fatigue after B12 injections.  We will send B12 injections to her pharmacy so she can self administer monthly at home.  Repeat CBC and B12 in 6 months in 1 year and I will see her back in 1 year   Visit  Diagnosis 1. B12 deficiency      Dr. Randa Evens, MD, MPH [facility] at [facility] [phone] 11/01/2020 1:31 PM

## 2021-04-28 ENCOUNTER — Other Ambulatory Visit: Payer: Self-pay

## 2021-04-28 DIAGNOSIS — E538 Deficiency of other specified B group vitamins: Secondary | ICD-10-CM

## 2021-04-28 DIAGNOSIS — D509 Iron deficiency anemia, unspecified: Secondary | ICD-10-CM

## 2021-05-01 ENCOUNTER — Other Ambulatory Visit: Payer: Self-pay

## 2021-05-01 ENCOUNTER — Inpatient Hospital Stay: Payer: Managed Care, Other (non HMO) | Attending: Oncology

## 2021-05-01 ENCOUNTER — Inpatient Hospital Stay: Payer: Managed Care, Other (non HMO)

## 2021-05-01 DIAGNOSIS — E538 Deficiency of other specified B group vitamins: Secondary | ICD-10-CM | POA: Diagnosis present

## 2021-05-01 DIAGNOSIS — D509 Iron deficiency anemia, unspecified: Secondary | ICD-10-CM

## 2021-05-01 LAB — CBC WITH DIFFERENTIAL/PLATELET
Abs Immature Granulocytes: 0.02 10*3/uL (ref 0.00–0.07)
Basophils Absolute: 0 10*3/uL (ref 0.0–0.1)
Basophils Relative: 1 %
Eosinophils Absolute: 0.1 10*3/uL (ref 0.0–0.5)
Eosinophils Relative: 1 %
HCT: 39.2 % (ref 36.0–46.0)
Hemoglobin: 13.2 g/dL (ref 12.0–15.0)
Immature Granulocytes: 0 %
Lymphocytes Relative: 31 %
Lymphs Abs: 1.8 10*3/uL (ref 0.7–4.0)
MCH: 30.6 pg (ref 26.0–34.0)
MCHC: 33.7 g/dL (ref 30.0–36.0)
MCV: 90.7 fL (ref 80.0–100.0)
Monocytes Absolute: 0.4 10*3/uL (ref 0.1–1.0)
Monocytes Relative: 6 %
Neutro Abs: 3.6 10*3/uL (ref 1.7–7.7)
Neutrophils Relative %: 61 %
Platelets: 189 10*3/uL (ref 150–400)
RBC: 4.32 MIL/uL (ref 3.87–5.11)
RDW: 11.9 % (ref 11.5–15.5)
WBC: 5.9 10*3/uL (ref 4.0–10.5)
nRBC: 0 % (ref 0.0–0.2)

## 2021-05-02 LAB — VITAMIN B12: Vitamin B-12: 311 pg/mL (ref 180–914)

## 2021-08-23 ENCOUNTER — Encounter: Payer: Self-pay | Admitting: Oncology

## 2021-08-24 ENCOUNTER — Encounter: Payer: Self-pay | Admitting: Obstetrics and Gynecology

## 2021-08-24 ENCOUNTER — Other Ambulatory Visit (HOSPITAL_COMMUNITY)
Admission: RE | Admit: 2021-08-24 | Discharge: 2021-08-24 | Disposition: A | Discharge status: Home / Self Care | Payer: Managed Care, Other (non HMO) | Source: Ambulatory Visit | Attending: Obstetrics and Gynecology | Admitting: Obstetrics and Gynecology

## 2021-08-24 ENCOUNTER — Ambulatory Visit: Payer: Managed Care, Other (non HMO) | Admitting: Obstetrics and Gynecology

## 2021-08-24 ENCOUNTER — Other Ambulatory Visit: Payer: Self-pay

## 2021-08-24 VITALS — BP 114/76 | HR 78 | Ht 65.0 in | Wt 151.0 lb

## 2021-08-24 DIAGNOSIS — Z975 Presence of (intrauterine) contraceptive device: Secondary | ICD-10-CM

## 2021-08-24 DIAGNOSIS — Z809 Family history of malignant neoplasm, unspecified: Secondary | ICD-10-CM

## 2021-08-24 DIAGNOSIS — Z01419 Encounter for gynecological examination (general) (routine) without abnormal findings: Secondary | ICD-10-CM | POA: Diagnosis not present

## 2021-08-24 DIAGNOSIS — Z124 Encounter for screening for malignant neoplasm of cervix: Secondary | ICD-10-CM | POA: Diagnosis not present

## 2021-08-24 NOTE — Progress Notes (Signed)
GYNECOLOGY ANNUAL PHYSICAL EXAM PROGRESS NOTE  Subjective:    Sarah Edwards is a married 30 y.o. [phone] female who presents for an annual exam. Last seen at [facility] in 2019. The patient has no complaints today. The patient is sexually active. The patient participates in regular exercise: no. Has the patient ever been transfused or tattooed?: no. The patient reports that there is not domestic violence in her life.   Jewell has the following concerns:  Has family history of cancer, notes her maternal grandmother recently had bilateral mastectomy for breast cancer, age 68s. PGM with h/o breast and ovarian cancer. Wonders about her risk.  Has concerns about her IUD.  Notes she and her husband have been talking and he may be planning on getting a vasectomy. Wondering if she should remove it soon as it was inserted in 2018, and what her other options would be during interim.    Menstrual History: Menarche age: 21 No LMP recorded. (Menstrual status: IUD).    Gynecologic History:  Contraception: IUD History of STI's: Denies Last Pap: 08/21/2018. Results were: normal.  Denies h/o abnormal pap smears.     Upstream - 08/24/21 6222       Pregnancy Intention Screening   Does the patient want to become pregnant in the next year? No    Does the patient's partner want to become pregnant in the next year? No    Would the patient like to discuss contraceptive options today? Yes      Contraception Wrap Up   Current Method IUD or IUS    End Method IUD or IUS    Contraception Counseling Provided Yes            The pregnancy intention screening data noted above was reviewed. Potential methods of contraception were discussed. The patient elected to proceed with IUD or IUS.    OB History  Gravida Para Term Preterm AB Living  3 2 2  0 1 2  SAB IAB Ectopic Multiple Live Births  1 0 0 0 2    # Outcome Date GA Lbr Len/2nd Weight Sex Delivery Anes PTL Lv  3 Term 04/18/17 [redacted]w[redacted]d/ 00:14  7 lb 14.3 oz (3.58 kg) F Vag-Spont Local  LIV     Name: DKYMORAH, KORF    Apgar1: 8  Apgar5: 9  2 Term 09/17/09   6 lb 12 oz (3.062 kg) F Vag-Spont   LIV  1 SAB        N     Past Medical History:  Diagnosis Date   Abnormal uterine bleeding (AUB)    Anemia    Anemia during pregnancy in third trimester 03/21/2017   Anxiety    Chronic UTI    Crohn disease ([place]    Endometriosis    IBS (irritable bowel syndrome)    Painful menstrual periods    SAB (spontaneous abortion) 01/06/2014    Past Surgical History:  Procedure Laterality Date   ANKLE RECONSTRUCTION Right    COLONOSCOPY N/A 2010 and 2011   crohns   ESOPHAGOGASTRODUODENOSCOPY ENDOSCOPY N/A 2010   laproscopy  2012    Family History  Problem Relation Age of Onset   Cancer Paternal Grandmother        breast    Ovarian cancer Paternal Grandmother    Cervical cancer Paternal Grandmother    Lung cancer Paternal Grandfather    Leukemia Maternal Grandfather    Cancer Maternal Grandfather    Diabetes Mother  Diabetes Father    Cancer Paternal Aunt    Breast cancer Maternal Grandmother    Colon cancer Neg Hx     Social History   Socioeconomic History   Marital status: Married    Spouse name: Not on file   Number of children: 2   Years of education: Not on file   Highest education level: Not on file  Occupational History    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  Other Topics Concern   Not on file  Social History Narrative   Lives in [place] with husband and dtr.   Does not routinely exercise.   Social Determinants of Health   Financial Resource Strain: Not on file  Food Insecurity: Not on file  Transportation Needs: Not on file  Physical Activity: Not on file  Stress: Not on file  Social Connections: Not on file  Intimate Partner Violence: Not on file    Current Outpatient Medications on File Prior to Visit  Medication Sig Dispense Refill   levonorgestrel (MIRENA) 20 MCG/24HR IUD 1 each by Intrauterine route once.     cyanocobalamin (,VITAMIN B-12,) 1000 MCG/ML injection Inject 1 mL (1,000 mcg total) into the skin every 30 (thirty) days. (Patient not taking: Reported on 08/24/2021) 1 mL 11   meloxicam (MOBIC) 15 MG tablet Take 1 tablet (15 mg total) by mouth daily. (Patient not taking: Reported on 06/30/2020) 30 tablet 0   Syringe/Needle, Disp, (SYRINGE 3CC/25GX5/8") 25G X 5/8" 3 ML MISC Patient needs 12 syringes and 5/8 needles (Patient not taking: Reported on 08/24/2021) 1 each 0   valACYclovir (VALTREX) 500 MG tablet Take 500 mg by mouth as needed. (Patient not taking: Reported on 06/30/2020)     No current facility-administered medications on file prior to visit.    Allergies  Allergen Reactions   Azithromycin Swelling   Amoxicillin Rash   Penicillins Rash    Childhood reaction   Sulfa Antibiotics Nausea And Vomiting    Constant vomiting     Review of Systems Constitutional: negative for chills, fatigue, fevers and sweats Eyes: negative for irritation, redness and visual disturbance Ears, nose, mouth, throat, and face: negative for hearing loss, nasal congestion, snoring and tinnitus Respiratory: negative for asthma, cough, sputum Cardiovascular: negative for chest pain, dyspnea, exertional chest pressure/discomfort, irregular heart beat, palpitations and syncope Gastrointestinal: negative for abdominal pain, change in bowel habits, nausea and vomiting Genitourinary: negative for abnormal menstrual periods, genital lesions, sexual  problems and vaginal discharge, dysuria and urinary incontinence Integument/breast: negative for breast lump, breast tenderness and nipple discharge Hematologic/lymphatic: negative for bleeding and easy bruising Musculoskeletal:negative for back pain and muscle weakness Neurological: negative for dizziness, headaches, vertigo and weakness Endocrine: negative for diabetic symptoms including polydipsia, polyuria and skin dryness Allergic/Immunologic: negative for hay fever and urticaria      Objective:  Blood pressure (!) 89/54, pulse 89, height 5' 5"  (1.651 m), weight  151 lb (68.5 kg). Body mass index is 25.13 kg/m.  General Appearance:    Alert, cooperative, no distress, appears stated age  Head:    Normocephalic, without obvious abnormality, atraumatic  Eyes:    PERRL, conjunctiva/corneas clear, EOM's intact, both eyes  Ears:    Normal external ear canals, both ears  Nose:   Nares normal, septum midline, mucosa normal, no drainage or sinus tenderness  Throat:   Lips, mucosa, and tongue normal; teeth and gums normal  Neck:   Supple, symmetrical, trachea midline, no adenopathy; thyroid: no enlargement/tenderness/nodules; no carotid bruit or JVD  Back:     Symmetric, no curvature, ROM normal, no CVA tenderness  Lungs:     Clear to auscultation bilaterally, respirations unlabored  Chest Wall:    No tenderness or deformity   Heart:    Regular rate and rhythm, S1 and S2 normal, no murmur, rub or gallop  Breast Exam:    No tenderness, masses, or nipple abnormality  Abdomen:     Soft, non-tender, bowel sounds active all four quadrants, no masses, no organomegaly.    Genitalia:    Pelvic:external genitalia normal, vagina without lesions, discharge, or tenderness, rectovaginal septum  normal. Cervix normal in appearance, no cervical motion tenderness, no adnexal masses or tenderness.  Uterus normal size, shape, mobile, regular contours, nontender.  Rectal:    Normal external sphincter.  No  hemorrhoids appreciated. Internal exam not done.   Extremities:   Extremities normal, atraumatic, no cyanosis or edema  Pulses:   2+ and symmetric all extremities  Skin:   Skin color, texture, turgor normal, no rashes or lesions  Lymph nodes:   Cervical, supraclavicular, and axillary nodes normal  Neurologic:   CNII-XII intact, normal strength, sensation and reflexes throughout   .  Labs:  Lab Results  Component Value Date   WBC 5.9 05/01/2021   HGB 13.2 05/01/2021   HCT 39.2 05/01/2021   MCV 90.7 05/01/2021   PLT 189 05/01/2021    Lab Results  Component Value Date   CREATININE 0.76 06/27/2020   BUN 10 06/27/2020   NA 140 06/27/2020   K 3.6 06/27/2020   CL 107 06/27/2020   CO2 23 06/27/2020    Lab Results  Component Value Date   ALT 22 06/27/2020   AST 26 06/27/2020   ALKPHOS 69 06/27/2020   BILITOT 0.4 06/27/2020    Lab Results  Component Value Date   TSH 2.767 10/31/2020     Assessment:   1. Well woman exam with routine gynecological exam   2. Pap smear for cervical cancer screening   3. Family history of cancer   4. IUD (intrauterine device) in place      Plan:  Blood tests: None ordered. Recent labs reviewed. . Breast self exam technique reviewed and patient encouraged to perform self-exam monthly. Contraception: IUD. Discussed that IUD can remain in place for up to 8 years. Patient ok to do so. Discussed healthy lifestyle modifications. Mammogram discussed. To begin screenings at age 45 unless otherwise indicated by genetic screening. Pap smear ordered. Offered hereditary cancer screening which patient agrees.  Inv[employer]ae testing ordered.  COVID vaccination status: declines Flu vaccine: up to date.  Follow up in 1 year for annual exam   Rubie Maid, MD [facility]

## 2021-08-25 LAB — CYTOLOGY - PAP: Diagnosis: NEGATIVE

## 2021-08-31 ENCOUNTER — Telehealth: Payer: Self-pay | Admitting: Obstetrics and Gynecology

## 2021-08-31 NOTE — Telephone Encounter (Signed)
Shella called from [facility]- states that they received a sample for this pt but no test order. Her call back # [phone]. Please Advise.

## 2021-09-01 ENCOUNTER — Encounter: Payer: Self-pay | Admitting: Obstetrics and Gynecology

## 2021-09-04 NOTE — Telephone Encounter (Signed)
I have contacted the rep and completed the order form.

## 2021-10-06 ENCOUNTER — Telehealth: Payer: Managed Care, Other (non HMO) | Admitting: Obstetrics and Gynecology

## 2021-10-06 ENCOUNTER — Encounter: Payer: Self-pay | Admitting: Obstetrics and Gynecology

## 2021-10-06 VITALS — Ht 65.0 in | Wt 145.0 lb

## 2021-10-06 DIAGNOSIS — Z1371 Encounter for nonprocreative screening for genetic disease carrier status: Secondary | ICD-10-CM | POA: Diagnosis not present

## 2021-10-06 DIAGNOSIS — Z809 Family history of malignant neoplasm, unspecified: Secondary | ICD-10-CM | POA: Diagnosis not present

## 2021-10-06 NOTE — Progress Notes (Signed)
Virtual Visit via Video Note  I connected with Sarah Edwards on 10/06/21 at  1:30 PM EST by a video enabled telemedicine application and verified that I am speaking with the correct person using two identifiers.  Location: Patient: Work Provider: Office   I discussed the limitations of evaluation and management by telemedicine and the availability of in person appointments. The patient expressed understanding and agreed to proceed.  History of Present Illness: Sarah Edwards is a 31 y.o. [phone] female who presents for discussion of test results.  Patient has a family history significant for breast and ovarian cancer.  Had [facility] genetic screening performed.   Observations/Objective: Height 5' 5"  (1.651 m), weight 145 lb (65.8 kg). No LMP recorded. (Menstrual status: IUD). Gen App: :NAD Remainder of exam deferred.     Labs:  [facility] genetic testing performed 08/27/2021.  Variant(s) of uncertain significance identified.  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  Gene: POLE.  Variant C.43 180A>G, heterozygous   Assessment and Plan:  Family history of cancer -discussed testing results with patient.  Discussed that at this time these variants are not completely understood.  Advised that the classification of these variants may change over time as a result of new information or interpretation.  If these genes are implicated in potential cancers, then further management would be recommended.  Advised that she is recommended to also discuss further with a genetic counselor for further information.  As of now, we will continue to monitor and manage according to current screening and management guidelines.  Follow Up Instructions:  Return to clinic for any scheduled appointments or for any gynecologic concerns as needed.   I discussed the assessment and treatment plan with the patient. The patient was provided an opportunity to ask questions and all were answered. The patient  agreed with the plan and demonstrated an understanding of the instructions.   The patient was advised to call back or seek an in-person evaluation if the symptoms worsen or if the condition fails to improve as anticipated.  I provided 8 minutes of non-face-to-face time during this encounter.   Rubie Maid, MD [facility]

## 2021-10-31 ENCOUNTER — Inpatient Hospital Stay: Payer: Managed Care, Other (non HMO)

## 2021-10-31 ENCOUNTER — Inpatient Hospital Stay: Payer: Managed Care, Other (non HMO) | Admitting: Oncology

## 2021-11-20 ENCOUNTER — Other Ambulatory Visit: Payer: Self-pay

## 2021-11-20 ENCOUNTER — Inpatient Hospital Stay: Payer: Managed Care, Other (non HMO) | Attending: Oncology

## 2021-11-20 ENCOUNTER — Encounter: Payer: Self-pay | Admitting: Oncology

## 2021-11-20 ENCOUNTER — Inpatient Hospital Stay: Payer: Managed Care, Other (non HMO) | Admitting: Oncology

## 2021-11-20 ENCOUNTER — Inpatient Hospital Stay: Payer: Managed Care, Other (non HMO)

## 2021-11-20 VITALS — BP 118/71 | HR 77 | Temp 98.6°F | Resp 18 | Wt 145.2 lb

## 2021-11-20 DIAGNOSIS — E538 Deficiency of other specified B group vitamins: Secondary | ICD-10-CM

## 2021-11-20 DIAGNOSIS — Z803 Family history of malignant neoplasm of breast: Secondary | ICD-10-CM | POA: Diagnosis not present

## 2021-11-20 DIAGNOSIS — Z800 Family history of malignant neoplasm of digestive organs: Secondary | ICD-10-CM | POA: Diagnosis not present

## 2021-11-20 DIAGNOSIS — D509 Iron deficiency anemia, unspecified: Secondary | ICD-10-CM

## 2021-11-20 DIAGNOSIS — Z801 Family history of malignant neoplasm of trachea, bronchus and lung: Secondary | ICD-10-CM | POA: Insufficient documentation

## 2021-11-20 DIAGNOSIS — Z79899 Other long term (current) drug therapy: Secondary | ICD-10-CM | POA: Diagnosis not present

## 2021-11-20 DIAGNOSIS — R5383 Other fatigue: Secondary | ICD-10-CM | POA: Diagnosis not present

## 2021-11-20 LAB — CBC WITH DIFFERENTIAL/PLATELET
Abs Immature Granulocytes: 0.02 10*3/uL (ref 0.00–0.07)
Basophils Absolute: 0 10*3/uL (ref 0.0–0.1)
Basophils Relative: 1 %
Eosinophils Absolute: 0.1 10*3/uL (ref 0.0–0.5)
Eosinophils Relative: 1 %
HCT: 39.1 % (ref 36.0–46.0)
Hemoglobin: 12.8 g/dL (ref 12.0–15.0)
Immature Granulocytes: 0 %
Lymphocytes Relative: 30 %
Lymphs Abs: 1.9 10*3/uL (ref 0.7–4.0)
MCH: 29.8 pg (ref 26.0–34.0)
MCHC: 32.7 g/dL (ref 30.0–36.0)
MCV: 90.9 fL (ref 80.0–100.0)
Monocytes Absolute: 0.3 10*3/uL (ref 0.1–1.0)
Monocytes Relative: 4 %
Neutro Abs: 4.1 10*3/uL (ref 1.7–7.7)
Neutrophils Relative %: 64 %
Platelets: 227 10*3/uL (ref 150–400)
RBC: 4.3 MIL/uL (ref 3.87–5.11)
RDW: 11.9 % (ref 11.5–15.5)
WBC: 6.4 10*3/uL (ref 4.0–10.5)
nRBC: 0 % (ref 0.0–0.2)

## 2021-11-20 LAB — VITAMIN B12: Vitamin B-12: 362 pg/mL (ref 180–914)

## 2021-11-20 NOTE — Progress Notes (Signed)
? ? ? ?Hematology/Oncology Consult note ?[place]  ?[phone] [phone] ? ?Patient Care Team: ?Glean Hess, MD as PCP - General (Internal Medicine) ?Sindy Guadeloupe, MD as Consulting Physician (Hematology and Oncology)  ? ?Name of the patient: Sarah Edwards  ?[id number]  ?[birth year]  ? ?Date of visit: 11/20/21 ? ?Diagnosis- B12 deficiency anemia ?  ? ?Chief complaint/ Reason for visit- routine f/ of iron de ? ?Heme/Onc history: Patient is a 31 year old female who has a history of iron and B12 deficiency anemia in 2018 during pregnancy.  Subsequently her anemia resolved but she continued to have low B12 levels for which she gets B12 injections. ?  ? ?Interval history- reports ongoing fatigue. Denies other complaints ? ?ECOG PS- 0 ?Pain scale- 0 ? ? ?Review of systems- Review of Systems  ?Constitutional:  Positive for malaise/fatigue. Negative for chills, fever and weight loss.  ?HENT:  Negative for congestion, ear discharge and nosebleeds.   ?Eyes:  Negative for blurred vision.  ?Respiratory:  Negative for cough, hemoptysis, sputum production, shortness of breath and wheezing.   ?Cardiovascular:  Negative for chest pain, palpitations, orthopnea and claudication.  ?Gastrointestinal:  Negative for abdominal pain, blood in stool, constipation, diarrhea, heartburn, melena, nausea and vomiting.  ?Genitourinary:  Negative for dysuria, flank pain, frequency, hematuria and urgency.  ?Musculoskeletal:  Negative for back pain, joint pain and myalgias.  ?Skin:  Negative for rash.  ?Neurological:  Negative for dizziness, tingling, focal weakness, seizures, weakness and headaches.  ?Endo/Heme/Allergies:  Does not bruise/bleed easily.  ?Psychiatric/Behavioral:  Negative for depression and suicidal ideas. The patient does not have insomnia.    ? ? ?Allergies  ?Allergen Reactions  ? Azithromycin Swelling  ? Amoxicillin Rash  ? Penicillins Rash  ?  Childhood reaction  ? Sulfa  Antibiotics Nausea And Vomiting  ?  Constant vomiting  ? ? ? ?Past Medical History:  ?Diagnosis Date  ? Abnormal uterine bleeding (AUB)   ? Anemia   ? Anemia during pregnancy in third trimester 03/21/2017  ? Anxiety   ? Chronic UTI   ? Crohn disease ([place])   ? Endometriosis   ? IBS (irritable bowel syndrome)   ? Painful menstrual periods   ? SAB (spontaneous abortion) 01/06/2014  ? ? ? ?Past Surgical History:  ?Procedure Laterality Date  ? ANKLE RECONSTRUCTION Right   ? COLONOSCOPY N/A 2010 and 2011  ? crohns  ? ESOPHAGOGASTRODUODENOSCOPY ENDOSCOPY N/A 2010  ? laproscopy  2012  ? ? ?Social History  ? ?Socioeconomic History  ? Marital status: Married  ?  Spouse name: Not on file  ? Number of children: 2  ? Years of education: Not on file  ? Highest education level: Not on file  ?Occupational History  ?  Comment: Secondary school teacher - [employer]  ?Tobacco Use  ? Smoking status: Former  ?  Packs/day: 0.50  ?  Years: 3.00  ?  Pack years: 1.50  ?  Types: Cigarettes  ?  Quit date: 03/18/2015  ?  Years since quitting: 6.6  ? Smokeless tobacco: Never  ?Vaping Use  ? Vaping Use: Every day  ? Substances: THC  ? Devices: THC Oil  ?Substance and Sexual Activity  ? Alcohol use: Not Currently  ? Drug use: Yes  ?  Types: Marijuana  ?  Comment: oil  ? Sexual activity: Yes  ?  Birth control/protection: I.U.D.  ?Other Topics Concern  ? Not on file  ?Social History Narrative  ? Lives in  [place] with husband and dtr.  Does not routinely exercise.  ? ?Social Determinants of Health  ? ?Financial Resource Strain: Not on file  ?Food Insecurity: Not on file  ?Transportation Needs: Not on file  ?Physical Activity: Not on file  ?Stress: Not on file  ?Social Connections: Not on file  ?Intimate Partner Violence: Not on file  ? ? ?Family History  ?Problem Relation Age of Onset  ? Cancer Paternal Grandmother   ?     breast   ? Ovarian cancer Paternal Grandmother   ? Cervical cancer Paternal Grandmother   ? Lung cancer Paternal Grandfather   ? Leukemia  Maternal Grandfather   ? Cancer Maternal Grandfather   ? Diabetes Mother   ? Diabetes Father   ? Cancer Paternal Aunt   ? Breast cancer Maternal Grandmother   ? Colon cancer Neg Hx   ? ? ? ?Current Outpatient Medications:  ?  levonorgestrel (MIRENA) 20 MCG/24HR IUD, 1 each by Intrauterine route once., Disp: , Rfl:  ? ?Physical exam:  ?Vitals:  ? 11/20/21 1027  ?BP: 118/71  ?Pulse: 77  ?Resp: 18  ?Temp: 98.6 ?F (37 ?C)  ?SpO2: 100%  ?Weight: 145 lb 3.2 oz (65.9 kg)  ? ?Physical Exam ?Cardiovascular:  ?   Rate and Rhythm: Normal rate and regular rhythm.  ?   Heart sounds: Normal heart sounds.  ?Pulmonary:  ?   Effort: Pulmonary effort is normal.  ?   Breath sounds: Normal breath sounds.  ?Abdominal:  ?   General: Bowel sounds are normal.  ?   Palpations: Abdomen is soft.  ?Skin: ?   General: Skin is warm and dry.  ?Neurological:  ?   Mental Status: She is alert and oriented to person, place, and time.  ?  ? ?CMP Latest Ref Rng & Units 06/27/2020  ?Glucose 70 - 99 mg/dL 104(H)  ?BUN 6 - 20 mg/dL 10  ?Creatinine 0.44 - 1.00 mg/dL 0.76  ?Sodium 135 - 145 mmol/L 140  ?Potassium 3.5 - 5.1 mmol/L 3.6  ?Chloride 98 - 111 mmol/L 107  ?CO2 22 - 32 mmol/L 23  ?Calcium 8.9 - 10.3 mg/dL 9.0  ?Total Protein 6.5 - 8.1 g/dL 8.5(H)  ?Total Bilirubin 0.3 - 1.2 mg/dL 0.4  ?Alkaline Phos 38 - 126 U/L 69  ?AST 15 - 41 U/L 26  ?ALT 0 - 44 U/L 22  ? ?CBC Latest Ref Rng & Units 11/20/2021  ?WBC 4.0 - 10.5 K/uL 6.4  ?Hemoglobin 12.0 - 15.0 g/dL 12.8  ?Hematocrit 36.0 - 46.0 % 39.1  ?Platelets 150 - 400 K/uL 227  ? ? ? ?Assessment and plan- Patient is a 31 y.o. female with iron and b12 deficiency anemia  ? ?Patient has not required any IV iron for a long time now.  She self administers B12 injections once a month and her levels presently are pending.  She is not anemic and her hemoglobin is remained stable between 12-13 over the last 2 years.  She therefore does not require follow-up with hematology at this time and can continue to follow-up  with a primary care doctor and get her labs checked every 6 months to 1 year.  She can Be referred to [place] in the future if questions or concerns arise ?  ?Visit Diagnosis ?1. Iron deficiency anemia, unspecified iron deficiency anemia type   ?2. B12 deficiency   ? ? ? ?Dr. Randa Evens, MD, MPH ?[facility] ?[phone] ?11/20/2021 ?1:06 PM ? ? ? ? ? ? ?    ? ? ? ? ? ?

## 2021-11-23 ENCOUNTER — Ambulatory Visit: Payer: Managed Care, Other (non HMO) | Admitting: Internal Medicine

## 2021-11-23 ENCOUNTER — Other Ambulatory Visit: Payer: Self-pay

## 2021-11-23 ENCOUNTER — Encounter: Payer: Self-pay | Admitting: Internal Medicine

## 2021-11-23 VITALS — BP 110/63 | HR 87 | Temp 97.5°F | Ht 65.0 in | Wt 145.0 lb

## 2021-11-23 DIAGNOSIS — K219 Gastro-esophageal reflux disease without esophagitis: Secondary | ICD-10-CM

## 2021-11-23 DIAGNOSIS — B009 Herpesviral infection, unspecified: Secondary | ICD-10-CM | POA: Diagnosis not present

## 2021-11-23 DIAGNOSIS — K5090 Crohn's disease, unspecified, without complications: Secondary | ICD-10-CM

## 2021-11-23 DIAGNOSIS — D518 Other vitamin B12 deficiency anemias: Secondary | ICD-10-CM | POA: Diagnosis not present

## 2021-11-23 DIAGNOSIS — J00 Acute nasopharyngitis [common cold]: Secondary | ICD-10-CM

## 2021-11-23 DIAGNOSIS — F419 Anxiety disorder, unspecified: Secondary | ICD-10-CM

## 2021-11-23 MED ORDER — DOXYCYCLINE HYCLATE 100 MG PO TABS: 100.0000 mg | ORAL_TABLET | Freq: Two times a day (BID) | ORAL | 0 refills | Status: DC

## 2021-11-23 NOTE — Assessment & Plan Note (Signed)
Managed with diet, no medications ?We will monitor ?

## 2021-11-23 NOTE — Assessment & Plan Note (Signed)
Support offered ?We will monitor ?

## 2021-11-23 NOTE — Assessment & Plan Note (Signed)
CBC reviewed ?Continue B12 injections ?

## 2021-11-23 NOTE — Assessment & Plan Note (Signed)
Currently not taking any medications for this ?

## 2021-11-23 NOTE — Progress Notes (Signed)
HPI  Patient presents to clinic today to establish care and for management of the conditions listed below.  GERD/Crohn's: Intermittent epigastric pain, vomiting and loose stools.  She manages this with diet.  She is not following with GI.  Endometriosis: Resolved status post laparoscopic surgery.  She has an IUD.  She does not take anything OTC for pain.  Anxiety: Intermittent.  She is not currently taking any medications for this.  She is not seeing a therapist.  She denies depression, SI/HI.  B12 Deficiency Anemia: She continues self injection of B12 shots.  Her last H/H was 12.8/39.1, 11/2021.  Her last B12 level was 362, 11/2021.  She is not currently following with hematology.  She also reports runny nose and cough.  She reports this started 1 month ago.  She is blowing yellow mucus out of her nose.  The cough is productive of yellow mucus.  She denies headache, dizziness, nasal congestion, ear pain, sore throat, chest pain, shortness of breath, nausea, vomiting or diarrhea.  She has intermittently ran fevers but denies chills or body aches.  She has taken Aleve D, Claritin and nasal spray OTC with minimal relief of symptoms.  She has had 2 negative COVID test at home.  Past Medical History:  Diagnosis Date   Abnormal uterine bleeding (AUB)    Anemia    Anemia during pregnancy in third trimester 03/21/2017   Anxiety    Bright red rectal bleeding 08/19/2018   Chronic UTI    Crohn disease ([facility])    Endometriosis    IBS (irritable bowel syndrome)    Painful menstrual periods    SAB (spontaneous abortion) 01/06/2014    Current Outpatient Medications  Medication Sig Dispense Refill   levonorgestrel (MIRENA) 20 MCG/24HR IUD 1 each by Intrauterine route once.     No current facility-administered medications for this visit.    Allergies  Allergen Reactions   Azithromycin Swelling   Amoxicillin Rash   Penicillins Rash    Childhood reaction   Sulfa Antibiotics Nausea And Vomiting     Constant vomiting    Family History  Problem Relation Age of Onset   Diabetes Mother    Hypertension Mother    Diabetes Father    Heart disease Father    Hypertension Father    Breast cancer Maternal Grandmother    Leukemia Maternal Grandfather    Cancer Maternal Grandfather    Breast cancer Paternal Grandmother    Ovarian cancer Paternal Grandmother    Cervical cancer Paternal Grandmother    Lung cancer Paternal Grandfather    Cancer Paternal Aunt    Colon cancer Neg Hx     Social History   Socioeconomic History   Marital status: Married    Spouse name: Not on file   Number of children: 2   Years of education: Not on file   Highest education level: Not on file  Occupational History    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  Other Topics Concern   Not on file  Social History Narrative   Lives in [place] with husband and dtr.  Does not routinely exercise.   Social Determinants of Health   Financial Resource Strain: Not on file  Food Insecurity: Not on file  Transportation Needs: Not on file  Physical Activity: Not on file  Stress: Not on file  Social Connections: Not on file  Intimate Partner Violence: Not on file    ROS:  Constitutional: Denies fever, malaise, fatigue, headache or abrupt weight changes.  HEENT: Patient reports runny nose.  Denies eye pain, eye redness, ear pain, ringing in the ears, wax buildup, nasal congestion, bloody nose, or sore throat. Respiratory: Patient reports cough.  Denies difficulty  breathing, shortness of breath.   Cardiovascular: Denies chest pain, chest tightness, palpitations or swelling in the hands or feet.  Gastrointestinal: Patient reports intermittent abdominal pain, vomiting and diarrhea.  Denies bloating, constipation, or blood in the stool.  GU: Denies frequency, urgency, pain with urination, blood in urine, odor or discharge. Musculoskeletal: Denies decrease in range of motion, difficulty with gait, muscle pain or joint pain and swelling.  Skin: Denies redness, rashes, lesions or ulcercations.  Neurological: Denies dizziness, difficulty with memory, difficulty with speech or problems with balance and coordination.  Psych: Patient has a history of anxiety.  Denies depression, SI/HI.  No other specific complaints in a complete review of systems (except as listed in HPI above).  PE:  BP 110/63 (BP Location: Right Arm, Patient Position: Sitting, Cuff Size: Normal)    Pulse 87    Temp (!) 97.5 F (36.4 C) (Temporal)    Ht 5' 5"  (1.651 m)    Wt 145 lb (65.8 kg)    SpO2 99%    BMI 24.13 kg/m  Wt Readings from Last 3 Encounters:  11/23/21 145 lb (65.8 kg)  11/20/21 145 lb 3.2 oz (65.9 kg)  10/06/21 145 lb (65.8 kg)    General: Appears her stated age, well developed, well nourished in NAD. HEENT: Head: normal shape and size, no sinus tenderness noted; Eyes: sclera white, no icterus, conjunctiva pink, PERRLA and EOMs intact;  Neck: Anterior cervical adenopathy noted on the left. Cardiovascular: Normal rate and rhythm. S1,S2 noted.  No murmur, rubs or gallops noted.  Pulmonary/Chest: Normal effort and positive vesicular breath sounds. No respiratory distress. No wheezes, rales or ronchi noted.  Abdomen: Normal bowel sounds. Musculoskeletal: No difficulty with gait.  Neurological: Alert and oriented. Psychiatric: Mood and affect normal. Behavior is normal. Judgment and thought content normal.     BMET    Component Value Date/Time   NA 140 06/27/2020 1556    NA 138 09/18/2014 1609   K 3.6 06/27/2020 1556   K 3.5 09/18/2014 1609   CL 107 06/27/2020 1556   CL 102 09/18/2014 1609   CO2 23 06/27/2020 1556   CO2 26 09/18/2014 1609   GLUCOSE 104 (H) 06/27/2020 1556   GLUCOSE 96 09/18/2014 1609   BUN 10 06/27/2020 1556   BUN 5 (L) 09/18/2014 1609   CREATININE 0.76 06/27/2020 1556   CREATININE 0.80 09/18/2014 1609   CALCIUM 9.0 06/27/2020 1556   CALCIUM 8.7 09/18/2014 1609   GFRNONAA >60 06/27/2020 1556   GFRNONAA >60 09/18/2014 1609   GFRNONAA >60 01/19/2012 1125   GFRAA >60 08/21/2018 1317   GFRAA >60 09/18/2014 1609   GFRAA >60 01/19/2012 1125    Lipid Panel  Component Value Date/Time   CHOL 143 09/01/2015 1202   TRIG 42 09/01/2015 1202   HDL 65 09/01/2015 1202   CHOLHDL 2.2 09/01/2015 1202   LDLCALC 70 09/01/2015 1202    CBC    Component Value Date/Time   WBC 6.4 11/20/2021 1007   RBC 4.30 11/20/2021 1007   HGB 12.8 11/20/2021 1007   HGB 8.8 (L) 03/05/2017 0949   HCT 39.1 11/20/2021 1007   HCT 29.3 (L) 03/05/2017 0949   PLT 227 11/20/2021 1007   PLT 234 08/31/2016 0926   MCV 90.9 11/20/2021 1007   MCV 78 (L) 08/31/2016 0926   MCV 79 (L) 09/18/2014 1609   MCH 29.8 11/20/2021 1007   MCHC 32.7 11/20/2021 1007   RDW 11.9 11/20/2021 1007   RDW 15.6 (H) 08/31/2016 0926   RDW 14.5 09/18/2014 1609   LYMPHSABS 1.9 11/20/2021 1007   LYMPHSABS 1.2 08/31/2016 0926   LYMPHSABS 1.4 09/18/2014 1609   MONOABS 0.3 11/20/2021 1007   MONOABS 0.4 09/18/2014 1609   EOSABS 0.1 11/20/2021 1007   EOSABS 0.0 08/31/2016 0926   EOSABS 0.0 09/18/2014 1609   BASOSABS 0.0 11/20/2021 1007   BASOSABS 0.0 08/31/2016 0926   BASOSABS 0.0 09/18/2014 1609    Hgb A1C Lab Results  Component Value Date   HGBA1C 5.6 09/01/2015     Assessment and Plan:  Upper Respiratory Infection:  Encourage rest and fluids Rx for Doxycycline 100 mg twice daily x10 days Continue Claritin and nasal spray OTC  RTC in 6 months, follow up chronic  conditions  Webb Silversmith, NP This visit occurred during the SARS-CoV-2 public health emergency.  Safety protocols were in place, including screening questions prior to the visit, additional usage of staff PPE, and extensive cleaning of exam room while observing appropriate contact time as indicated for disinfecting solutions.

## 2021-11-23 NOTE — Patient Instructions (Signed)

## 2021-11-27 ENCOUNTER — Ambulatory Visit: Payer: Self-pay | Admitting: *Deleted

## 2021-11-27 NOTE — Telephone Encounter (Signed)
? ?  Summary: Advice for nausea while talking doxycline  ? Pt is calling to report that She has been vomiting and nauseous for 3 days while taking doxycycline (VIBRA-TABS) 100 MG tablet [[id number]] . Pt thought that she did not eat enough food. But the nauseousness and vomiting has continued. To the point, that the patient was unable to go to work today. Pt would like to know what she can do?  ?  ? ? ? ?Chief Complaint: N/V after taking doxycyline  ?Symptoms: vomiting x 1 this am after taking doxycycline since 11/25/21. C/o nausea every day since starting doxycyline  ?Frequency: nausea since 11/25/21 vomiting today  ?Pertinent Negatives: Patient denies other medication reactions.  ?Disposition: [] ED /[] Urgent Care (no appt availability in office) / [] Appointment(In office/virtual)/ []  [place] [facility]/ [] Home Care/ [] Refused Recommended Disposition /[] [place] [facility]/ [x]  Follow-up with PCP ?Additional Notes:  ? ?Please advise regarding taking doxycyline. Patient started course on 11/25/21. Please advise if alternative medication needed.  ? ? ? ?Reason for Disposition ? Vomiting a prescription medication ? ?Answer Assessment - Initial Assessment Questions ?1. VOMITING SEVERITY: "How many times have you vomited in the past 24 hours?"  ?   - MILD:  1 - 2 times/day ?   - MODERATE: 3 - 5 times/day, decreased oral intake without significant weight loss or symptoms of dehydration ?   - SEVERE: 6 or more times/day, vomits everything or nearly everything, with significant weight loss, symptoms of dehydration  ?    X 1 this am after taking doxycyline  ?2. ONSET: "When did the vomiting begin?"  ?    This am  ?3. FLUIDS: "What fluids or food have you vomited up today?" "Have you been able to keep any fluids down?" ?    na ?4. ABDOMINAL PAIN: "Are your having any abdominal pain?" If yes : "How bad is it and what does it feel like?" (e.g., crampy, dull, intermittent, constant)  ?    na ?5. DIARRHEA: "Is there any  diarrhea?" If Yes, ask: "How many times today?"  ?    na ?6. CONTACTS: "Is there anyone else in the family with the same symptoms?"  ?    na ?7. CAUSE: "What do you think is causing your vomiting?" ?    Medication doxycyline  ?8. HYDRATION STATUS: "Any signs of dehydration?" (e.g., dry mouth [not only dry lips], too weak to stand) "When did you last urinate?" ?    na ?9. OTHER SYMPTOMS: "Do you have any other symptoms?" (e.g., fever, headache, vertigo, vomiting blood or coffee grounds, recent head injury) ?    Nausea while on medication and this am vomiting  ?10. PREGNANCY: "Is there any chance you are pregnant?" "When was your last menstrual period?" ?      na ? ?Protocols used: Vomiting-A-AH ? ?

## 2021-11-28 NOTE — Telephone Encounter (Signed)
Pt advised.  She states she does not need Zofran at this time, and will take Zyrtec and Flonase.  ? ?Thanks,  ? ?-Mickel Baas  ?

## 2021-11-28 NOTE — Telephone Encounter (Signed)
I would have her stop doxycycline.  I can send her in Zofran if she needs it.  Would recommend Zyrtec and Flonase OTC for her sinus symptoms. ?

## 2022-01-19 ENCOUNTER — Encounter: Payer: Self-pay | Admitting: Internal Medicine

## 2022-01-19 ENCOUNTER — Ambulatory Visit (INDEPENDENT_AMBULATORY_CARE_PROVIDER_SITE_OTHER): Payer: Managed Care, Other (non HMO) | Admitting: Internal Medicine

## 2022-01-19 VITALS — BP 116/78 | HR 91 | Temp 97.8°F | Wt 140.0 lb

## 2022-01-19 DIAGNOSIS — H9201 Otalgia, right ear: Secondary | ICD-10-CM

## 2022-01-19 DIAGNOSIS — H5712 Ocular pain, left eye: Secondary | ICD-10-CM | POA: Diagnosis not present

## 2022-01-19 DIAGNOSIS — H5789 Other specified disorders of eye and adnexa: Secondary | ICD-10-CM | POA: Diagnosis not present

## 2022-01-19 NOTE — Progress Notes (Signed)
? ?Subjective:  ? ? Patient ID: Sarah Edwards, female    DOB: 1991-08-01, 31 y.o.   MRN: [id number] ? ?HPI ? ?Patient presents to clinic today with complaint of left eye pain, redness and drainage.  She noticed this 3-4 days ago. She has some crusting, first thing in the morning.  She reports her daughter was recently diagnosed with bacterial conjunctivitis and she has been using some drops with improvement in symptoms.. ? ?She also c/o right ear pain. She describes the pain as sharp and stabbing. She feels like she has drainage from her right ear. She reports the discharge is thin and clear. She has had some muffled hearing.  She reports history of chronic ear problems as a child.  She has not taken anything OTC for this. ? ?Review of Systems ? ?   ?Past Medical History:  ?Diagnosis Date  ? Abnormal uterine bleeding (AUB)   ? Anemia   ? Anemia during pregnancy in third trimester 03/21/2017  ? Anxiety   ? Bright red rectal bleeding 08/19/2018  ? Chronic UTI   ? Crohn disease ([place])   ? Endometriosis   ? IBS (irritable bowel syndrome)   ? Painful menstrual periods   ? SAB (spontaneous abortion) 01/06/2014  ? ? ?Current Outpatient Medications  ?Medication Sig Dispense Refill  ? doxycycline (VIBRA-TABS) 100 MG tablet Take 1 tablet (100 mg total) by mouth 2 (two) times daily. 20 tablet 0  ? levonorgestrel (MIRENA) 20 MCG/24HR IUD 1 each by Intrauterine route once.    ? ?No current facility-administered medications for this visit.  ? ? ?Allergies  ?Allergen Reactions  ? Azithromycin Swelling  ? Amoxicillin Rash  ? Penicillins Rash  ?  Childhood reaction  ? Sulfa Antibiotics Nausea And Vomiting  ?  Constant vomiting  ? ? ?Family History  ?Problem Relation Age of Onset  ? Diabetes Mother   ? Hypertension Mother   ? Diabetes Father   ? Heart disease Father   ? Hypertension Father   ? Breast cancer Maternal Grandmother   ? Leukemia Maternal Grandfather   ? Cancer Maternal Grandfather   ? Breast cancer Paternal Grandmother   ?  Ovarian cancer Paternal Grandmother   ? Cervical cancer Paternal Grandmother   ? Lung cancer Paternal Grandfather   ? Cancer Paternal Aunt   ? Colon cancer Neg Hx   ? ? ?Social History  ? ?Socioeconomic History  ? Marital status: Married  ?  Spouse name: Not on file  ? Number of children: 2  ? Years of education: Not on file  ? Highest education level: Not on file  ?Occupational History  ?  Comment: Secondary school teacher - [employer]  ?Tobacco Use  ? Smoking status: Former  ?  Packs/day: 0.50  ?  Years: 3.00  ?  Pack years: 1.50  ?  Types: Cigarettes  ?  Quit date: 03/18/2015  ?  Years since quitting: 6.8  ? Smokeless tobacco: Never  ?Vaping Use  ? Vaping Use: Former  ? Substances: THC  ? Devices: THC Oil  ?Substance and Sexual Activity  ? Alcohol use: Not Currently  ? Drug use: Yes  ?  Types: Marijuana  ?  Comment: oil  ? Sexual activity: Yes  ?  Birth control/protection: I.U.D.  ?Other Topics Concern  ? Not on file  ?Social History Narrative  ? Lives in [place] with husband and dtr.  Does not routinely exercise.  ? ?Social Determinants of Health  ? ?Emergency planning/management officer  Strain: Not on file  ?Food Insecurity: Not on file  ?Transportation Needs: Not on file  ?Physical Activity: Not on file  ?Stress: Not on file  ?Social Connections: Not on file  ?Intimate Partner Violence: Not on file  ? ? ? ?Constitutional: Denies fever, malaise, fatigue, headache or abrupt weight changes.  ?HEENT: Patient reports left eye pain, drainage,  redness and right ear pain.  Denies ringing in the ears, wax buildup, runny nose, nasal congestion, bloody nose, or sore throat. ?Respiratory: Denies difficulty breathing, shortness of breath, cough or sputum production.   ?Cardiovascular: Denies chest pain, chest tightness, palpitations or swelling in the hands or feet.  ? ?No other specific complaints in a complete review of systems (except as listed in HPI above). ? ?Objective:  ? Physical Exam ? ?BP 116/78 (BP Location: Right Arm, Patient Position:  Sitting, Cuff Size: Large)   Pulse 91   Temp 97.8 ?F (36.6 ?C) (Temporal)   Wt 140 lb (63.5 kg)   SpO2 98%   BMI 23.30 kg/m?  ? ?Wt Readings from Last 3 Encounters:  ?11/23/21 145 lb (65.8 kg)  ?11/20/21 145 lb 3.2 oz (65.9 kg)  ?10/06/21 145 lb (65.8 kg)  ? ? ?General: Appears her stated age, well developed, well nourished in NAD. ?HEENT: Head: normal shape and size; Left Eyes: sclera white, no icterus, conjunctiva pink, and EOMs intact; Right Ear: Tm's gray and intact, normal light reflex;  Throat/Mouth: Teeth present, mucosa pink and moist, no exudate, lesions or ulcerations noted.  ?Cardiovascular: Normal rate and rhythm. S1,S2 noted.  No murmur, rubs or gallops noted.  ?Pulmonary/Chest: Normal effort and positive vesicular breath sounds. No respiratory distress. No wheezes, rales or ronchi noted.  ? ? ?BMET ?   ?Component Value Date/Time  ? NA 140 06/27/2020 1556  ? NA 138 09/18/2014 1609  ? K 3.6 06/27/2020 1556  ? K 3.5 09/18/2014 1609  ? CL 107 06/27/2020 1556  ? CL 102 09/18/2014 1609  ? CO2 23 06/27/2020 1556  ? CO2 26 09/18/2014 1609  ? GLUCOSE 104 (H) 06/27/2020 1556  ? GLUCOSE 96 09/18/2014 1609  ? BUN 10 06/27/2020 1556  ? BUN 5 (L) 09/18/2014 1609  ? CREATININE 0.76 06/27/2020 1556  ? CREATININE 0.80 09/18/2014 1609  ? CALCIUM 9.0 06/27/2020 1556  ? CALCIUM 8.7 09/18/2014 1609  ? GFRNONAA >60 06/27/2020 1556  ? GFRNONAA >60 09/18/2014 1609  ? GFRNONAA >60 01/19/2012 1125  ? GFRAA >60 08/21/2018 1317  ? GFRAA >60 09/18/2014 1609  ? GFRAA >60 01/19/2012 1125  ? ? ?Lipid Panel  ?   ?Component Value Date/Time  ? CHOL 143 09/01/2015 1202  ? TRIG 42 09/01/2015 1202  ? HDL 65 09/01/2015 1202  ? CHOLHDL 2.2 09/01/2015 1202  ? [place] 70 09/01/2015 1202  ? ? ?CBC ?   ?Component Value Date/Time  ? WBC 6.4 11/20/2021 1007  ? RBC 4.30 11/20/2021 1007  ? HGB 12.8 11/20/2021 1007  ? HGB 8.8 (L) 03/05/2017 0949  ? HCT 39.1 11/20/2021 1007  ? HCT 29.3 (L) 03/05/2017 0949  ? PLT 227 11/20/2021 1007  ? PLT 234  08/31/2016 0926  ? MCV 90.9 11/20/2021 1007  ? MCV 78 (L) 08/31/2016 0926  ? MCV 79 (L) 09/18/2014 1609  ? MCH 29.8 11/20/2021 1007  ? MCHC 32.7 11/20/2021 1007  ? RDW 11.9 11/20/2021 1007  ? RDW 15.6 (H) 08/31/2016 0926  ? RDW 14.5 09/18/2014 1609  ? LYMPHSABS 1.9 11/20/2021 1007  ? LYMPHSABS  1.2 08/31/2016 0926  ? LYMPHSABS 1.4 09/18/2014 1609  ? MONOABS 0.3 11/20/2021 1007  ? MONOABS 0.4 09/18/2014 1609  ? EOSABS 0.1 11/20/2021 1007  ? EOSABS 0.0 08/31/2016 0926  ? EOSABS 0.0 09/18/2014 1609  ? BASOSABS 0.0 11/20/2021 1007  ? BASOSABS 0.0 08/31/2016 0926  ? BASOSABS 0.0 09/18/2014 1609  ? ? ?Hgb A1C ?Lab Results  ?Component Value Date  ? HGBA1C 5.6 09/01/2015  ? ? ? ? ? ? ?   ?Assessment & Plan:  ? ?Left Eye Redness, Drainage, Pain: ? ?She has been using her daughter's eyedrops soexam today is not really consistent with conjunctivitis ?Encourage warm compresses ? ?Otalgia, Right: ? ?Exam benign ?No indication of foreign body or infection ?Continue to monitor ? ?Update me for new or worsening symptoms, RTC in 4 months for your annual exam ? ?Webb Silversmith, NP ? ?

## 2022-01-19 NOTE — Patient Instructions (Signed)
Earache, Adult ?An earache, or ear pain, can be caused by many things, including: ?An infection. ?Ear wax buildup. ?Ear pressure. ?Something in the ear that should not be there (foreign body). ?A sore throat. ?Tooth problems. ?Jaw problems. ?Treatment of the earache will depend on the cause. If the cause is not clear or cannot be determined, you may need to watch your symptoms until your earache goes away or until a cause is found. ?Follow these instructions at home: ?Medicines ?Take or apply over-the-counter and prescription medicines only as told by your health care provider. ?If you were prescribed an antibiotic medicine, use it as told by your health care provider. Do not stop using the antibiotic even if you start to feel better. ?Do not put anything in your ear other than medicine that is prescribed by your health care provider. ?Managing pain ?If directed, apply heat to the affected area as often as told by your health care provider. Use the heat source that your health care provider recommends, such as a moist heat pack or a heating pad. ?Place a towel between your skin and the heat source. ?Leave the heat on for 20-30 minutes. ?Remove the heat if your skin turns bright red. This is especially important if you are unable to feel pain, heat, or cold. You may have a greater risk of getting burned. ?If directed, put ice on the affected area as often as told by your health care provider. To do this: ? ?  ? ?Put ice in a plastic bag. ?Place a towel between your skin and the bag. ?Leave the ice on for 20 minutes, 2-3 times a day. ?General instructions ?Pay attention to any changes in your symptoms. ?Try resting in an upright position instead of lying down. This may help to reduce pressure in your ear and relieve pain. ?Chew gum if it helps to relieve your ear pain. ?Treat any allergies as told by your health care provider. ?Drink enough fluid to keep your urine pale yellow. ?It is up to you to get the results of  any tests that were done. Ask your health care provider, or the department that is doing the tests, when your results will be ready. ?Keep all follow-up visits as told by your health care provider. This is important. ?Contact a health care provider if: ?Your pain does not improve within 2 days. ?Your earache gets worse. ?You have new symptoms. ?You have a fever. ?Get help right away if you: ?Have a severe headache. ?Have a stiff neck. ?Have trouble swallowing. ?Have redness or swelling behind your ear. ?Have fluid or blood coming from your ear. ?Have hearing loss. ?Feel dizzy. ?Summary ?An earache, or ear pain, can be caused by many things. ?Treatment of the earache will depend on the cause. Follow recommendations from your health care provider to treat your ear pain. ?If the cause is not clear or cannot be determined, you may need to watch your symptoms until your earache goes away or until a cause is found. ?Keep all follow-up visits as told by your health care provider. This is important. ?This information is not intended to replace advice given to you by your health care provider. Make sure you discuss any questions you have with your health care provider. ?Document Revised: 04/10/2019 Document Reviewed: 04/11/2019 ?Elsevier Patient Education ? [place]. ? ?

## 2022-02-14 ENCOUNTER — Ambulatory Visit: Payer: Self-pay | Admitting: *Deleted

## 2022-02-14 NOTE — Telephone Encounter (Signed)
  Chief Complaint: Abdominal pain Symptoms: Upper abdominal pain "Right under ribs" Nausea and vomiting Saturday, none further, diarrhea, none today. Unintentional weight loss of 7 lbs since Friday. Rates pain at 4/10, constant. States "Can't eat, gags me." Trying to stay hydrated.Initially called to request GI referral. Frequency: months, "Flare up " with nausea Friday Pertinent Negatives: Patient denies  Disposition: [] ED /[] Urgent Care (no appt availability in office) / [x] Appointment(In office/virtual)/ []  [place]/ [] Home Care/ [] Refused Recommended Disposition /[] [facility]/ []  Follow-up with PCP Additional Notes: Secured first available appt. After hours call. Assured pt NT would route to practice for PCPs review and final disposition. Placed on wait list.  Care advise provided, stay hydrated, ED for worsening symptoms. Verbalizes understanding Reason for Disposition  Abdominal pain is a chronic symptom (recurrent or ongoing AND present > 4 weeks)  Answer Assessment - Initial Assessment Questions 1. LOCATION: "Where does it hurt?"      Upper abdomen below ribcage 2. RADIATION: "Does the pain shoot anywhere else?" (e.g., chest, back)     Middle of shoulders 3. ONSET: "When did the pain begin?" (e.g., minutes, hours or days ago)      "Ongoing for a long time, like a flare up." 4. SUDDEN: "Gradual or sudden onset?"     Gradual 5. PATTERN "Does the pain come and go, or is it constant?"    - If constant: "Is it getting better, staying the same, or worsening?"      (Note: Constant means the pain never goes away completely; most serious pain is constant and it progresses)     - If intermittent: "How long does it last?" "Do you have pain now?"     (Note: Intermittent means the pain goes away completely between bouts)     Varies 6. SEVERITY: "How bad is the pain?"  (e.g., Scale 1-10; mild, moderate, or severe)   - MILD (1-3): doesn't interfere with normal  activities, abdomen soft and not tender to touch    - MODERATE (4-7): interferes with normal activities or awakens from sleep, abdomen tender to touch    - SEVERE (8-10): excruciating pain, doubled over, unable to do any normal activities      4/10 7. RECURRENT SYMPTOM: "Have you ever had this type of stomach pain before?" If Yes, ask: "When was the last time?" and "What happened that time?"      Yes"They don't know what's wrong with me." 8. CAUSE: "What do you think is causing the stomach pain?"     Unsure 9. RELIEVING/AGGRAVATING FACTORS: "What makes it better or worse?" (e.g., movement, antacids, bowel movement)     Worse after eating 10. OTHER SYMPTOMS: "Do you have any other symptoms?" (e.g., back pain, diarrhea, fever, urination pain, vomiting)       Nausea Friday, vomiting Saturday, poor appetite x 6 days, LGT 99.8 today  Protocols used: Abdominal Pain - Female-A-AH

## 2022-02-15 NOTE — Telephone Encounter (Signed)
That's fine

## 2022-02-16 ENCOUNTER — Encounter: Payer: Self-pay | Admitting: Internal Medicine

## 2022-02-16 ENCOUNTER — Ambulatory Visit: Payer: Managed Care, Other (non HMO) | Admitting: Internal Medicine

## 2022-02-16 VITALS — BP 132/82 | HR 93 | Temp 97.7°F | Wt 138.0 lb

## 2022-02-16 DIAGNOSIS — R1011 Right upper quadrant pain: Secondary | ICD-10-CM

## 2022-02-16 DIAGNOSIS — K591 Functional diarrhea: Secondary | ICD-10-CM

## 2022-02-16 DIAGNOSIS — R1013 Epigastric pain: Secondary | ICD-10-CM

## 2022-02-16 DIAGNOSIS — F439 Reaction to severe stress, unspecified: Secondary | ICD-10-CM

## 2022-02-16 DIAGNOSIS — F411 Generalized anxiety disorder: Secondary | ICD-10-CM

## 2022-02-16 DIAGNOSIS — R112 Nausea with vomiting, unspecified: Secondary | ICD-10-CM

## 2022-02-16 MED ORDER — OMEPRAZOLE 20 MG PO CPDR
20.0000 mg | DELAYED_RELEASE_CAPSULE | Freq: Every day | ORAL | 0 refills | Status: DC
Start: 2022-02-16 — End: 2024-01-02

## 2022-02-16 MED ORDER — BUPROPION HCL ER (XL) 150 MG PO TB24: 150.0000 mg | ORAL_TABLET | Freq: Every day | ORAL | 0 refills | Status: DC

## 2022-02-16 NOTE — Progress Notes (Signed)
Subjective:    Patient ID: Sarah Edwards, female    DOB: 1991-07-10, 31 y.o.   MRN: [id number]  HPI  Pt presents to the clinic today with c/o stress and anxiety. This started about 8 months ago. She reports this is being triggered by stress at work and at home. She is unable to focus, she is not motivated to clean her home. She has been having crying spells. She does not feel depressed. She reports the increase is stress is causing intermittent abdominal pain. The pain starts in the epigastric region and radiates into the RUQ. She describes the pain as soreness. She reports associated burning sensation of the skin. She reports associated nausea, vomiting and diarrhea. She denies blood in her stools. She has noticed these symptoms seem worse after she eats. She has been having reflux. She has a history of GERD and ulcerative colitis, but manages this with diet. She does not follow with GI. Colonoscopy from 2014 reviewed.  Review of Systems     Past Medical History:  Diagnosis Date   Abnormal uterine bleeding (AUB)    Anemia    Anemia during pregnancy in third trimester 03/21/2017   Anxiety    Bright red rectal bleeding 08/19/2018   Chronic UTI    Crohn disease ([facility])    Endometriosis    IBS (irritable bowel syndrome)    Painful menstrual periods    SAB (spontaneous abortion) 01/06/2014    Current Outpatient Medications  Medication Sig Dispense Refill   doxycycline (VIBRA-TABS) 100 MG tablet Take 1 tablet (100 mg total) by mouth 2 (two) times daily. 20 tablet 0   levonorgestrel (MIRENA) 20 MCG/24HR IUD 1 each by Intrauterine route once.     No current facility-administered medications for this visit.    Allergies  Allergen Reactions   Azithromycin Swelling   Amoxicillin Rash   Penicillins Rash    Childhood reaction   Sulfa Antibiotics Nausea And Vomiting    Constant vomiting    Family History  Problem Relation Age of Onset   Diabetes Mother    Hypertension Mother     Diabetes Father    Heart disease Father    Hypertension Father    Breast cancer Maternal Grandmother    Leukemia Maternal Grandfather    Cancer Maternal Grandfather    Breast cancer Paternal Grandmother    Ovarian cancer Paternal Grandmother    Cervical cancer Paternal Grandmother    Lung cancer Paternal Grandfather    Cancer Paternal Aunt    Colon cancer Neg Hx     Social History   Socioeconomic History   Marital status: Married    Spouse name: Not on file   Number of children: 2   Years of education: Not on file   Highest education level: Not on file  Occupational History    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  Other Topics Concern  Not on file  Social History Narrative   Lives in [place] with husband and dtr.  Does not routinely exercise.   Social Determinants of Health   Financial Resource Strain: Not on file  Food Insecurity: Not on file  Transportation Needs: Not on file  Physical Activity: Not on file  Stress: Not on file  Social Connections: Not on file  Intimate Partner Violence: Not on file     Constitutional: Denies fever, malaise, fatigue, headache or abrupt weight changes.  Respiratory: Denies difficulty breathing, shortness of breath, cough or sputum production.   Cardiovascular: Denies chest pain, chest tightness, palpitations or swelling in the hands or feet.  Gastrointestinal: Pt reports abdominal pain, nausea, vomiting, and diarrhea. Denies bloating, constipation, or blood in the stool.  GU: Denies  urgency, frequency, pain with urination, burning sensation, blood in urine, odor or discharge. Neurological: Pt reports difficulty focusing, lack of motivation. Denies dizziness, difficulty with memory, difficulty with speech or problems with balance and coordination.  Psych: Pt reports stress, anxiety. Denies depression, SI/HI.  No other specific complaints in a complete review of systems (except as listed in HPI above).  Objective:   Physical Exam  BP 132/82 (BP Location: Left Arm, Patient Position: Sitting, Cuff Size: Normal)   Pulse 93   Temp 97.7 F (36.5 C) (Temporal)   Wt 138 lb (62.6 kg)   SpO2 100%   BMI 22.96 kg/m   Wt Readings from Last 3 Encounters:  01/19/22 140 lb (63.5 kg)  11/23/21 145 lb (65.8 kg)  11/20/21 145 lb 3.2 oz (65.9 kg)    General: Appears her stated age, well developed, well nourished in NAD. Skin: Warm, dry and intact.  Cardiovascular: Normal rate and rhythm. S1,S2 noted.  No murmur, rubs or gallops noted.  Pulmonary/Chest: Normal effort and positive vesicular breath sounds. No respiratory distress. No wheezes, rales or ronchi noted.  Abdomen: Soft and tender in the epigastric region. Hyperactive bowel sounds. No distention or masses noted.  Musculoskeletal:  No difficulty with gait.  Neurological: Alert and oriented.  Psychiatric: Anxious appearing, tearful. Judgment and thought content normal.    BMET    Component Value Date/Time   NA 140 06/27/2020 1556   NA 138 09/18/2014 1609   K 3.6 06/27/2020 1556   K 3.5 09/18/2014 1609   CL 107 06/27/2020 1556   CL 102 09/18/2014 1609   CO2 23 06/27/2020 1556   CO2 26 09/18/2014 1609   GLUCOSE 104 (H) 06/27/2020 1556   GLUCOSE 96 09/18/2014 1609   BUN 10 06/27/2020 1556   BUN 5 (L) 09/18/2014 1609   CREATININE 0.76 06/27/2020 1556   CREATININE 0.80 09/18/2014 1609   CALCIUM 9.0 06/27/2020 1556   CALCIUM 8.7 09/18/2014 1609   GFRNONAA >60 06/27/2020 1556   GFRNONAA >60 09/18/2014 1609    GFRNONAA >60 01/19/2012 1125   GFRAA >60 08/21/2018 1317   GFRAA >60 09/18/2014 1609   GFRAA >60 01/19/2012 1125    Lipid Panel     Component Value Date/Time   CHOL 143 09/01/2015 1202   TRIG 42 09/01/2015 1202   HDL 65 09/01/2015 1202   CHOLHDL 2.2 09/01/2015 1202   LDLCALC 70 09/01/2015 1202    CBC    Component Value Date/Time   WBC 6.4 11/20/2021 1007   RBC 4.30 11/20/2021 1007   HGB 12.8 11/20/2021 1007   HGB 8.8 (L) 03/05/2017 0949   HCT 39.1 11/20/2021 1007   HCT 29.3 (L) 03/05/2017 0949   PLT  227 11/20/2021 1007   PLT 234 08/31/2016 0926   MCV 90.9 11/20/2021 1007   MCV 78 (L) 08/31/2016 0926   MCV 79 (L) 09/18/2014 1609   MCH 29.8 11/20/2021 1007   MCHC 32.7 11/20/2021 1007   RDW 11.9 11/20/2021 1007   RDW 15.6 (H) 08/31/2016 0926   RDW 14.5 09/18/2014 1609   LYMPHSABS 1.9 11/20/2021 1007   LYMPHSABS 1.2 08/31/2016 0926   LYMPHSABS 1.4 09/18/2014 1609   MONOABS 0.3 11/20/2021 1007   MONOABS 0.4 09/18/2014 1609   EOSABS 0.1 11/20/2021 1007   EOSABS 0.0 08/31/2016 0926   EOSABS 0.0 09/18/2014 1609   BASOSABS 0.0 11/20/2021 1007   BASOSABS 0.0 08/31/2016 0926   BASOSABS 0.0 09/18/2014 1609    Hgb A1C Lab Results  Component Value Date   HGBA1C 5.6 09/01/2015           Assessment & Plan:   Epigastric Pain, RUQ Pain, Nausea, Vomiting, Diarrhea, Stress, and Anxiety:  GI symptoms likely being caused by stress and anxiety We will check c-Met, amylase and lipase today Rx for Omeprazole 20 mg daily at bedtime She declines medication for nausea at this time Rx for Wellbutrin 150 mg XL daily at bedtime Support offered  Encouraged her to update me in 1 month and let me know how her symptoms are, sooner if needed.  We will follow-up after labs are back with further recommendation and treatment plan Webb Silversmith, NP

## 2022-02-16 NOTE — Patient Instructions (Signed)

## 2022-02-17 LAB — COMPLETE METABOLIC PANEL WITH GFR
AG Ratio: 1.7 (calc) (ref 1.0–2.5)
ALT: 16 U/L (ref 6–29)
AST: 17 U/L (ref 10–30)
Albumin: 4.8 g/dL (ref 3.6–5.1)
Alkaline phosphatase (APISO): 58 U/L (ref 31–125)
BUN: 8 mg/dL (ref 7–25)
CO2: 26 mmol/L (ref 20–32)
Calcium: 9.8 mg/dL (ref 8.6–10.2)
Chloride: 104 mmol/L (ref 98–110)
Creat: 0.71 mg/dL (ref 0.50–0.97)
Globulin: 2.9 g/dL (calc) (ref 1.9–3.7)
Glucose, Bld: 91 mg/dL (ref 65–139)
Potassium: 4 mmol/L (ref 3.5–5.3)
Sodium: 140 mmol/L (ref 135–146)
Total Bilirubin: 0.4 mg/dL (ref 0.2–1.2)
Total Protein: 7.7 g/dL (ref 6.1–8.1)
eGFR: 117 mL/min/{1.73_m2} (ref >=60)

## 2022-02-17 LAB — AMYLASE: Amylase: 58 U/L (ref 21–101)

## 2022-02-17 LAB — LIPASE: Lipase: 51 U/L (ref 7–60)

## 2022-02-20 ENCOUNTER — Ambulatory Visit: Payer: Managed Care, Other (non HMO) | Admitting: Internal Medicine

## 2022-03-29 ENCOUNTER — Telehealth: Payer: Self-pay

## 2022-03-29 NOTE — Telephone Encounter (Signed)
Copied from [place] [id number]. Topic: General - Other >> Mar 29, 2022  3:11 PM [place] B wrote: Reason for OJZ:[id number] called in wanting to let nurse know that the, buPROPion (WELLBUTRIN XL) 150 MG 24 hr tablet is working for her. She hasn't taken it in 2 days because she forgot but she says it does work when she takes it.

## 2022-03-30 NOTE — Telephone Encounter (Signed)
Thanks for the update

## 2022-05-22 ENCOUNTER — Encounter: Payer: Self-pay | Admitting: Internal Medicine

## 2022-05-23 ENCOUNTER — Ambulatory Visit: Payer: Self-pay
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
## 2022-05-25 ENCOUNTER — Ambulatory Visit: Payer: Managed Care, Other (non HMO) | Admitting: Physician Assistant

## 2022-05-25 VITALS — BP 104/68 | HR 85 | Resp 15 | Ht 65.0 in | Wt 138.4 lb

## 2022-05-25 DIAGNOSIS — S060X0A Concussion without loss of consciousness, initial encounter: Secondary | ICD-10-CM

## 2022-05-25 DIAGNOSIS — M542 Cervicalgia: Secondary | ICD-10-CM

## 2022-05-25 MED ORDER — MELOXICAM 15 MG PO TABS: 15.0000 mg | ORAL_TABLET | Freq: Every day | ORAL | 0 refills | Status: DC
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
## 2022-05-25 NOTE — Patient Instructions (Signed)
  I recommend the following at this time to help relieve that discomfort:  Rest Warm compresses to the area (20 minutes on, minimum of 30 minutes off) You can alternate Tylenol and Ibuprofen for pain management but Ibuprofen is typically preferred to reduce inflammation.  Tylenol is preferred due to your kidney function Gentle stretches and exercises that I have included in your paperwork Try to reduce excess strain to the area and rest as much as possible  Wear supportive shoes and, if you must lift anything, use proper lifting techniques that spare your back.   If these measures do not lead to improvement in your symptoms over the next 2-4 weeks please let us know

## 2022-05-29 ENCOUNTER — Other Ambulatory Visit: Payer: Self-pay | Admitting: Internal Medicine
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
## 2022-06-15 ENCOUNTER — Ambulatory Visit: Payer: Self-pay
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
## 2022-06-15 NOTE — Telephone Encounter (Signed)
noted 

## 2022-06-19 ENCOUNTER — Ambulatory Visit
Admission: RE | Admit: 2022-06-19 | Discharge: 2022-06-19 | Disposition: A | Discharge status: Home / Self Care | Payer: Managed Care, Other (non HMO) | Source: Ambulatory Visit | Attending: Internal Medicine | Admitting: Internal Medicine

## 2022-06-19 ENCOUNTER — Encounter: Payer: Self-pay | Admitting: Internal Medicine

## 2022-06-19 ENCOUNTER — Ambulatory Visit: Payer: Managed Care, Other (non HMO) | Admitting: Internal Medicine

## 2022-06-19 ENCOUNTER — Ambulatory Visit
Admission: RE | Admit: 2022-06-19 | Discharge: 2022-06-19 | Disposition: A | Discharge status: Home / Self Care | Payer: Managed Care, Other (non HMO) | Attending: Internal Medicine | Admitting: Internal Medicine

## 2022-06-19 VITALS — BP 128/82 | HR 115 | Temp 97.3°F | Wt 142.0 lb

## 2022-06-19 DIAGNOSIS — M25512 Pain in left shoulder: Secondary | ICD-10-CM | POA: Insufficient documentation

## 2022-06-19 DIAGNOSIS — G44329 Chronic post-traumatic headache, not intractable: Secondary | ICD-10-CM

## 2022-06-19 DIAGNOSIS — M542 Cervicalgia: Secondary | ICD-10-CM

## 2022-06-19 MED ORDER — METHOCARBAMOL 500 MG PO TABS: 500.0000 mg | ORAL_TABLET | Freq: Three times a day (TID) | ORAL | 0 refills | Status: DC | PRN

## 2022-06-19 NOTE — Patient Instructions (Signed)
Neck Exercises Ask your health care provider which exercises are safe for you. Do exercises exactly as told by your health care provider and adjust them as directed. It is normal to feel mild stretching, pulling, tightness, or discomfort as you do these exercises. Stop right away if you feel sudden pain or your pain gets worse. Do not begin these exercises until told by your health care provider. Neck exercises can be important for many reasons. They can improve strength and maintain flexibility in your neck, which will help your upper back and prevent neck pain. Stretching exercises Rotation neck stretching  Sit in a chair or stand up. Place your feet flat on the floor, shoulder-width apart. Slowly turn your head (rotate) to the right until a slight stretch is felt. Turn it all the way to the right so you can look over your right shoulder. Do not tilt or tip your head. Hold this position for 10-30 seconds. Slowly turn your head (rotate) to the left until a slight stretch is felt. Turn it all the way to the left so you can look over your left shoulder. Do not tilt or tip your head. Hold this position for 10-30 seconds. Repeat __________ times. Complete this exercise __________ times a day. Neck retraction  Sit in a sturdy chair or stand up. Look straight ahead. Do not bend your neck. Use your fingers to push your chin backward (retraction). Do not bend your neck for this movement. Continue to face straight ahead. If you are doing the exercise properly, you will feel a slight sensation in your throat and a stretch at the back of your neck. Hold the stretch for 1-2 seconds. Repeat __________ times. Complete this exercise __________ times a day. Strengthening exercises Neck press  Lie on your back on a firm bed or on the floor with a pillow under your head. Use your neck muscles to push your head down on the pillow and straighten your spine. Hold the position as well as you can. Keep your head  facing up (in a neutral position) and your chin tucked. Slowly count to 5 while holding this position. Repeat __________ times. Complete this exercise __________ times a day. Isometrics These are exercises in which you strengthen the muscles in your neck while keeping your neck still (isometrics). Sit in a supportive chair and place your hand on your forehead. Keep your head and face facing straight ahead. Do not flex or extend your neck while doing isometrics. Push forward with your head and neck while pushing back with your hand. Hold for 10 seconds. Do the sequence again, this time putting your hand against the back of your head. Use your head and neck to push backward against the hand pressure. Finally, do the same exercise on either side of your head, pushing sideways against the pressure of your hand. Repeat __________ times. Complete this exercise __________ times a day. Prone head lifts  Lie face-down (prone position), resting on your elbows so that your chest and upper back are raised. Start with your head facing downward, near your chest. Position your chin either on or near your chest. Slowly lift your head upward. Lift until you are looking straight ahead. Then continue lifting your head as far back as you can comfortably stretch. Hold your head up for 5 seconds. Then slowly lower it to your starting position. Repeat __________ times. Complete this exercise __________ times a day. Supine head lifts  Lie on your back (supine position), bending your knees  to point to the ceiling and keeping your feet flat on the floor. Lift your head slowly off the floor, raising your chin toward your chest. Hold for 5 seconds. Repeat __________ times. Complete this exercise __________ times a day. Scapular retraction  Stand with your arms at your sides. Look straight ahead. Slowly pull both shoulders (scapulae) backward and downward (retraction) until you feel a stretch between your shoulder  blades in your upper back. Hold for 10-30 seconds. Relax and repeat. Repeat __________ times. Complete this exercise __________ times a day. Contact a health care provider if: Your neck pain or discomfort gets worse when you do an exercise. Your neck pain or discomfort does not improve within 2 hours after you exercise. If you have any of these problems, stop exercising right away. Do not do the exercises again unless your health care provider says that you can. Get help right away if: You develop sudden, severe neck pain. If this happens, stop exercising right away. Do not do the exercises again unless your health care provider says that you can. This information is not intended to replace advice given to you by your health care provider. Make sure you discuss any questions you have with your health care provider. Document Revised: 02/28/2021 Document Reviewed: 02/28/2021 Elsevier Patient Education  [place].
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
## 2022-06-22 ENCOUNTER — Ambulatory Visit: Payer: Self-pay | Admitting: *Deleted

## 2022-06-22 MED ORDER — PREDNISONE 10 MG PO TABS: ORAL_TABLET | ORAL | 0 refills | Status: DC

## 2022-06-22 NOTE — Telephone Encounter (Signed)
I do not want to take meloxicam, discontinue this off her list.  I have sent in prednisone to try in conjunction with the muscle relaxers.  If symptoms worsen she may want to consider going to [facility] urgent care over the weekend.

## 2022-06-22 NOTE — Telephone Encounter (Signed)
Pt advised.   Thanks,   -Lekha Dancer  
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
## 2022-06-25 ENCOUNTER — Encounter: Payer: Self-pay | Admitting: Certified Nurse Midwife

## 2022-07-18 ENCOUNTER — Telehealth: Payer: Self-pay
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
## 2022-07-19 NOTE — Telephone Encounter (Signed)
Ok for note stating she was seen on those dates

## 2022-10-04 ENCOUNTER — Ambulatory Visit: Payer: Managed Care, Other (non HMO) | Admitting: Internal Medicine

## 2022-10-04 ENCOUNTER — Encounter: Payer: Self-pay | Admitting: Oncology

## 2022-10-04 ENCOUNTER — Encounter: Payer: Self-pay | Admitting: Internal Medicine

## 2022-10-04 VITALS — BP 116/80 | HR 94 | Temp 96.9°F | Ht 65.0 in | Wt 143.0 lb

## 2022-10-04 DIAGNOSIS — Z1159 Encounter for screening for other viral diseases: Secondary | ICD-10-CM

## 2022-10-04 DIAGNOSIS — K5090 Crohn's disease, unspecified, without complications: Secondary | ICD-10-CM | POA: Diagnosis not present

## 2022-10-04 DIAGNOSIS — Z0001 Encounter for general adult medical examination with abnormal findings: Secondary | ICD-10-CM | POA: Diagnosis not present

## 2022-10-04 DIAGNOSIS — R7309 Other abnormal glucose: Secondary | ICD-10-CM

## 2022-10-04 DIAGNOSIS — J3089 Other allergic rhinitis: Secondary | ICD-10-CM | POA: Diagnosis not present

## 2022-10-04 MED ORDER — MESALAMINE 1.2 G PO TBEC: 1.2000 g | DELAYED_RELEASE_TABLET | Freq: Two times a day (BID) | ORAL | 0 refills | Status: DC

## 2022-10-04 NOTE — Progress Notes (Signed)
Subjective:    Patient ID: Sarah Edwards, female    DOB: [birth year], 32 y.o.   MRN: [id number]  HPI  Patient presents to clinic today for her annual exam.  She reports she has not been feeling well this week.  She reports headache, runny nose, left ear pain, abdominal pain and diarrhea.  She denies nasal congestion, sore throat, cough, shortness of breath.  She denies fever body aches but has had chills.  She has not noticed any blood in her stool, nausea or vomiting.  She does have a history of Crohn's but is not currently taking any medications for this.  Flu: 06/2022 Tetanus: 01/2017 COVID: Pfizer x 2 Pap smear: 08/2021 Dentist: biannually  Diet: She does eat meat. She consumes fruits and veggies. She does not eat fried foods. She drinks mostly water and coffee. Exercise: None  Review of Systems  Past Medical History:  Diagnosis Date   Abnormal uterine bleeding (AUB)    Anemia    Anemia during pregnancy in third trimester 03/21/2017   Anxiety    Bright red rectal bleeding 08/19/2018   Chronic UTI    Crohn disease (HCC)    Endometriosis    IBS (irritable bowel syndrome)    Painful menstrual periods    SAB (spontaneous abortion) 01/06/2014    Current Outpatient Medications  Medication Sig Dispense Refill   buPROPion (WELLBUTRIN XL) 150 MG 24 hr tablet TAKE 1 TABLET BY MOUTH ONCE DAILY 90 tablet 0   levonorgestrel (MIRENA) 20 MCG/24HR IUD 1 each by Intrauterine route once.     meloxicam (MOBIC) 15 MG tablet Take 1 tablet (15 mg total) by mouth daily. (Patient not taking: Reported on 06/19/2022) 30 tablet 0   methocarbamol (ROBAXIN) 500 MG tablet Take 1 tablet (500 mg total) by mouth every 8 (eight) hours as needed for muscle spasms. 30 tablet 0   omeprazole (PRILOSEC) 20 MG capsule Take 1 capsule (20 mg total) by mouth daily. 30 capsule 0   predniSONE (DELTASONE) 10 MG tablet Take 6 tabs on day 1, 5 tabs on day 2, 4 tabs on day 3, 3 tabs on day 4, 2 tabs on day 5, 1 tab on day  6 21 tablet 0   No current facility-administered medications for this visit.    Allergies  Allergen Reactions   Azithromycin Swelling   Amoxicillin Rash   Penicillins Rash    Childhood reaction   Sulfa Antibiotics Nausea And Vomiting    Constant vomiting    Family History  Problem Relation Age of Onset   Diabetes Mother    Hypertension Mother    Diabetes Father    Heart disease Father    Hypertension Father    Breast cancer Maternal Grandmother    Leukemia Maternal Grandfather    Cancer Maternal Grandfather    Breast cancer Paternal Grandmother    Ovarian cancer Paternal Grandmother    Cervical cancer Paternal Grandmother    Lung cancer Paternal Grandfather    Cancer Paternal Aunt    Colon cancer Neg Hx     Social History   Socioeconomic History   Marital status: Married    Spouse name: Not on file   Number of children: 2   Years of education: Not on file   Highest education level: Not on file  Occupational History    Comment: Investment banker, corporate - [employer]  Tobacco Use   Smoking status: Former    Packs/day: 0.50    Years: 3.00  Total pack years: 1.50    Types: Cigarettes    Quit date: 03/18/2015    Years since quitting: 7.5   Smokeless tobacco: Never  Vaping Use   Vaping Use: Former   Substances: THC   Devices: THC Oil  Substance and Sexual Activity   Alcohol use: Not Currently   Drug use: Yes    Types: Marijuana    Comment: oil   Sexual activity: Yes    Birth control/protection: I.U.D.  Other Topics Concern   Not on file  Social History Narrative   Lives in [place] with husband and dtr.  Does not routinely exercise.   Social Determinants of Health   Financial Resource Strain: Not on file  Food Insecurity: Not on file  Transportation Needs: Not on file  Physical Activity: Not on file  Stress: Not on file  Social Connections: Not on file  Intimate Partner Violence: Not on file     Constitutional: Pt reports headache, chills and  fatigue. Denies fever, malaise, or abrupt weight changes.  HEENT: Pt reports runny nose, watery eyes, left ear pain. Denies eye pain, eye redness, ringing in the ears, wax buildup, runny nose, nasal congestion, bloody nose, or sore throat. Respiratory: Denies difficulty breathing, shortness of breath, cough or sputum production.   Cardiovascular: Denies chest pain, chest tightness, palpitations or swelling in the hands or feet.  Gastrointestinal: Pt reports abdominal pain, diarrhea. Denies bloating, constipation, or blood in the stool.  GU: Denies urgency, frequency, pain with urination, burning sensation, blood in urine, odor or discharge. Musculoskeletal: Denies decrease in range of motion, difficulty with gait, muscle pain or joint pain and swelling.  Skin: Denies redness, rashes, lesions or ulcercations.  Neurological: Denies dizziness, difficulty with memory, difficulty with speech or problems with balance and coordination.  Psych: Patient has a history of anxiety.  Denies depression, SI/HI.  No other specific complaints in a complete review of systems (except as listed in HPI above).     Objective:   Physical Exam  BP 116/80 (BP Location: Left Arm, Patient Position: Sitting, Cuff Size: Normal)   Pulse 94   Temp (!) 96.9 F (36.1 C) (Temporal)   Ht 5\' 5"  (1.651 m)   Wt 143 lb (64.9 kg)   SpO2 100%   BMI 23.80 kg/m   Wt Readings from Last 3 Encounters:  06/19/22 142 lb (64.4 kg)  05/25/22 138 lb 6.4 oz (62.8 kg)  02/16/22 138 lb (62.6 kg)    General: Appears her stated age, well developed, well nourished in NAD. Skin: Warm, dry and intact. No rashes noted. HEENT: Head: normal shape and size; Eyes: sclera white, no icterus, conjunctiva pink, PERRLA and EOMs intact; Ears: Tm's gray and intact, distorted light reflex, scarring noted of bilateral TMs; Throat/Mouth: Teeth present, mucosa pink and moist, no exudate, lesions or ulcerations noted.  Neck:  Neck supple, trachea midline.  No masses, lumps or thyromegaly present.  Cardiovascular: Normal rate and rhythm. S1,S2 noted.  No murmur, rubs or gallops noted. No JVD or BLE edema.  Pulmonary/Chest: Normal effort and positive vesicular breath sounds. No respiratory distress. No wheezes, rales or ronchi noted.  Abdomen: Soft and tender in the epigastric region. Normal bowel sounds. No distention or masses noted. Liver, spleen and kidneys non palpable. Musculoskeletal: Strength 5/5 BUE/BLE.  No difficulty with gait.  Neurological: Alert and oriented. Cranial nerves II-XII grossly intact. Coordination normal.  Psychiatric: Mood and affect normal.  Mildly anxious appearing. Judgment and thought content normal.  BMET    Component Value Date/Time   NA 140 02/16/2022 1330   NA 138 09/18/2014 1609   K 4.0 02/16/2022 1330   K 3.5 09/18/2014 1609   CL 104 02/16/2022 1330   CL 102 09/18/2014 1609   CO2 26 02/16/2022 1330   CO2 26 09/18/2014 1609   GLUCOSE 91 02/16/2022 1330   GLUCOSE 96 09/18/2014 1609   BUN 8 02/16/2022 1330   BUN 5 (L) 09/18/2014 1609   CREATININE 0.71 02/16/2022 1330   CALCIUM 9.8 02/16/2022 1330   CALCIUM 8.7 09/18/2014 1609   GFRNONAA >60 06/27/2020 1556   GFRNONAA >60 09/18/2014 1609   GFRNONAA >60 01/19/2012 1125   GFRAA >60 08/21/2018 1317   GFRAA >60 09/18/2014 1609   GFRAA >60 01/19/2012 1125    Lipid Panel     Component Value Date/Time   CHOL 143 09/01/2015 1202   TRIG 42 09/01/2015 1202   HDL 65 09/01/2015 1202   CHOLHDL 2.2 09/01/2015 1202   LDLCALC 70 09/01/2015 1202    CBC    Component Value Date/Time   WBC 6.4 11/20/2021 1007   RBC 4.30 11/20/2021 1007   HGB 12.8 11/20/2021 1007   HGB 8.8 (L) 03/05/2017 0949   HCT 39.1 11/20/2021 1007   HCT 29.3 (L) 03/05/2017 0949   PLT 227 11/20/2021 1007   PLT 234 08/31/2016 0926   MCV 90.9 11/20/2021 1007   MCV 78 (L) 08/31/2016 0926   MCV 79 (L) 09/18/2014 1609   MCH 29.8 11/20/2021 1007   MCHC 32.7 11/20/2021 1007   RDW  11.9 11/20/2021 1007   RDW 15.6 (H) 08/31/2016 0926   RDW 14.5 09/18/2014 1609   LYMPHSABS 1.9 11/20/2021 1007   LYMPHSABS 1.2 08/31/2016 0926   LYMPHSABS 1.4 09/18/2014 1609   MONOABS 0.3 11/20/2021 1007   MONOABS 0.4 09/18/2014 1609   EOSABS 0.1 11/20/2021 1007   EOSABS 0.0 08/31/2016 0926   EOSABS 0.0 09/18/2014 1609   BASOSABS 0.0 11/20/2021 1007   BASOSABS 0.0 08/31/2016 0926   BASOSABS 0.0 09/18/2014 1609    Hgb A1C Lab Results  Component Value Date   HGBA1C 5.6 09/01/2015           Assessment & Plan:   Preventative Health Maintenance:  Flu shot UTD Tetanus UTD Encouraged her to get her COVID booster Pap smear UTD Encouraged her to consume a balanced diet and exercise regimen Advised her to see a dentist annually We will check CBC, c-Met, lipid, A1c and hep C today  Allergic R hinitis:  She already takes Zyrtec daily Advised her to add Flonase and daily as well RTC in 6 months, follow-up chronic conditions Webb Silversmith, NP

## 2022-10-04 NOTE — Patient Instructions (Signed)

## 2022-10-04 NOTE — Assessment & Plan Note (Signed)
Deteriorated We will start mesalamine 1.2 g twice daily Encouraged her to follow-up with GI

## 2022-10-05 LAB — COMPLETE METABOLIC PANEL WITH GFR
AG Ratio: 1.7 (calc) (ref 1.0–2.5)
ALT: 18 U/L (ref 6–29)
AST: 17 U/L (ref 10–30)
Albumin: 5 g/dL (ref 3.6–5.1)
Alkaline phosphatase (APISO): 60 U/L (ref 31–125)
BUN: 16 mg/dL (ref 7–25)
CO2: 26 mmol/L (ref 20–32)
Calcium: 9.9 mg/dL (ref 8.6–10.2)
Chloride: 103 mmol/L (ref 98–110)
Creat: 0.83 mg/dL (ref 0.50–0.97)
Globulin: 3 g/dL (calc) (ref 1.9–3.7)
Glucose, Bld: 87 mg/dL (ref 65–99)
Potassium: 3.7 mmol/L (ref 3.5–5.3)
Sodium: 140 mmol/L (ref 135–146)
Total Bilirubin: 0.6 mg/dL (ref 0.2–1.2)
Total Protein: 8 g/dL (ref 6.1–8.1)
eGFR: 97 mL/min/{1.73_m2} (ref >=60)

## 2022-10-05 LAB — CBC
HCT: 42.8 % (ref 35.0–45.0)
Hemoglobin: 14.7 g/dL (ref 11.7–15.5)
MCH: 30.9 pg (ref 27.0–33.0)
MCHC: 34.3 g/dL (ref 32.0–36.0)
MCV: 90.1 fL (ref 80.0–100.0)
MPV: 10.8 fL (ref 7.5–12.5)
Platelets: 202 10*3/uL (ref 140–400)
RBC: 4.75 10*6/uL (ref 3.80–5.10)
RDW: 11.4 % (ref 11.0–15.0)
WBC: 4.7 10*3/uL (ref 3.8–10.8)

## 2022-10-05 LAB — LIPID PANEL
Cholesterol: 143 mg/dL (ref <200)
HDL: 59 mg/dL (ref >=50)
LDL Cholesterol (Calc): 73 mg/dL (calc)
Non-HDL Cholesterol (Calc): 84 mg/dL (calc) (ref <130)
Total CHOL/HDL Ratio: 2.4 (calc) (ref <5.0)
Triglycerides: 37 mg/dL (ref <150)

## 2022-10-05 LAB — HEMOGLOBIN A1C
Hgb A1c MFr Bld: 5.4 % of total Hgb (ref <5.7)
Mean Plasma Glucose: 108 mg/dL
eAG (mmol/L): 6 mmol/L

## 2022-10-05 LAB — HEPATITIS C ANTIBODY: Hepatitis C Ab: NONREACTIVE

## 2022-10-23 ENCOUNTER — Encounter: Payer: Self-pay | Admitting: Internal Medicine

## 2022-10-23 ENCOUNTER — Ambulatory Visit: Payer: Managed Care, Other (non HMO) | Admitting: Internal Medicine

## 2022-10-23 VITALS — BP 126/72 | HR 93 | Temp 97.1°F | Wt 146.0 lb

## 2022-10-23 DIAGNOSIS — J029 Acute pharyngitis, unspecified: Secondary | ICD-10-CM | POA: Diagnosis not present

## 2022-10-23 DIAGNOSIS — Z20818 Contact with and (suspected) exposure to other bacterial communicable diseases: Secondary | ICD-10-CM

## 2022-10-23 LAB — POCT INFLUENZA A/B
Influenza A, POC: NEGATIVE
Influenza B, POC: NEGATIVE

## 2022-10-23 LAB — POC COVID19 BINAXNOW: SARS Coronavirus 2 Ag: NEGATIVE

## 2022-10-23 LAB — POCT RAPID STREP A (OFFICE): Rapid Strep A Screen: NEGATIVE

## 2022-10-23 MED ORDER — CEFDINIR 300 MG PO CAPS: 300.0000 mg | ORAL_CAPSULE | Freq: Two times a day (BID) | ORAL | 0 refills | Status: DC

## 2022-10-23 NOTE — Progress Notes (Signed)
Subjective:    Patient ID: Sarah Edwards, female    DOB: 08/19/1991, 32 y.o.   MRN: [id number]  HPI  Patient presents to clinic today with complaint of sore throat.  This started yesterday. She is having difficulty swallowing. She reports associated fatigue and cough. The cough is non productive.  She denies headache, runny nose, nasal congestion, ear pain, shortness of breath, chest pain, nausea, vomiting or diarrhea.  She reports she ran her low-grade fever yesterday but denies chills or bodyaches.  She has tried Dayquil and Ibuprofen OTC with minimal relief of symptoms.  She reports her kids have been diagnosed with strep throat.  Review of Systems     Past Medical History:  Diagnosis Date   Abnormal uterine bleeding (AUB)    Anemia    Anemia during pregnancy in third trimester 03/21/2017   Anxiety    Bright red rectal bleeding 08/19/2018   Chronic UTI    Crohn disease (HCC)    Endometriosis    IBS (irritable bowel syndrome)    Painful menstrual periods    SAB (spontaneous abortion) 01/06/2014    Current Outpatient Medications  Medication Sig Dispense Refill   levonorgestrel (MIRENA) 20 MCG/24HR IUD 1 each by Intrauterine route once.     mesalamine (LIALDA) 1.2 g EC tablet Take 1 tablet (1.2 g total) by mouth in the morning and at bedtime. 180 tablet 0   omeprazole (PRILOSEC) 20 MG capsule Take 1 capsule (20 mg total) by mouth daily. (Patient not taking: Reported on 10/04/2022) 30 capsule 0   No current facility-administered medications for this visit.    Allergies  Allergen Reactions   Azithromycin Swelling   Amoxicillin Rash   Penicillins Rash    Childhood reaction   Sulfa Antibiotics Nausea And Vomiting    Constant vomiting    Family History  Problem Relation Age of Onset   Diabetes Mother    Hypertension Mother    Diabetes Father    Heart disease Father    Hypertension Father    Heart attack Father    Coronary artery disease Father    Breast cancer Maternal  Grandmother    Leukemia Maternal Grandfather    Cancer Maternal Grandfather    Breast cancer Paternal Grandmother    Ovarian cancer Paternal Grandmother    Cervical cancer Paternal Grandmother    Lung cancer Paternal Grandfather    Cancer Paternal Aunt    Colon cancer Neg Hx     Social History   Socioeconomic History   Marital status: Married    Spouse name: Not on file   Number of children: 2   Years of education: Not on file   Highest education level: Not on file  Occupational History    Comment: Secondary school teacher - [employer]  Tobacco Use   Smoking status: Former    Packs/day: 0.50    Years: 3.00    Total pack years: 1.50    Types: Cigarettes    Quit date: 03/18/2015    Years since quitting: 7.6   Smokeless tobacco: Never  Vaping Use   Vaping Use: Former   Substances: THC   Devices: THC Oil  Substance and Sexual Activity   Alcohol use: Not Currently   Drug use: Yes    Types: Marijuana    Comment: oil   Sexual activity: Yes    Birth control/protection: I.U.D.  Other Topics Concern   Not on file  Social History Narrative   Lives in [place] with  husband and dtr.  Does not routinely exercise.   Social Determinants of Health   Financial Resource Strain: Not on file  Food Insecurity: Not on file  Transportation Needs: Not on file  Physical Activity: Not on file  Stress: Not on file  Social Connections: Not on file  Intimate Partner Violence: Not on file     Constitutional: Patient reports fatigue.  Denies fever, malaise, headache or abrupt weight changes.  HEENT: Patient reports sore throat.  Denies eye pain, eye redness, ear pain, ringing in the ears, wax buildup, runny nose, nasal congestion, bloody nose. Respiratory: Patient reports cough.  Denies difficulty breathing, shortness of breath, or sputum production.   Cardiovascular: Denies chest pain, chest tightness, palpitations or swelling in the hands or feet.  Gastrointestinal: Denies abdominal pain,  bloating, constipation, diarrhea or blood in the stool.   No other specific complaints in a complete review of systems (except as listed in HPI above).  Objective:   Physical Exam   BP 126/72 (BP Location: Right Arm, Patient Position: Sitting, Cuff Size: Normal)   Pulse 93   Temp (!) 97.1 F (36.2 C) (Temporal)   Wt 146 lb (66.2 kg)   SpO2 98%   BMI 24.30 kg/m   Wt Readings from Last 3 Encounters:  10/04/22 143 lb (64.9 kg)  06/19/22 142 lb (64.4 kg)  05/25/22 138 lb 6.4 oz (62.8 kg)    General: Appears her stated age, well developed, well nourished in NAD. Skin: Warm, dry and intact. No rashes noted. HEENT: Head: normal shape and size; Eyes: sclera white, no icterus, conjunctiva pink, PERRLA and EOMs intact; Throat/Mouth: Teeth present, mucosa pink and moist, no exudate, lesions or ulcerations noted.  Neck: No adenopathy but tender with palpation. Cardiovascular: Normal rate and rhythm. Pulmonary/Chest: Normal effort and positive vesicular breath sounds. No respiratory distress. No wheezes, rales or ronchi noted.   Neurological: Alert and oriented.  BMET    Component Value Date/Time   NA 140 10/04/2022 0836   NA 138 09/18/2014 1609   K 3.7 10/04/2022 0836   K 3.5 09/18/2014 1609   CL 103 10/04/2022 0836   CL 102 09/18/2014 1609   CO2 26 10/04/2022 0836   CO2 26 09/18/2014 1609   GLUCOSE 87 10/04/2022 0836   GLUCOSE 96 09/18/2014 1609   BUN 16 10/04/2022 0836   BUN 5 (L) 09/18/2014 1609   CREATININE 0.83 10/04/2022 0836   CALCIUM 9.9 10/04/2022 0836   CALCIUM 8.7 09/18/2014 1609   GFRNONAA >60 06/27/2020 1556   GFRNONAA >60 09/18/2014 1609   GFRNONAA >60 01/19/2012 1125   GFRAA >60 08/21/2018 1317   GFRAA >60 09/18/2014 1609   GFRAA >60 01/19/2012 1125    Lipid Panel     Component Value Date/Time   CHOL 143 10/04/2022 0836   CHOL 143 09/01/2015 1202   TRIG 37 10/04/2022 0836   HDL 59 10/04/2022 0836   HDL 65 09/01/2015 1202   CHOLHDL 2.4 10/04/2022 0836    LDLCALC 73 10/04/2022 0836    CBC    Component Value Date/Time   WBC 4.7 10/04/2022 0836   RBC 4.75 10/04/2022 0836   HGB 14.7 10/04/2022 0836   HGB 8.8 (L) 03/05/2017 0949   HCT 42.8 10/04/2022 0836   HCT 29.3 (L) 03/05/2017 0949   PLT 202 10/04/2022 0836   PLT 234 08/31/2016 0926   MCV 90.1 10/04/2022 0836   MCV 78 (L) 08/31/2016 0926   MCV 79 (L) 09/18/2014 1609  MCH 30.9 10/04/2022 0836   MCHC 34.3 10/04/2022 0836   RDW 11.4 10/04/2022 0836   RDW 15.6 (H) 08/31/2016 0926   RDW 14.5 09/18/2014 1609   LYMPHSABS 1.9 11/20/2021 1007   LYMPHSABS 1.2 08/31/2016 0926   LYMPHSABS 1.4 09/18/2014 1609   MONOABS 0.3 11/20/2021 1007   MONOABS 0.4 09/18/2014 1609   EOSABS 0.1 11/20/2021 1007   EOSABS 0.0 08/31/2016 0926   EOSABS 0.0 09/18/2014 1609   BASOSABS 0.0 11/20/2021 1007   BASOSABS 0.0 08/31/2016 0926   BASOSABS 0.0 09/18/2014 1609    Hgb A1C Lab Results  Component Value Date   HGBA1C 5.4 10/04/2022           Assessment & Plan:   Sore Throat:  Rapid strep: Negative Rapid COVID: Negative Rapid flu negative Encouraged salt water gargles Can take Ibuprofen OTC as needed for sore throat Given that she has been sleeping in the same bed with the child as strep throat, will give Rx for Omnicef 300 mg twice daily x 10 days  RTC in 5 months for follow-up of chronic conditions Webb Silversmith, NP

## 2022-10-23 NOTE — Patient Instructions (Signed)
Sore Throat A sore throat is pain, burning, irritation, or scratchiness in the throat. When you have a sore throat, you may feel pain or tenderness in your throat when you swallow or talk. Many things can cause a sore throat, including: An infection. Seasonal allergies. Dryness in the air. Irritants, such as smoke or pollution. Radiation treatment for cancer. Gastroesophageal reflux disease (GERD). A tumor. A sore throat is often the first sign of another sickness. It may happen with other symptoms, such as coughing, sneezing, fever, and swollen neck glands. Most sore throats go away without medical treatment. Follow these instructions at home:     Medicines Take over-the-counter and prescription medicines only as told by your health care provider. Children often get sore throats. Do not give your child aspirin because of the association with Reye's syndrome. Use throat sprays to soothe your throat as told by your health care provider. Managing pain To help with pain, try: Sipping warm liquids, such as broth, herbal tea, or warm water. Eating or drinking cold or frozen liquids, such as frozen ice pops. Gargling with a mixture of salt and water 3-4 times a day or as needed. To make salt water, completely dissolve -1 tsp (3-6 g) of salt in 1 cup (237 mL) of warm water. Sucking on hard candy or throat lozenges. Putting a cool-mist humidifier in your bedroom at night to moisten the air. Sitting in the bathroom with the door closed for 5-10 minutes while you run hot water in the shower. General instructions Do not use any products that contain nicotine or tobacco. These products include cigarettes, chewing tobacco, and vaping devices, such as e-cigarettes. If you need help quitting, ask your health care provider. Rest as needed. Drink enough fluid to keep your urine pale yellow. Wash your hands often with soap and water for at least 20 seconds. If soap and water are not available, use hand  sanitizer. Contact a health care provider if: You have a fever for more than 2-3 days. You have symptoms that last for more than 2-3 days. Your throat does not get better within 7 days. You have a fever and your symptoms suddenly get worse. Get help right away if: You have difficulty breathing. You cannot swallow fluids, soft foods, or your saliva. You have increased swelling in your throat or neck. You have persistent nausea and vomiting. These symptoms may represent a serious problem that is an emergency. Do not wait to see if the symptoms will go away. Get medical help right away. Call your local emergency services (911 in the [place] Do not drive yourself to the hospital. Summary A sore throat is pain, burning, irritation, or scratchiness in the throat. Many things can cause a sore throat. Take over-the-counter medicines only as told by your health care provider. Rest as needed. Drink enough fluid to keep your urine pale yellow. Contact a health care provider if your throat does not get better within 7 days. This information is not intended to replace advice given to you by your health care provider. Make sure you discuss any questions you have with your health care provider. Document Revised: 11/30/2020 Document Reviewed: 11/30/2020 Elsevier Patient Education  [employer].

## 2023-01-22 ENCOUNTER — Telehealth: Payer: Managed Care, Other (non HMO) | Admitting: Physician Assistant

## 2023-01-22 DIAGNOSIS — K529 Noninfective gastroenteritis and colitis, unspecified: Secondary | ICD-10-CM

## 2023-01-22 MED ORDER — ONDANSETRON 4 MG PO TBDP: 4.0000 mg | ORAL_TABLET | Freq: Three times a day (TID) | ORAL | 0 refills | Status: DC | PRN

## 2023-01-22 NOTE — Progress Notes (Signed)
I have spent 5 minutes in review of e-visit questionnaire, review and updating patient chart, medical decision making and response to patient.   Quida Glasser Cody Calum Cormier, PA-C    

## 2023-01-22 NOTE — Progress Notes (Signed)
E-Visit for Diarrhea  We are sorry that you are not feeling well.  Here is how we plan to help!  Based on what you have shared with me it looks like you have Acute Infectious Diarrhea.  Most cases of acute diarrhea are due to infections with virus and bacteria and are self-limited conditions lasting less than 14 days.  For your symptoms you may take Imodium 2 mg tablets that are over the counter at your local pharmacy. Take two tablet now and then one after each loose stool up to 6 a day.  Antibiotics are not needed for most people with diarrhea.  I have put a script on file at the pharmacy in case it becomes needed: Zofran 4 mg 1 tablet every 8 hours as needed for nausea and vomiting  I have sent a work note to Pharmacologist. You can find by going to the Menu on your homepage, scrolling down to the Communications section, and selecting Letters. Let us know if you have any issue locating. Take care and feel better soon!  HOME CARE We recommend changing your diet to help with your symptoms for the next few days. Drink plenty of fluids that contain water salt and sugar. Sports drinks such as Gatorade may help.  You may try broths, soups, bananas, applesauce, soft breads, mashed potatoes or crackers.  You are considered infectious for as long as the diarrhea continues. Hand washing or use of alcohol based hand sanitizers is recommend. It is best to stay out of work or school until your symptoms stop.   GET HELP RIGHT AWAY If you have dark yellow colored urine or do not pass urine frequently you should drink more fluids.   If your symptoms worsen  If you feel like you are going to pass out (faint) You have a new problem  MAKE SURE YOU  Understand these instructions. Will watch your condition. Will get help right away if you are not doing well or get worse.  Thank you for choosing an e-visit.  Your e-visit answers were reviewed by a board certified advanced clinical practitioner to  complete your personal care plan. Depending upon the condition, your plan could have included both over the counter or prescription medications.  Please review your pharmacy choice. Make sure the pharmacy is open so you can pick up prescription now. If there is a problem, you may contact your provider through [employer] and have the prescription routed to another pharmacy.  Your safety is important to [place]. If you have drug allergies check your prescription carefully.   For the next 24 hours you can use MyChart to ask questions about today's visit, request a non-urgent call back, or ask for a work or school excuse. You will get an email in the next two days asking about your experience. I hope that your e-visit has been valuable and will speed your recovery.

## 2023-02-21 ENCOUNTER — Telehealth: Payer: Managed Care, Other (non HMO) | Admitting: Emergency Medicine

## 2023-02-21 DIAGNOSIS — R197 Diarrhea, unspecified: Secondary | ICD-10-CM

## 2023-02-21 DIAGNOSIS — R112 Nausea with vomiting, unspecified: Secondary | ICD-10-CM

## 2023-02-21 MED ORDER — ONDANSETRON 4 MG PO TBDP: 4.0000 mg | ORAL_TABLET | Freq: Three times a day (TID) | ORAL | 0 refills | Status: DC | PRN

## 2023-02-21 NOTE — Progress Notes (Signed)
E-Visit for Vomiting and Diarrhea  We are sorry that you are not feeling well. Here is how we plan to help!  Based on what you have shared with me it looks like you have a Virus that is irritating your GI tract.  Vomiting is the forceful emptying of a portion of the stomach's content through the mouth.  Although nausea and vomiting can make you feel miserable, it's important to remember that these are not diseases, but rather symptoms of an underlying illness.  When we treat short term symptoms, we always caution that any symptoms that persist should be fully evaluated in a medical office.  I recommend rehydration with gatorade mixed with water or pedialyte.  I've sent a work excuse to Pharmacologist along with prescribed some Zofran to help with the nausea and vomiting.  I have prescribed a medication that will help alleviate your symptoms and allow you to stay hydrated:  Zofran 4 mg 1 tablet every 8 hours as needed for nausea and vomiting  HOME CARE: Drink clear liquids.  This is very important! Dehydration (the lack of fluid) can lead to a serious complication.  Start off with 1 tablespoon every 5 minutes for 8 hours. You may begin eating bland foods after 8 hours without vomiting.  Start with saltine crackers, white bread, rice, mashed potatoes, applesauce. After 48 hours on a bland diet, you may resume a normal diet. Try to go to sleep.  Sleep often empties the stomach and relieves the need to vomit.  GET HELP RIGHT AWAY IF:  Your symptoms do not improve or worsen within 2 days after treatment. You have a fever for over 3 days. You cannot keep down fluids after trying the medication.  MAKE SURE YOU:  Understand these instructions. Will watch your condition. Will get help right away if you are not doing well or get worse.   Thank you for choosing an e-visit.  Your e-visit answers were reviewed by a board certified advanced clinical practitioner to complete your personal care plan.  Depending upon the condition, your plan could have included both over the counter or prescription medications.  Please review your pharmacy choice. Make sure the pharmacy is open so you can pick up prescription now. If there is a problem, you may contact your provider through [employer] and have the prescription routed to another pharmacy.  Your safety is important to [place]. If you have drug allergies check your prescription carefully.   For the next 24 hours you can use MyChart to ask questions about today's visit, request a non-urgent call back, or ask for a work or school excuse. You will get an email in the next two days asking about your experience. I hope that your e-visit has been valuable and will speed your recovery.  Approximately 5 minutes was used in reviewing the patient's chart, questionnaire, prescribing medications, and documentation.

## 2023-03-20 ENCOUNTER — Encounter: Payer: Self-pay | Admitting: Oncology

## 2023-04-02 ENCOUNTER — Telehealth: Payer: Managed Care, Other (non HMO) | Admitting: Family Medicine

## 2023-04-02 ENCOUNTER — Ambulatory Visit
Admission: EM | Admit: 2023-04-02 | Discharge: 2023-04-02 | Disposition: A | Discharge status: Home / Self Care | Payer: Managed Care, Other (non HMO) | Attending: Physician Assistant | Admitting: Physician Assistant

## 2023-04-02 ENCOUNTER — Encounter: Payer: Self-pay | Admitting: Oncology

## 2023-04-02 DIAGNOSIS — R079 Chest pain, unspecified: Secondary | ICD-10-CM

## 2023-04-02 DIAGNOSIS — R051 Acute cough: Secondary | ICD-10-CM | POA: Diagnosis not present

## 2023-04-02 DIAGNOSIS — J069 Acute upper respiratory infection, unspecified: Secondary | ICD-10-CM | POA: Diagnosis not present

## 2023-04-02 DIAGNOSIS — R059 Cough, unspecified: Secondary | ICD-10-CM

## 2023-04-02 DIAGNOSIS — R519 Headache, unspecified: Secondary | ICD-10-CM | POA: Insufficient documentation

## 2023-04-02 DIAGNOSIS — Z1152 Encounter for screening for COVID-19: Secondary | ICD-10-CM | POA: Insufficient documentation

## 2023-04-02 LAB — SARS CORONAVIRUS 2 BY RT PCR: SARS Coronavirus 2 by RT PCR: NEGATIVE

## 2023-04-02 MED ORDER — KETOROLAC TROMETHAMINE 30 MG/ML IJ SOLN
30.0000 mg | Freq: Once | INTRAMUSCULAR | Status: AC
  Administered 2023-04-02: 30 mg via INTRAMUSCULAR

## 2023-04-02 NOTE — ED Provider Notes (Signed)
[facility]    CSN: [id number] Arrival date & time: 04/02/23  1023      History   Chief Complaint Chief Complaint  Patient presents with   Headache   Sinus Problem   Nausea   Emesis    HPI Sarah Edwards is a 32 y.o. female presenting for fatigue, headaches, nausea with an episode of vomiting, cough, congestion, and diarrhea x 2 days.  Denies fever, sore throat, chest pain, shortness of breath, or abdominal pain.  Has taken over-the-counter Excedrin and decongestants without relief.  She says her dad had COVID a couple weeks ago and she has been around him a couple of times.  She took a COVID test at home yesterday and says it was negative.  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 Authorizing Provider  levonorgestrel (MIRENA) 20 MCG/24HR IUD 1 each by Intrauterine route once.   Yes [provider]  ondansetron  (ZOFRAN-ODT) 4 MG disintegrating tablet Take 1 tablet (4 mg total) by mouth every 8 (eight) hours as needed for nausea or vomiting. 02/21/23  Yes Roxy Horseman, PA-C  cefdinir (OMNICEF) 300 MG capsule Take 1 capsule (300 mg total) by mouth 2 (two) times daily. 10/23/22   Lorre Munroe, NP  mesalamine (LIALDA) 1.2 g EC tablet Take 1 tablet (1.2 g total) by mouth in the morning and at bedtime. 10/04/22   Lorre Munroe, NP  omeprazole (PRILOSEC) 20 MG capsule Take 1 capsule (20 mg total) by mouth daily. Patient not taking: Reported on 10/23/2022 02/16/22   Lorre Munroe, NP    Family History Family History  Problem Relation Age of Onset   Diabetes Mother    Hypertension Mother    Diabetes Father    Heart disease Father    Hypertension Father    Heart attack Father    Coronary artery disease Father    Breast cancer Maternal Grandmother    Leukemia Maternal Grandfather    Cancer Maternal Grandfather    Breast cancer Paternal Grandmother    Ovarian cancer Paternal Grandmother    Cervical cancer  Paternal Grandmother    Lung cancer Paternal Grandfather    Cancer Paternal Aunt    Colon cancer Neg Hx     Social History Social History   Tobacco Use   Smoking status: Former    Current packs/day: 0.00    Average packs/day: 0.5 packs/day for 3.0 years (1.5 ttl pk-yrs)    Types: Cigarettes    Start date: 03/17/2012    Quit date: 03/18/2015    Years since quitting: 8.0   Smokeless tobacco: Never  Vaping Use   Vaping status: Former   Substances: THC   Devices: THC Oil  Substance Use Topics   Alcohol use: Not Currently   Drug use: Yes    Types: Marijuana    Comment: oil     Allergies   Azithromycin, Amoxicillin, Penicillins, and Sulfa antibiotics   Review of Systems Review of Systems  Constitutional:  Positive for fatigue. Negative for chills, diaphoresis and fever.  HENT:  Positive for congestion and sinus pressure. Negative for ear pain, rhinorrhea and sore throat.    Respiratory:  Positive for cough. Negative for shortness of breath.   Cardiovascular:  Negative for chest pain.  Gastrointestinal:  Positive for diarrhea, nausea and vomiting. Negative for abdominal pain.  Musculoskeletal:  Negative for arthralgias and myalgias.  Skin:  Negative for rash.  Neurological:  Positive for headaches. Negative for weakness.  Hematological:  Negative for adenopathy.     Physical Exam Triage Vital Signs ED Triage Vitals  Encounter Vitals Group     BP      Systolic BP Percentile      Diastolic BP Percentile      Pulse      Resp      Temp      Temp src      SpO2      Weight      Height      Head Circumference      Peak Flow      Pain Score      Pain Loc      Pain Education      Exclude from Growth Chart    No data found.  Updated Vital Signs BP 112/75 (BP Location: Left Arm)   Pulse 80   Temp 98.3 F (36.8 C) (Oral)   Resp 16   Ht 5\' 5"  (1.651 m)   Wt 148 lb (67.1 kg)   SpO2 98%   BMI 24.63 kg/m     Physical Exam Vitals and nursing note reviewed.  Constitutional:      General: She is not in acute distress.    Appearance: Normal appearance. She is not ill-appearing or toxic-appearing.  HENT:     Head: Normocephalic and atraumatic.     Right Ear: Tympanic membrane, ear canal and external ear normal.     Left Ear: Tympanic membrane, ear canal and external ear normal.     Nose: Congestion present.     Mouth/Throat:     Mouth: Mucous membranes are moist.     Pharynx: Oropharynx is clear.  Eyes:     General: No scleral icterus.       Right eye: No discharge.        Left eye: No discharge.     Conjunctiva/sclera: Conjunctivae normal.  Cardiovascular:     Rate and Rhythm: Normal rate and regular rhythm.     Heart sounds: Normal heart sounds.  Pulmonary:     Effort: Pulmonary effort is normal. No respiratory distress.  Breath sounds: Normal breath sounds.  Abdominal:     Palpations: Abdomen is soft.     Tenderness: There is no  abdominal tenderness.  Musculoskeletal:     Cervical back: Neck supple.  Skin:    General: Skin is dry.  Neurological:     General: No focal deficit present.     Mental Status: She is alert. Mental status is at baseline.     Motor: No weakness.     Gait: Gait normal.  Psychiatric:        Mood and Affect: Mood normal.        Behavior: Behavior normal.        Thought Content: Thought content normal.      UC Treatments / Results  Labs (all labs ordered are listed, but only abnormal results are displayed) Labs Reviewed  SARS CORONAVIRUS 2 BY RT PCR    EKG   Radiology No results found.  Procedures Procedures (including critical care time)  Medications Ordered in UC Medications  ketorolac (TORADOL) 30 MG/ML injection 30 mg (30 mg Intramuscular Given 04/02/23 1114)    Initial Impression / Assessment and Plan / UC Course  I have reviewed the triage vital signs and the nursing notes.  Pertinent labs & imaging results that were available during my care of the patient were reviewed by me and considered in my medical decision making (see chart for details).   32 y/o female presents for headaches, fatigue, cough, congestion, nausea/vomiting and diarrhea x 2 days.  Has been exposed to COVID through her dad.  Vitals normal and stable and she is overall well-appearing.  Mild congestion on exam, otherwise normal exam.  Offered 30 mg IM ketorolac for relief of acute headache and patient agrees.  PCR COVID test obtained.  Negative.  Discussed with the patient.  Viral URI.  Supportive care encouraged.  Advised use of OTC meds.  Reviewed return precautions.  Work note given.   Final Clinical Impressions(s) / UC Diagnoses   Final diagnoses:  Acute upper respiratory infection  Acute nonintractable headache, unspecified headache type  Acute cough     Discharge Instructions      URI/COLD SYMPTOMS: Your exam today is consistent with a viral illness. Antibiotics are not  indicated at this time. Use medications as directed, including cough syrup, nasal saline, and decongestants. Your symptoms should improve over the next few days and resolve within 7-10 days. Increase rest and fluids. F/u if symptoms worsen or predominate such as sore throat, ear pain, productive cough, shortness of breath, or if you develop high fevers or worsening fatigue over the next several days.    Can take ibuprofen and Tylenol for headache. Consider Mucinex D as well.     ED Prescriptions   None    PDMP not reviewed this encounter.   Shirlee Latch, PA-C 04/02/23 1132

## 2023-04-02 NOTE — ED Triage Notes (Signed)
Pt c/o HA,sinus pressure & N/V x2 days. Had 1 episode of emesis yesterday. Denies any fevers. Has tried dayquil & nyquil w/o relief. Took home covid test yesterday & was neg.

## 2023-04-02 NOTE — Progress Notes (Signed)
Since you are having chest pain- it would be best to have you seen in person- you might need a flu test or strep test.   I feel your condition warrants further evaluation and I recommend that you be seen in a face to face visit.   NOTE: There will be NO CHARGE for this eVisit

## 2023-04-02 NOTE — Discharge Instructions (Signed)
URI/COLD SYMPTOMS: Your exam today is consistent with a viral illness. Antibiotics are not indicated at this time. Use medications as directed, including cough syrup, nasal saline, and decongestants. Your symptoms should improve over the next few days and resolve within 7-10 days. Increase rest and fluids. F/u if symptoms worsen or predominate such as sore throat, ear pain, productive cough, shortness of breath, or if you develop high fevers or worsening fatigue over the next several days.    Can take ibuprofen and Tylenol for headache. Consider Mucinex D as well.

## 2023-04-29 ENCOUNTER — Ambulatory Visit: Payer: Self-pay

## 2023-04-29 ENCOUNTER — Ambulatory Visit (INDEPENDENT_AMBULATORY_CARE_PROVIDER_SITE_OTHER): Payer: Managed Care, Other (non HMO)

## 2023-04-29 ENCOUNTER — Ambulatory Visit
Admission: EM | Admit: 2023-04-29 | Discharge: 2023-04-29 | Disposition: A | Discharge status: Home / Self Care | Payer: Managed Care, Other (non HMO) | Attending: Family Medicine | Admitting: Family Medicine

## 2023-04-29 DIAGNOSIS — J22 Unspecified acute lower respiratory infection: Secondary | ICD-10-CM

## 2023-04-29 DIAGNOSIS — R0789 Other chest pain: Secondary | ICD-10-CM

## 2023-04-29 MED ORDER — PROMETHAZINE-DM 6.25-15 MG/5ML PO SYRP: 5.0000 mL | ORAL_SOLUTION | Freq: Four times a day (QID) | ORAL | 0 refills | Status: DC | PRN

## 2023-04-29 MED ORDER — PREDNISONE 20 MG PO TABS: 40.0000 mg | ORAL_TABLET | Freq: Every day | ORAL | 0 refills | Status: DC

## 2023-04-29 MED ORDER — CEFDINIR 300 MG PO CAPS: 300.0000 mg | ORAL_CAPSULE | Freq: Two times a day (BID) | ORAL | 0 refills | Status: AC

## 2023-04-29 NOTE — ED Triage Notes (Signed)
Patient to Urgent Care with complaints of productive cough w/ brown and green sputum production. Cough is mostly dry. Congestion in her chest. Denies any recent fevers.  Reports symptoms started four weeks ago.  Has been taking sudafed/ mucinex DM.

## 2023-04-29 NOTE — Telephone Encounter (Signed)
  Chief Complaint: Cough - Mild SOB - green brown Phlegm Symptoms: above + chills and sweats Frequency: 4 weeks Pertinent Negatives: Patient denies fever Disposition: [] ED /[x] Urgent Care (no appt availability in office) / [] Appointment(In office/virtual)/ []  [facility]/ [] Home Care/ [] Refused Recommended Disposition /[] [facility]/ []  Follow-up with PCP Additional Notes: Pt has had URI for 4 weeks. Cough continues with mild sob. No appts in office pt will go to UC.    Reason for Disposition  [1] MILD difficulty breathing (e.g., minimal/no SOB at rest, SOB with walking, pulse <100) AND [2] still present when not coughing  Answer Assessment - Initial Assessment Questions 1. ONSET: "When did the cough begin?"      4 weeks 2. SEVERITY: "How bad is the cough today?"      moderate 3. SPUTUM: "Describe the color of your sputum" (none, dry cough; clear, white, yellow, green)     Green - brown 4. HEMOPTYSIS: "Are you coughing up any blood?" If so ask: "How much?" (flecks, streaks, tablespoons, etc.)     no 5. DIFFICULTY BREATHING: "Are you having difficulty breathing?" If Yes, ask: "How bad is it?" (e.g., mild, moderate, severe)    - MILD: No SOB at rest, mild SOB with walking, speaks normally in sentences, can lie down, no retractions, pulse < 100.    - MODERATE: SOB at rest, SOB with minimal exertion and prefers to sit, cannot lie down flat, speaks in phrases, mild retractions, audible wheezing, pulse 100-120.    - SEVERE: Very SOB at rest, speaks in single words, struggling to breathe, sitting hunched forward, retractions, pulse > 120      mild 6. FEVER: "Do you have a fever?" If Yes, ask: "What is your temperature, how was it measured, and when did it start?"     no 7. CARDIAC HISTORY: "Do you have any history of heart disease?" (e.g., heart attack, congestive heart failure)      no 8. LUNG HISTORY: "Do you have any history of lung disease?"  (e.g., pulmonary  embolus, asthma, emphysema)     no 10. OTHER SYMPTOMS: "Do you have any other symptoms?" (e.g., runny nose, wheezing, chest pain)       Chills - sweats  Protocols used: Cough - Acute Productive-A-AH

## 2023-04-29 NOTE — ED Provider Notes (Signed)
Sarah Edwards    CSN: [id number] Arrival date & time: 04/29/23  1637      History   Chief Complaint Chief Complaint  Patient presents with   Cough    HPI Sarah Edwards is a 32 y.o. female.   HPI Patient presents today with a 4 week history of cough, nasal congestion and nasal drainage. He was seen at [facility] urgent care back in July when illness for started and has tried multiple over-the-counter medications without any improvement of current symptoms.  She has not had fever.  She reports over the last week and a half the cough has worsened to the point is interfering with her sleep.  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 Authorizing Provider  cefdinir (OMNICEF) 300 MG capsule Take 1 capsule (300 mg total) by mouth 2 (two) times daily. 10/23/22   Lorre Munroe, NP  levonorgestrel (MIRENA) 20 MCG/24HR IUD 1 each by Intrauterine  route once.    [provider]  mesalamine (LIALDA) 1.2 g EC tablet Take 1 tablet (1.2 g total) by mouth in the morning and at bedtime. 10/04/22   Lorre Munroe, NP  omeprazole (PRILOSEC) 20 MG capsule Take 1 capsule (20 mg total) by mouth daily. Patient not taking: Reported on 10/23/2022 02/16/22   Lorre Munroe, NP  ondansetron (ZOFRAN-ODT) 4 MG disintegrating tablet Take 1 tablet (4 mg total) by mouth every 8 (eight) hours as needed for nausea or vomiting. 02/21/23   Roxy Horseman, PA-C    Family History Family History  Problem Relation Age of Onset   Diabetes Mother    Hypertension Mother    Diabetes Father    Heart disease Father    Hypertension Father    Heart attack Father    Coronary artery disease Father    Breast cancer Maternal Grandmother    Leukemia Maternal Grandfather    Cancer Maternal Grandfather    Breast cancer Paternal Grandmother    Ovarian cancer Paternal Grandmother    Cervical cancer Paternal Grandmother    Lung cancer Paternal Grandfather    Cancer  Paternal Aunt    Colon cancer Neg Hx     Social History Social History   Tobacco Use   Smoking status: Former    Current packs/day: 0.00    Average packs/day: 0.5 packs/day for 3.0 years (1.5 ttl pk-yrs)    Types: Cigarettes    Start date: 03/17/2012    Quit date: 03/18/2015    Years since quitting: 8.1   Smokeless tobacco: Never  Vaping Use   Vaping status: Former   Substances: THC   Devices: THC Oil  Substance Use Topics   Alcohol use: Not Currently   Drug use: Yes    Types: Marijuana    Comment: oil     Allergies   Azithromycin, Amoxicillin, Penicillins, and Sulfa antibiotics   Review of Systems Review of Systems Pertinent negatives listed in HPI   Physical Exam Triage Vital Signs ED Triage Vitals  Encounter Vitals Group     BP 04/29/23 1743 120/85     Systolic BP Percentile --      Diastolic BP Percentile --      Pulse Rate 04/29/23 1743 86     Resp 04/29/23 1743 18      Temp 04/29/23 1743 98.3 F (36.8 C)     Temp src --      SpO2 04/29/23 1743 95 %     Weight --      Height --      Head Circumference --      Peak Flow --      Pain Score 04/29/23 1739 0     Pain Loc --      Pain Education --      Exclude from Growth Chart --    No data found.  Updated Vital Signs BP 120/85   Pulse 86   Temp 98.3 F (36.8 C)   Resp 18   SpO2 95%   Visual Acuity Right Eye Distance:   Left Eye Distance:   Bilateral Distance:    Right Eye Near:   Left Eye Near:    Bilateral Near:     Physical Exam  UC Treatments / Results  Labs (all labs ordered are listed, but only abnormal results are displayed) Labs Reviewed - No data to display  EKG   Radiology DG Chest 2 View  Result Date: 04/29/2023 CLINICAL DATA:  Chest pain, shortness of breath EXAM: CHEST - 2 VIEW COMPARISON:  09/23/2016 FINDINGS: The heart size and mediastinal contours are within normal limits. Both lungs are clear. The visualized skeletal structures are unremarkable. IMPRESSION: No active cardiopulmonary disease. Electronically Signed   By: Ernie Avena M.D.   On: 04/29/2023 17:56    Procedures Procedures (including critical care time)  Medications Ordered in UC Medications - No data to display  Initial Impression / Assessment and Plan / UC Course  I have reviewed the triage vital signs and the nursing notes.  Pertinent labs & imaging results that were available during my care of the patient were reviewed by me and considered in my medical decision making (see chart for details).     *** Final Clinical Impressions(s) / UC Diagnoses   Final diagnoses:  Chest tightness  Lower respiratory infection   Discharge Instructions   None    ED Prescriptions   None    PDMP not reviewed this encounter.

## 2023-04-30 NOTE — Telephone Encounter (Signed)
Noted  

## 2023-10-01 ENCOUNTER — Encounter: Payer: Self-pay | Admitting: Internal Medicine

## 2023-10-01 ENCOUNTER — Ambulatory Visit (INDEPENDENT_AMBULATORY_CARE_PROVIDER_SITE_OTHER): Payer: Self-pay | Admitting: Internal Medicine

## 2023-10-01 ENCOUNTER — Encounter: Payer: Self-pay | Admitting: Oncology

## 2023-10-01 VITALS — BP 112/68 | Ht 65.0 in | Wt 160.2 lb

## 2023-10-01 DIAGNOSIS — R739 Hyperglycemia, unspecified: Secondary | ICD-10-CM

## 2023-10-01 DIAGNOSIS — E559 Vitamin D deficiency, unspecified: Secondary | ICD-10-CM

## 2023-10-01 DIAGNOSIS — Z136 Encounter for screening for cardiovascular disorders: Secondary | ICD-10-CM

## 2023-10-01 DIAGNOSIS — R61 Generalized hyperhidrosis: Secondary | ICD-10-CM

## 2023-10-01 DIAGNOSIS — F419 Anxiety disorder, unspecified: Secondary | ICD-10-CM

## 2023-10-01 DIAGNOSIS — E538 Deficiency of other specified B group vitamins: Secondary | ICD-10-CM

## 2023-10-01 DIAGNOSIS — R6889 Other general symptoms and signs: Secondary | ICD-10-CM

## 2023-10-01 DIAGNOSIS — F439 Reaction to severe stress, unspecified: Secondary | ICD-10-CM

## 2023-10-01 NOTE — Progress Notes (Signed)
 Subjective:    Patient ID: Sarah Edwards, female    DOB: [birth year], 33 y.o.   MRN: [id number]  HPI  Discussed the use of AI scribe software for clinical note transcription with the patient, who gave verbal consent to proceed.  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 The sweating is most prominent in the underarms, under the breasts, and hands. The patient has also been experiencing significant life changes and stressors, including transitioning from work to being a stay-at-home parent, potential custody issues with her daughter, and financial stress.  The patient reports difficulty sleeping, often waking up at 3 am and unable to fall back asleep until 5 am. She also reports feeling overwhelmed and anxious, but is not currently seeing a therapist. Despite these symptoms, the patient denies any changes in diet or medication.  The patient also reports feeling cold yet sweating simultaneously, raising concerns about a potential return of her anemia. She has been consuming iron -rich foods to manage this condition. The patient has a Mirena  IUD and has not been experiencing any periods.  The patient's symptoms seem to worsen during the winter months. Despite these symptoms, the patient denies any family history of thyroid  issues. The patient has requested a full panel of blood work, including thyroid  function tests, to rule out any underlying conditions.       Review of Systems   Past Medical History:  Diagnosis Date   Abnormal uterine bleeding (AUB)    Anemia    Anemia during pregnancy in third trimester 03/21/2017   Anxiety    Bright red rectal bleeding 08/19/2018   Chronic UTI    Crohn disease (HCC)    Endometriosis    IBS (irritable bowel syndrome)    Painful menstrual periods    SAB (spontaneous abortion) 01/06/2014    Current Outpatient  Medications  Medication Sig Dispense Refill   levonorgestrel  (MIRENA ) 20 MCG/24HR IUD 1 each by Intrauterine route once.     mesalamine  (LIALDA ) 1.2 g EC tablet Take 1 tablet (1.2 g total) by mouth in the morning and at bedtime. 180 tablet 0   omeprazole  (PRILOSEC) 20 MG capsule Take 1 capsule (20 mg total) by mouth daily. (Patient not taking: Reported on 10/23/2022) 30 capsule 0   ondansetron  (ZOFRAN -ODT) 4 MG disintegrating tablet Take 1 tablet (4 mg total) by mouth every 8 (eight) hours as needed for nausea or vomiting. 10 tablet 0   predniSONE  (DELTASONE ) 20 MG tablet Take 2 tablets (40 mg total) by mouth daily with breakfast. 10 tablet 0   promethazine -dextromethorphan (PROMETHAZINE -DM) 6.25-15 MG/5ML syrup Take 5 mLs by mouth 4 (four) times daily as needed for cough. 180 mL 0   No current facility-administered medications for this visit.    Allergies  Allergen Reactions   Azithromycin Swelling   Amoxicillin Rash   Penicillins Rash    Childhood reaction   Sulfa Antibiotics Nausea And Vomiting    Constant vomiting    Family History  Problem Relation Age of Onset   Diabetes Mother    Hypertension Mother    Diabetes Father    Heart disease Father    Hypertension Father    Heart attack Father    Coronary artery disease Father    Breast cancer Maternal Grandmother    Leukemia Maternal Grandfather    Cancer Maternal Grandfather    Breast cancer Paternal  Grandmother    Ovarian cancer Paternal Grandmother    Cervical cancer Paternal Grandmother    Lung cancer Paternal Grandfather    Cancer Paternal Aunt    Colon cancer Neg Hx     Social History   Socioeconomic History   Marital status: Married    Spouse name: Not on file   Number of children: 2   Years of education: Not on file   Highest education level: Not on file  Occupational History    Comment: Investment Banker, [employer]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  Other Topics Concern   Not on file  Social History Narrative   Lives in [place] with husband and dtr.  Does not routinely exercise.   Social Drivers of Corporate Investment Banker Strain: Not on file  Food Insecurity: Not on file  Transportation Needs: Not on file  Physical Activity: Not on file  Stress: Not on file  Social Connections: Not on file  Intimate Partner Violence: Not on file     Constitutional: Denies fever, malaise, fatigue, headache or abrupt weight changes.  HEENT: Denies eye pain, eye redness, ear pain, ringing in the ears, wax buildup, runny nose, nasal congestion, bloody nose, or sore throat. Respiratory: Denies difficulty breathing, shortness of breath, cough or sputum production.   Cardiovascular: Denies chest pain, chest tightness, palpitations or swelling in the hands or feet.  Gastrointestinal: Denies abdominal pain, bloating, constipation, diarrhea or blood in the stool.  GU: Denies urgency, frequency, pain with urination, burning sensation, blood in urine, odor or discharge. Musculoskeletal: Denies decrease in range of motion, difficulty with gait, muscle pain or joint pain and swelling.  Skin: Denies redness, rashes, lesions or ulcercations.  Neurological: Pt reports night sweats, cold intolerance. Denies dizziness, difficulty with memory, difficulty with speech or problems with balance and coordination.  Psych: Patient has a history of anxiety.  Denies depression, SI/HI.  No other specific complaints in a complete review of systems  (except as listed in HPI above).      Objective:   Physical Exam  BP 112/68 (BP Location: Left Arm, Patient Position: Sitting, Cuff Size: Normal)   Ht 5' 5 (1.651 m)   Wt 160 lb 3.2 oz (72.7 kg)   BMI 26.66 kg/m   Wt Readings from Last 3 Encounters:  04/02/23 148 lb (67.1 kg)  10/23/22 146 lb (66.2 kg)  10/04/22 143 lb (64.9 kg)    General: Appears here stated age, overweight, in NAD. Skin: Warm, dry and intact. Neck:  Neck supple, trachea midline. No masses, lumps or thyromegaly present.  Cardiovascular: Normal rate and rhythm. S1,S2 noted.  No murmur, rubs or gallops noted.  Pulmonary/Chest: Normal effort and positive vesicular breath sounds. No respiratory distress. No wheezes, rales or ronchi noted.  Musculoskeletal: No difficulty with gait.  Neurological: Alert and oriented. Coordination normal.  Psych: Mildly anxious appearing.  BMET    Component Value Date/Time   NA 140 10/04/2022 0836   NA 138 09/18/2014 1609   K 3.7 10/04/2022 0836   K 3.5 09/18/2014 1609   CL 103 10/04/2022 0836   CL 102 09/18/2014 1609   CO2 26 10/04/2022 0836   CO2 26 09/18/2014 1609   GLUCOSE 87 10/04/2022 0836   GLUCOSE 96 09/18/2014 1609   BUN 16 10/04/2022 0836   BUN 5 (L) 09/18/2014 1609   CREATININE 0.83 10/04/2022 0836   CALCIUM  9.9 10/04/2022 0836   CALCIUM  8.7 09/18/2014 1609   GFRNONAA >60 06/27/2020 1556   GFRNONAA >60 09/18/2014 1609   GFRNONAA >60 01/19/2012 1125   GFRAA >60 08/21/2018 1317   GFRAA >60 09/18/2014 1609   GFRAA >60 01/19/2012 1125    Lipid Panel     Component Value Date/Time   CHOL 143 10/04/2022 0836   CHOL 143 09/01/2015 1202   TRIG 37 10/04/2022 0836   HDL 59 10/04/2022 0836   HDL 65 09/01/2015 1202   CHOLHDL 2.4 10/04/2022 0836   LDLCALC 73 10/04/2022 0836    CBC    Component Value Date/Time   WBC 4.7 10/04/2022 0836   RBC 4.75 10/04/2022 0836   HGB 14.7 10/04/2022 0836   HGB 8.8 (L) 03/05/2017 0949   HCT 42.8 10/04/2022 0836    HCT 29.3 (L) 03/05/2017 0949   PLT 202 10/04/2022 0836   PLT 234 08/31/2016 0926   MCV 90.1 10/04/2022 0836   MCV 78 (L) 08/31/2016 0926   MCV 79 (L) 09/18/2014 1609   MCH 30.9 10/04/2022 0836   MCHC 34.3 10/04/2022 0836   RDW 11.4 10/04/2022 0836   RDW 15.6 (H) 08/31/2016 0926   RDW 14.5 09/18/2014 1609   LYMPHSABS 1.9 11/20/2021 1007   LYMPHSABS 1.2 08/31/2016 0926   LYMPHSABS 1.4 09/18/2014 1609   MONOABS 0.3 11/20/2021 1007   MONOABS 0.4 09/18/2014 1609   EOSABS 0.1 11/20/2021 1007   EOSABS 0.0 08/31/2016 0926   EOSABS 0.0 09/18/2014 1609   BASOSABS 0.0 11/20/2021 1007   BASOSABS 0.0 08/31/2016 0926   BASOSABS 0.0 09/18/2014 1609    Hgb A1C Lab Results  Component Value Date   HGBA1C 5.4 10/04/2022            Assessment & Plan:   Assessment and Plan    Excessive Sweating New onset of excessive sweating, particularly at night, for the past few months. No new medications or dietary changes. Patient is under significant stress due to recent life changes. No family history of thyroid  disorders. -Order comprehensive blood panel including thyroid  function tests, vitamin D , B12, and anemia screening.  Stress and Anxiety Patient reports feeling overwhelmed and stressed due to recent life changes including becoming a stay-at-home mom and potential custody issues with her daughter. Patient is not currently seeing a therapist and is unsure about starting therapy due to financial concerns. -Discussed the potential benefits of therapy. Encouraged patient to consider therapy if stress and anxiety continue to be problematic.   Schedule an appointment for your annual exam Angeline Laura, NP

## 2023-10-01 NOTE — Patient Instructions (Signed)

## 2023-10-02 ENCOUNTER — Encounter: Payer: Self-pay | Admitting: Oncology

## 2023-10-02 LAB — COMPLETE METABOLIC PANEL WITH GFR
AG Ratio: 1.9 (calc) (ref 1.0–2.5)
ALT: 23 U/L (ref 6–29)
AST: 19 U/L (ref 10–30)
Albumin: 5 g/dL (ref 3.6–5.1)
Alkaline phosphatase (APISO): 66 U/L (ref 31–125)
BUN: 11 mg/dL (ref 7–25)
CO2: 28 mmol/L (ref 20–32)
Calcium: 9.6 mg/dL (ref 8.6–10.2)
Chloride: 104 mmol/L (ref 98–110)
Creat: 0.74 mg/dL (ref 0.50–0.97)
Globulin: 2.7 g/dL (ref 1.9–3.7)
Glucose, Bld: 107 mg/dL (ref 65–139)
Potassium: 3.9 mmol/L (ref 3.5–5.3)
Sodium: 139 mmol/L (ref 135–146)
Total Bilirubin: 0.4 mg/dL (ref 0.2–1.2)
Total Protein: 7.7 g/dL (ref 6.1–8.1)
eGFR: 111 mL/min/{1.73_m2} (ref >=60)

## 2023-10-02 LAB — CBC
HCT: 40.6 % (ref 35.0–45.0)
Hemoglobin: 13.6 g/dL (ref 11.7–15.5)
MCH: 30.2 pg (ref 27.0–33.0)
MCHC: 33.5 g/dL (ref 32.0–36.0)
MCV: 90 fL (ref 80.0–100.0)
MPV: 11.4 fL (ref 7.5–12.5)
Platelets: 240 10*3/uL (ref 140–400)
RBC: 4.51 10*6/uL (ref 3.80–5.10)
RDW: 11.7 % (ref 11.0–15.0)
WBC: 6.4 10*3/uL (ref 3.8–10.8)

## 2023-10-02 LAB — LIPID PANEL
Cholesterol: 166 mg/dL (ref <200)
HDL: 50 mg/dL (ref >=50)
LDL Cholesterol (Calc): 97 mg/dL
Non-HDL Cholesterol (Calc): 116 mg/dL (ref <130)
Total CHOL/HDL Ratio: 3.3 (calc) (ref <5.0)
Triglycerides: 101 mg/dL (ref <150)

## 2023-10-02 LAB — TSH: TSH: 2.42 m[IU]/L

## 2023-10-02 LAB — VITAMIN B12: Vitamin B-12: 791 pg/mL (ref 200–1100)

## 2023-10-02 LAB — VITAMIN D 25 HYDROXY (VIT D DEFICIENCY, FRACTURES): Vit D, 25-Hydroxy: 29 ng/mL — ABNORMAL LOW (ref 30–100)

## 2023-10-02 LAB — HEMOGLOBIN A1C
Hgb A1c MFr Bld: 5.4 %{Hb} (ref <5.7)
Mean Plasma Glucose: 108 mg/dL
eAG (mmol/L): 6 mmol/L

## 2023-11-19 ENCOUNTER — Ambulatory Visit (INDEPENDENT_AMBULATORY_CARE_PROVIDER_SITE_OTHER): Admitting: Internal Medicine

## 2023-11-19 ENCOUNTER — Encounter: Payer: Self-pay | Admitting: Oncology

## 2023-11-19 ENCOUNTER — Encounter: Payer: Self-pay | Admitting: Internal Medicine

## 2023-11-19 VITALS — BP 122/64 | Ht 65.0 in | Wt 156.8 lb

## 2023-11-19 DIAGNOSIS — Z0001 Encounter for general adult medical examination with abnormal findings: Secondary | ICD-10-CM

## 2023-11-19 DIAGNOSIS — R0781 Pleurodynia: Secondary | ICD-10-CM

## 2023-11-19 DIAGNOSIS — Z6826 Body mass index (BMI) 26.0-26.9, adult: Secondary | ICD-10-CM | POA: Insufficient documentation

## 2023-11-19 DIAGNOSIS — G8929 Other chronic pain: Secondary | ICD-10-CM

## 2023-11-19 DIAGNOSIS — R1012 Left upper quadrant pain: Secondary | ICD-10-CM

## 2023-11-19 DIAGNOSIS — E663 Overweight: Secondary | ICD-10-CM | POA: Diagnosis not present

## 2023-11-19 DIAGNOSIS — R112 Nausea with vomiting, unspecified: Secondary | ICD-10-CM | POA: Diagnosis not present

## 2023-11-19 NOTE — Progress Notes (Signed)
 Subjective:    Patient ID: Sarah Edwards, female    DOB: 1990/10/25, 33 y.o.   MRN: [id number]  HPI  Patient presents to clinic today for her annual exam.  She also reports pain under the left side of her rib cage when she takes deep breath. She noticed this 2-3 months ago. She is having skin sensitivity. The pain worsens with taking a deep breath., bending , twisting and lifting. She has not noticed a rash in the area. She can not wear a bra because it hurts too bad. She has intermittent nausea and vomiting which she relates to her chronic GI issues. She has tried ibuprofen OTC with minimal relief of symptoms.   Flu: 06/2023 Tetanus: 01/2017 COVID: Pfizer x 2 Pap smear: 08/2021 Dentist: biannually  Diet: She does eat meat. She consumes fruits and veggies. She does not eat fried foods. She drinks mostly water and coffee. Exercise: None  Review of Systems  Past Medical History:  Diagnosis Date   Abnormal uterine bleeding (AUB)    Anemia    Anemia during pregnancy in third trimester 03/21/2017   Anxiety    Bright red rectal bleeding 08/19/2018   Chronic UTI    Crohn disease (HCC)    Endometriosis    IBS (irritable bowel syndrome)    Painful menstrual periods    SAB (spontaneous abortion) 01/06/2014    Current Outpatient Medications  Medication Sig Dispense Refill   levonorgestrel (MIRENA) 20 MCG/24HR IUD 1 each by Intrauterine route once.     omeprazole (PRILOSEC) 20 MG capsule Take 1 capsule (20 mg total) by mouth daily. (Patient not taking: Reported on 10/01/2023) 30 capsule 0   No current facility-administered medications for this visit.    Allergies  Allergen Reactions   Azithromycin Swelling   Amoxicillin Rash   Penicillins Rash    Childhood reaction   Sulfa Antibiotics Nausea And Vomiting    Constant vomiting    Family History  Problem Relation Age of Onset   Diabetes Mother    Hypertension Mother    Diabetes Father    Heart disease Father    Hypertension  Father    Heart attack Father    Coronary artery disease Father    Breast cancer Maternal Grandmother    Leukemia Maternal Grandfather    Cancer Maternal Grandfather    Breast cancer Paternal Grandmother    Ovarian cancer Paternal Grandmother    Cervical cancer Paternal Grandmother    Lung cancer Paternal Grandfather    Cancer Paternal Aunt    Colon cancer Neg Hx     Social History   Socioeconomic History   Marital status: Married    Spouse name: Not on file   Number of children: 2   Years of education: Not on file   Highest education level: Not on file  Occupational History    Comment: Investment banker, 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  Other Topics Concern   Not on file  Social History Narrative   Lives in [place] with husband and dtr.  Does not routinely exercise.   Social Drivers of Corporate investment banker Strain: Not on file  Food Insecurity: Not on file  Transportation Needs: Not on file  Physical Activity: Not on file  Stress: Not on file  Social Connections: Not on file  Intimate Partner Violence: Not on file     Constitutional: Denies headache, fever, malaise, fatigue or abrupt weight changes.  HEENT: Denies eye pain, eye redness, ear pain, ringing in the ears, wax buildup, runny nose, nasal congestion, bloody nose, or sore throat. Respiratory: Denies difficulty breathing, shortness of breath, cough or sputum production.   Cardiovascular: Denies chest pain, chest  tightness, palpitations or swelling in the hands or feet.  Gastrointestinal: Patient reports LUQ abdominal pain, intermittent nausea and vomiting.  Denies abdominal pain, bloating, constipation, diarrhea or blood in the stool.  GU: Denies urgency, frequency, pain with urination, burning sensation, blood in urine, odor or discharge. Musculoskeletal: Patient reports pain under her left rib cage.  Denies decrease in range of motion, difficulty with gait, or joint swelling.  Skin: Denies redness, rashes, lesions or ulcercations.  Neurological: Denies dizziness, difficulty with memory, difficulty with speech or problems with balance and coordination.  Psych: Patient has a history of anxiety.  Denies depression, SI/HI.  No other specific complaints in a complete review of systems (except as listed in HPI above).     Objective:   Physical Exam  BP 122/64 (BP Location: Left Arm, Patient Position: Sitting, Cuff Size: Normal)   Ht 5\' 5"  (1.651 m)   Wt 156 lb 12.8 oz (71.1 kg)   BMI 26.09 kg/m    Wt Readings from Last 3 Encounters:  10/01/23 160 lb 3.2 oz (72.7 kg)  04/02/23 148 lb (67.1 kg)  10/23/22 146 lb (66.2 kg)    General: Appears her stated age, well developed, well nourished in NAD. Skin: Warm, dry and intact. No rashes noted. HEENT: Head: normal shape and size; Eyes: sclera white, no icterus, conjunctiva pink, PERRLA and EOMs intact; Ears: Tm's gray and intact, distorted light reflex, scarring noted of bilateral TMs; Throat/Mouth: Teeth present, mucosa pink and moist, no exudate, lesions or ulcerations noted.  Neck:  Neck supple, trachea midline. No masses, lumps or thyromegaly present.  Cardiovascular: Tachycardic with normal rhythm. S1,S2 noted.  No murmur, rubs or gallops noted. No JVD or BLE edema.  Pulmonary/Chest: Normal effort and positive vesicular breath sounds. No respiratory distress. No wheezes, rales or ronchi noted.  Abdomen: Soft and tender in the LUQ region. Normal  bowel sounds. No distention or masses noted. Liver, spleen and kidneys non palpable. Musculoskeletal: Pain with palpation of the left lower anterior ribs.  Strength 5/5 BUE/BLE.  No difficulty with gait.  Neurological: Alert and oriented. Cranial nerves II-XII grossly intact. Coordination normal.  Psychiatric: Mood and affect normal.  Mildly anxious appearing. Judgment and thought content normal.     BMET    Component Value Date/Time   NA 139 10/01/2023 1508   NA 138 09/18/2014 1609   K 3.9 10/01/2023 1508   K 3.5 09/18/2014 1609   CL 104 10/01/2023 1508   CL 102 09/18/2014 1609   CO2 28 10/01/2023 1508   CO2 26 09/18/2014 1609   GLUCOSE 107 10/01/2023 1508   GLUCOSE 96 09/18/2014 1609   BUN 11 10/01/2023  1508   BUN 5 (L) 09/18/2014 1609   CREATININE 0.74 10/01/2023 1508   CALCIUM 9.6 10/01/2023 1508   CALCIUM 8.7 09/18/2014 1609   GFRNONAA >60 06/27/2020 1556   GFRNONAA >60 09/18/2014 1609   GFRNONAA >60 01/19/2012 1125   GFRAA >60 08/21/2018 1317   GFRAA >60 09/18/2014 1609   GFRAA >60 01/19/2012 1125    Lipid Panel     Component Value Date/Time   CHOL 166 10/01/2023 1508   CHOL 143 09/01/2015 1202   TRIG 101 10/01/2023 1508   HDL 50 10/01/2023 1508   HDL 65 09/01/2015 1202   CHOLHDL 3.3 10/01/2023 1508   LDLCALC 97 10/01/2023 1508    CBC    Component Value Date/Time   WBC 6.4 10/01/2023 1508   RBC 4.51 10/01/2023 1508   HGB 13.6 10/01/2023 1508   HGB 8.8 (L) 03/05/2017 0949   HCT 40.6 10/01/2023 1508   HCT 29.3 (L) 03/05/2017 0949   PLT 240 10/01/2023 1508   PLT 234 08/31/2016 0926   MCV 90.0 10/01/2023 1508   MCV 78 (L) 08/31/2016 0926   MCV 79 (L) 09/18/2014 1609   MCH 30.2 10/01/2023 1508   MCHC 33.5 10/01/2023 1508   RDW 11.7 10/01/2023 1508   RDW 15.6 (H) 08/31/2016 0926   RDW 14.5 09/18/2014 1609   LYMPHSABS 1.9 11/20/2021 1007   LYMPHSABS 1.2 08/31/2016 0926   LYMPHSABS 1.4 09/18/2014 1609   MONOABS 0.3 11/20/2021 1007   MONOABS 0.4  09/18/2014 1609   EOSABS 0.1 11/20/2021 1007   EOSABS 0.0 08/31/2016 0926   EOSABS 0.0 09/18/2014 1609   BASOSABS 0.0 11/20/2021 1007   BASOSABS 0.0 08/31/2016 0926   BASOSABS 0.0 09/18/2014 1609    Hgb A1C Lab Results  Component Value Date   HGBA1C 5.4 10/01/2023           Assessment & Plan:   Preventative Health Maintenance:  Flu shot UTD Tetanus UTD Encouraged her to get her COVID booster Pap smear UTD Encouraged her to consume a balanced diet and exercise regimen Advised her to see a dentist annually Recent labs reviewed  LUQ abdominal pain, left side rib pain, intermittent nausea and vomiting:  Will Check H. pylori breath test Referral to GI for further evaluation and treatment  RTC in 6 months, follow-up chronic conditions Nicki Reaper, NP

## 2023-11-19 NOTE — Patient Instructions (Signed)

## 2023-11-19 NOTE — Assessment & Plan Note (Signed)
 Encouraged diet and exercise for weight loss ?

## 2023-11-20 LAB — H. PYLORI BREATH TEST: H. pylori Breath Test: NOT DETECTED

## 2023-11-21 ENCOUNTER — Encounter: Payer: Self-pay | Admitting: Oncology

## 2023-11-21 ENCOUNTER — Encounter: Payer: Self-pay | Admitting: Internal Medicine

## 2023-11-22 ENCOUNTER — Ambulatory Visit
Admission: RE | Admit: 2023-11-22 | Discharge: 2023-11-22 | Disposition: A | Discharge status: Home / Self Care | Source: Ambulatory Visit | Attending: Internal Medicine | Admitting: Internal Medicine

## 2023-11-22 ENCOUNTER — Ambulatory Visit

## 2023-11-22 DIAGNOSIS — G8929 Other chronic pain: Secondary | ICD-10-CM

## 2023-11-25 ENCOUNTER — Ambulatory Visit: Payer: Self-pay | Admitting: Internal Medicine

## 2023-11-25 ENCOUNTER — Ambulatory Visit: Admission: RE | Admit: 2023-11-25 | Payer: Self-pay | Source: Ambulatory Visit

## 2023-11-25 NOTE — Telephone Encounter (Signed)
  Chief Complaint: CT scan results, new symptoms- nausea, feels like she has lump in her throat Symptoms: patient states since the CT scan she has new onset nausea, and feels like there is a lump in her thought. Patient states she is afraid to vomit because her pain is so bad that she states it makes it hard to breath. Patient advised ED if she she have trouble swallowing, excessive saliva- or increased SOB.  Frequency: Appointment 11/19/23- but patient states her symptoms started September/October Pertinent Negatives: Patient denies vomiting Disposition: [] ED /[] Urgent Care (no appt availability in office) / [] Appointment(In office/virtual)/ []  [facility]/ [] Home Care/ [x] Refused Recommended Disposition /[] [facility]/ []  Follow-up with PCP Additional Notes: Patient states she has her child with her and will have to wait for her husband-  she does not want to vomit. No open appointment today- will send message about increased symptoms- PCP may have her go to ED  .Copied from CRM [id number]. Topic: Clinical - Red Word Triage >> Nov 25, 2023  1:21 PM Tiffany S wrote: Red Word that prompted transfer to Nurse Triage: Patient had CT done Friday at 1 patient stated diarrhea/nauseous says she can't breathe when she is vomiting Reason for Disposition  [1] MILD difficulty breathing (e.g., minimal/no SOB at rest, SOB with walking, pulse <100) AND [2] NEW-onset or WORSE than normal  Answer Assessment - Initial Assessment Questions 1. RESPIRATORY STATUS: "Describe your breathing?" (e.g., wheezing, shortness of breath, unable to speak, severe coughing)      Patient is having SOB from pressure under her ribs on the left side 2. ONSET: "When did this breathing problem begin?"      September/October- thought injury 3. PATTERN "Does the difficult breathing come and go, or has it been constant since it started?"      Constant- patient feel is helpless- getting worse 4. SEVERITY: "How bad is  your breathing?" (e.g., mild, moderate, severe)    - MILD: No SOB at rest, mild SOB with walking, speaks normally in sentences, can lie down, no retractions, pulse < 100.    - MODERATE: SOB at rest, SOB with minimal exertion and prefers to sit, cannot lie down flat, speaks in phrases, mild retractions, audible wheezing, pulse 100-120.    - SEVERE: Very SOB at rest, speaks in single words, struggling to breathe, sitting hunched forward, retractions, pulse > 120      If patient lays the wrong way- her breath is taken away 5. RECURRENT SYMPTOM: "Have you had difficulty breathing before?" If Yes, ask: "When was the last time?" and "What happened that time?"      no  8. CAUSE: "What do you think is causing the breathing problem?"      unsure 9. OTHER SYMPTOMS: "Do you have any other symptoms? (e.g., dizziness, runny nose, cough, chest pain, fever)     Nausea- new-started yesterday, patient is afraid she is going to vomit and not be able breath.,Patient pressure in the throat is new- "feels like knot in throat" - all new- nausea is coming in waves- never really goes away- has "after feeling"  Protocols used: Breathing Difficulty-A-AH

## 2023-11-25 NOTE — Telephone Encounter (Signed)
 I do not have the results of the CT scan.  I advised her it would be a week to 10 days before it is read.  If she does have excessive saliva or unable to swallow at all, would recommend ED evaluation.  I would recommend she get started back on the omeprazole 20 mg and take this twice daily.  We have already placed referral to GI for further evaluation.

## 2023-11-25 NOTE — Telephone Encounter (Signed)
 Patient notified of all instructions per Regina,NP note. Verbal understanding

## 2023-11-26 ENCOUNTER — Ambulatory Visit
Admission: RE | Admit: 2023-11-26 | Discharge: 2023-11-26 | Disposition: A | Discharge status: Home / Self Care | Source: Ambulatory Visit | Attending: Internal Medicine | Admitting: Internal Medicine

## 2023-11-26 ENCOUNTER — Ambulatory Visit
Admission: RE | Admit: 2023-11-26 | Discharge: 2023-11-26 | Disposition: A | Discharge status: Home / Self Care | Attending: Internal Medicine | Admitting: Internal Medicine

## 2023-11-26 ENCOUNTER — Encounter: Payer: Self-pay | Admitting: Internal Medicine

## 2023-11-26 DIAGNOSIS — R0781 Pleurodynia: Secondary | ICD-10-CM | POA: Insufficient documentation

## 2023-12-06 ENCOUNTER — Encounter: Payer: Self-pay | Admitting: Internal Medicine

## 2023-12-19 ENCOUNTER — Telehealth: Admitting: Family Medicine

## 2023-12-19 DIAGNOSIS — R6889 Other general symptoms and signs: Secondary | ICD-10-CM

## 2023-12-19 MED ORDER — OSELTAMIVIR PHOSPHATE 75 MG PO CAPS: 75.0000 mg | ORAL_CAPSULE | Freq: Two times a day (BID) | ORAL | 0 refills | Status: AC

## 2023-12-19 NOTE — Progress Notes (Signed)
 E visit for Flu like symptoms   We are sorry that you are not feeling well.  Here is how we plan to help! Based on what you have shared with me it looks like you may have a respiratory virus that may be influenza.  Influenza or "the flu" is   an infection caused by a respiratory virus. The flu virus is highly contagious and persons who did not receive their yearly flu vaccination may "catch" the flu from close contact.  We have anti-viral medications to treat the viruses that cause this infection. They are not a "cure" and only shorten the course of the infection. These prescriptions are most effective when they are given within the first 2 days of "flu" symptoms. Antiviral medication are indicated if you have a high risk of complications from the flu. You should  also consider an antiviral medication if you are in close contact with someone who is at risk. These medications can help patients avoid complications from the flu  but have side effects that you should know. Possible side effects from Tamiflu or oseltamivir include nausea, vomiting, diarrhea, dizziness, headaches, eye redness, sleep problems or other respiratory symptoms. You should not take Tamiflu if you have an allergy to oseltamivir or any to the ingredients in Tamiflu.  Based upon your symptoms and potential risk factors I have prescribed Oseltamivir (Tamiflu).  It has been sent to your designated pharmacy.  You will take one 75 mg capsule orally twice a day for the next 5 days. and I recommend that you follow the flu symptoms recommendation that I have listed below.  ANYONE WHO HAS FLU SYMPTOMS SHOULD: Stay home. The flu is highly contagious and going out or to work exposes others! Be sure to drink plenty of fluids. Water is fine as well as fruit juices, sodas and electrolyte beverages. You may want to stay away from caffeine or alcohol. If you are nauseated, try taking small sips of liquids. How do you know if you are getting enough  fluid? Your urine should be a pale yellow or almost colorless. Get rest. Taking a steamy shower or using a humidifier may help nasal congestion and ease sore throat pain. Using a saline nasal spray works much the same way. Cough drops, hard candies and sore throat lozenges may ease your cough. Line up a caregiver. Have someone check on you regularly.   GET HELP RIGHT AWAY IF: You cannot keep down liquids or your medications. You become short of breath Your fell like you are going to pass out or loose consciousness. Your symptoms persist after you have completed your treatment plan MAKE SURE YOU  Understand these instructions. Will watch your condition. Will get help right away if you are not doing well or get worse.  Your e-visit answers were reviewed by a board certified advanced clinical practitioner to complete your personal care plan.  Depending on the condition, your plan could have included both over the counter or prescription medications.  If there is a problem please reply  once you have received a response from your provider.  Your safety is important to [place].  If you have drug allergies check your prescription carefully.    You can use MyChart to ask questions about today's visit, request a non-urgent call back, or ask for a work or school excuse for 24 hours related to this e-Visit. If it has been greater than 24 hours you will need to follow up with your provider, or enter a  new e-Visit to address those concerns.  You will get an e-mail in the next two days asking about your experience.  I hope that your e-visit has been valuable and will speed your recovery. Thank you for using e-visits.  I provided 5 minutes of non face-to-face time during this encounter for chart review, medication and order placement, as well as and documentation.

## 2024-01-02 ENCOUNTER — Ambulatory Visit
Admission: EM | Admit: 2024-01-02 | Discharge: 2024-01-02 | Disposition: A | Discharge status: Home / Self Care | Attending: Family Medicine | Admitting: Family Medicine

## 2024-01-02 DIAGNOSIS — K529 Noninfective gastroenteritis and colitis, unspecified: Secondary | ICD-10-CM

## 2024-01-02 DIAGNOSIS — R1013 Epigastric pain: Secondary | ICD-10-CM | POA: Diagnosis not present

## 2024-01-02 DIAGNOSIS — R519 Headache, unspecified: Secondary | ICD-10-CM | POA: Diagnosis not present

## 2024-01-02 MED ORDER — ONDANSETRON 8 MG PO TBDP
8.0000 mg | ORAL_TABLET | Freq: Once | ORAL | Status: AC
  Administered 2024-01-02: 8 mg via ORAL

## 2024-01-02 MED ORDER — KETOROLAC TROMETHAMINE 10 MG PO TABS: 10.0000 mg | ORAL_TABLET | Freq: Four times a day (QID) | ORAL | 0 refills | Status: DC | PRN

## 2024-01-02 MED ORDER — ONDANSETRON 4 MG PO TBDP: 4.0000 mg | ORAL_TABLET | Freq: Three times a day (TID) | ORAL | 0 refills | Status: AC | PRN

## 2024-01-02 MED ORDER — KETOROLAC TROMETHAMINE 30 MG/ML IJ SOLN
30.0000 mg | Freq: Once | INTRAMUSCULAR | Status: AC
  Administered 2024-01-02: 30 mg via INTRAMUSCULAR

## 2024-01-02 NOTE — ED Triage Notes (Signed)
 Chief Complaint: headache, abdominal pain, loss of appetite. Has barely ate since Monday.  States pain is at the back of the neck and head ongoing since Tuesday. Having nausea with dry heaving associated with the pain. States hands sweating continuously. No history of migraines.   Sick exposure: No  Onset: This past Monday  Prescriptions or OTC medications tried: Yes- Ibuprofen     with no relief  New foods, medications, or products: No  Recent Travel: No

## 2024-01-02 NOTE — Discharge Instructions (Signed)
 You have been given a shot of Toradol 30 mg today.  Ketorolac 10 mg tablets--take 1 tablet every 6 hours as needed for pain.  This is the same medicine that is in the shot we just gave you  Ondansetron dissolved in the mouth every 8 hours as needed for nausea or vomiting. Clear liquids(water, gatorade/pedialyte, ginger ale/sprite, chicken broth/soup) and bland things(crackers/toast, rice, potato, bananas) to eat. Avoid acidic foods like lemon/lime/orange/tomato, and avoid greasy/spicy foods.  We have given you 1 dose of the ondansetron here in the clinic also.  If you worsen in any way, or if the treatments provided do not give you any relief, please consider going to the emergency room for further evaluation

## 2024-01-02 NOTE — ED Provider Notes (Signed)
 [facility]    CSN: [id number] Arrival date & time: 01/02/24  1935      History   Chief Complaint Chief Complaint  Patient presents with   Migraine   Nausea    HPI Sarah Edwards is a 33 y.o. female.    Migraine   Here for nausea and vomiting and epigastric abdominal pain and headache.  Symptoms began on April 14.  She has a ball of pain in her epigastric area.  She is thrown up numerous times, but for the most part lately it is just saliva.  She does not see any bilious material.  She has not been able to eat since April 14.  She has chronic diarrhea and did have her last bowel movement today.  No blood in any output.  The bowel movement today was small.  No fever or chills.  No cough or congestion  She has an IUD and so has not had a period in a while.  She is allergic to azithromycin, amoxicillin and penicillins, and sulfa. 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 Authorizing Provider  Cetirizine HCl (ZYRTEC PO) Take by mouth.   Yes [provider]  ketorolac (TORADOL) 10 MG tablet Take 1 tablet (10 mg total) by mouth every 6 (six) hours as needed (pain). 01/02/24  Yes Ardath Lepak, Janace Aris, MD  Multiple Vitamins-Minerals (WOMENS MULTI PO) Take by mouth. Ritual essentials.   Yes [provider]  ondansetron (ZOFRAN-ODT) 4 MG disintegrating tablet Take 1 tablet (4 mg total) by mouth every 8 (eight) hours as needed for nausea or vomiting. 01/02/24  Yes Zenia Resides, MD  levonorgestrel (MIRENA) 20 MCG/24HR IUD 1 each by Intrauterine route once.   Yes [provider]  OVER THE COUNTER MEDICATION Mushroom vitamin   Yes [provider]  OVER THE COUNTER MEDICATION Beef organ   Yes [provider]  VITAMIN D PO Take by mouth.   Yes [provider]    Family History Family History  Problem Relation Age of Onset   Diabetes Mother    Hypertension Mother    Diabetes Father    Heart disease Father    Hypertension Father    Heart attack Father    Coronary artery disease Father    Breast cancer Maternal Grandmother    Leukemia Maternal Grandfather    Cancer Maternal Grandfather    Breast cancer Paternal Grandmother    Ovarian cancer Paternal Grandmother    Cervical cancer Paternal Grandmother    Lung cancer Paternal Grandfather    Cancer Paternal Aunt    Colon cancer Neg Hx     Social History Social History   Tobacco Use   Smoking status: Former    Current packs/day: 0.00    Average packs/day: 0.5 packs/day for 3.0 years (1.5 ttl pk-yrs)    Types: Cigarettes    Start date: 03/17/2012    Quit date: 03/18/2015    Years since quitting: 8.8   Smokeless tobacco: Never  Vaping Use   Vaping status: Former   Substances: THC   Devices: THC Oil  Substance Use Topics   Alcohol use:  Not Currently   Drug use: Yes    Types: Marijuana    Comment: oil     Allergies   Azithromycin, Amoxicillin, Penicillins, and Sulfa antibiotics   Review of Systems Review of Systems   Physical Exam Triage Vital Signs ED Triage Vitals  Encounter Vitals Group     BP 01/02/24 1955 (!) 141/84     Systolic BP Percentile --      Diastolic BP Percentile --      Pulse Rate 01/02/24 1955 88     Resp 01/02/24 1955 18     Temp 01/02/24 1955 98.4 F (36.9 C)     Temp Source 01/02/24 1955 Oral     SpO2 01/02/24 1955 95 %     Weight --      Height --      Head Circumference --      Peak Flow --      Pain Score 01/02/24 1953 7     Pain Loc --      Pain Education --      Exclude from Growth Chart --    No data found.  Updated Vital Signs BP (!) 141/84 (BP Location: Right Arm)   Pulse 88   Temp 98.4 F (36.9 C) (Oral)   Resp 18   LMP  (LMP Unknown)   SpO2 95%   Visual Acuity Right Eye Distance:   Left Eye Distance:   Bilateral Distance:    Right Eye Near:   Left Eye Near:    Bilateral Near:     Physical Exam Vitals reviewed.  Constitutional:      General: She is not in acute distress.    Appearance: She is not toxic-appearing.  HENT:     Mouth/Throat:     Mouth: Mucous membranes are moist.     Pharynx: No oropharyngeal exudate or posterior oropharyngeal erythema.  Eyes:     Extraocular Movements: Extraocular movements intact.     Conjunctiva/sclera: Conjunctivae normal.     Pupils: Pupils are equal, round, and reactive to light.  Cardiovascular:     Rate and Rhythm: Normal rate and regular rhythm.     Heart sounds: No murmur heard. Pulmonary:     Effort: Pulmonary effort is normal. No respiratory distress.     Breath sounds: No stridor. No wheezing, rhonchi or rales.  Abdominal:  General: Bowel sounds are normal. There is no distension.     Palpations: Abdomen is soft.     Tenderness: There is no guarding.     Comments: There is no guarding.  There is  some very mild tenderness in epigastrium  Musculoskeletal:     Cervical back: Neck supple.  Lymphadenopathy:     Cervical: No cervical adenopathy.  Skin:    Capillary Refill: Capillary refill takes less than 2 seconds.     Coloration: Skin is not jaundiced or pale.  Neurological:     General: No focal deficit present.     Mental Status: She is alert and oriented to person, place, and time.  Psychiatric:        Behavior: Behavior normal.      UC Treatments / Results  Labs (all labs ordered are listed, but only abnormal results are displayed) Labs Reviewed - No data to display  EKG   Radiology No results found.  Procedures Procedures (including critical care time)  Medications Ordered in UC Medications  ketorolac (TORADOL) 30 MG/ML injection 30 mg (has no administration in time range)  ondansetron (ZOFRAN-ODT) disintegrating tablet 8 mg (has no administration in time range)    Initial Impression / Assessment and Plan / UC Course  I have reviewed the triage vital signs and the nursing notes.  Pertinent labs & imaging results that were available during my care of the patient were reviewed by me and considered in my medical decision making (see chart for details).     Zofran and Toradol are given here.  I have sent more ketorolac to the pharmacy for pain and Zofran for nausea.  If she does not get relief from the treatments provided or if she worsens anyway, I have asked her to go to the emergency room. Final Clinical Impressions(s) / UC Diagnoses   Final diagnoses:  Acute intractable headache, unspecified headache type  Gastroenteritis  Epigastric pain     Discharge Instructions      You have been given a shot of Toradol 30 mg today.  Ketorolac 10 mg tablets--take 1 tablet every 6 hours as needed for pain.  This is the same medicine that is in the shot we just gave you  Ondansetron dissolved in the mouth every 8 hours as needed for nausea or vomiting. Clear  liquids(water, gatorade/pedialyte, ginger ale/sprite, chicken broth/soup) and bland things(crackers/toast, rice, potato, bananas) to eat. Avoid acidic foods like lemon/lime/orange/tomato, and avoid greasy/spicy foods.  We have given you 1 dose of the ondansetron here in the clinic also.  If you worsen in any way, or if the treatments provided do not give you any relief, please consider going to the emergency room for further evaluation    ED Prescriptions     Medication Sig Dispense Auth. Provider   ondansetron (ZOFRAN-ODT) 4 MG disintegrating tablet Take 1 tablet (4 mg total) by mouth every 8 (eight) hours as needed for nausea or vomiting. 10 tablet Zenia Resides, MD   ketorolac (TORADOL) 10 MG tablet Take 1 tablet (10 mg total) by mouth every 6 (six) hours as needed (pain). 20 tablet Brianda Beitler, Janace Aris, MD      PDMP not reviewed this encounter.   Zenia Resides, MD 01/02/24 2010

## 2024-03-11 NOTE — Progress Notes (Unsigned)
 PCP:  Antonette Angeline ORN, NP   No chief complaint on file.    HPI:      Ms. Sarah Edwards is a 33 y.o. [id number] whose LMP was No LMP recorded. (Menstrual status: IUD)., presents today for her NP > 3 yrs annual examination.  Her menses are {norm/abn:715}, lasting {number: 22536} days.  Dysmenorrhea {dysmen:716}. She {does:18564} have intermenstrual bleeding.  Sex activity: single partner, contraception - IUD. Mirena  placed 06/28/17 Last Pap: 08/24/21 Results were: no abnormalities  Hx of STDs: {STD hx:14358}  There is no FH of breast cancer. There is no FH of ovarian cancer. The patient {does:18564} do self-breast exams.  Tobacco use: {tob:20664} Alcohol use: {Alcohol:11675} No drug use.  Exercise: {exercise:31265}  She {does:18564} get adequate calcium  and Vitamin D  in her diet.  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   Acid reflux 01/12/2016   Anxiety 01/07/2015    Past Surgical History:  Procedure Laterality Date   ANKLE RECONSTRUCTION Right    COLONOSCOPY N/A 2010 and 2011   crohns   ESOPHAGOGASTRODUODENOSCOPY ENDOSCOPY N/A 2010   laproscopy  2012    Family History  Problem Relation Age of Onset   Diabetes Mother    Hypertension Mother    Diabetes Father    Heart disease Father    Hypertension Father    Heart attack Father    Coronary artery disease Father    Breast cancer Maternal Grandmother    Leukemia Maternal Grandfather    Cancer Maternal Grandfather    Breast cancer Paternal Grandmother    Ovarian cancer Paternal Grandmother    Cervical cancer Paternal Grandmother    Lung cancer Paternal Grandfather    Cancer Paternal Aunt    Colon cancer Neg Hx     Social History   Socioeconomic History   Marital status:  Married    Spouse name: Not on file   Number of children: 2   Years of education: Not on file   Highest education level: Not on file  Occupational History    Comment: Investment banker, corporate - [employer]  Tobacco Use   Smoking status: Former    Current packs/day: 0.00    Average packs/day: 0.5 packs/day for 3.0 years (1.5 ttl pk-yrs)    Types: Cigarettes    Start date: 03/17/2012    Quit date: 03/18/2015    Years since quitting: 8.9   Smokeless tobacco: Never  Vaping Use   Vaping status: Former   Substances: THC   Devices: THC Oil  Substance and Sexual Activity   Alcohol use: Not Currently   Drug use: Yes    Types: Marijuana    Comment: oil   Sexual activity: Yes    Birth control/protection: I.U.D.  Other Topics Concern   Not on file  Social History Narrative   Lives in [place] with husband and dtr.  Does not routinely exercise.   Social Drivers of Corporate investment banker Strain: Not on file  Food Insecurity: Not on file  Transportation Needs: Not on file  Physical Activity: Not on file  Stress: Not on file  Social Connections: Not on file  Intimate Partner Violence: Not on file     Current Outpatient Medications:    Cetirizine HCl (ZYRTEC PO), Take by  mouth., Disp: , Rfl:    ketorolac  (TORADOL ) 10 MG tablet, Take 1 tablet (10 mg total) by mouth every 6 (six) hours as needed (pain)., Disp: 20 tablet, Rfl: 0   levonorgestrel  (MIRENA ) 20 MCG/24HR IUD, 1 each by Intrauterine route once., Disp: , Rfl:    Multiple Vitamins-Minerals (WOMENS MULTI PO), Take by mouth. Ritual essentials., Disp: , Rfl:    ondansetron  (ZOFRAN -ODT) 4 MG disintegrating tablet, Take 1 tablet (4 mg total) by mouth every 8 (eight) hours as needed for nausea or vomiting., Disp: 10 tablet, Rfl: 0   OVER THE COUNTER MEDICATION, Mushroom vitamin, Disp: , Rfl:    OVER THE COUNTER MEDICATION, Beef organ, Disp: , Rfl:    VITAMIN D  PO, Take by mouth., Disp: , Rfl:      ROS:  Review of Systems BREAST:  No symptoms   Objective: There were no vitals taken for this visit.   OBGyn Exam  Results: No results found for this or any previous visit (from the past 24 hours).  Assessment/Plan: No diagnosis found.  No orders of the defined types were placed in this encounter.            GYN counsel {counseling: 16159}     F/U  No follow-ups on file.  Teasha Murrillo B. Shelia Kingsberry, PA-C 03/11/2024 5:33 PM

## 2024-03-12 ENCOUNTER — Telehealth: Payer: Self-pay | Admitting: Obstetrics and Gynecology

## 2024-03-12 ENCOUNTER — Ambulatory Visit: Admitting: Obstetrics and Gynecology

## 2024-03-12 ENCOUNTER — Other Ambulatory Visit (HOSPITAL_COMMUNITY)
Admission: RE | Admit: 2024-03-12 | Discharge: 2024-03-12 | Disposition: A | Discharge status: Home / Self Care | Source: Ambulatory Visit | Attending: Obstetrics and Gynecology | Admitting: Obstetrics and Gynecology

## 2024-03-12 ENCOUNTER — Encounter: Payer: Self-pay | Admitting: Obstetrics and Gynecology

## 2024-03-12 VITALS — BP 114/69 | HR 71 | Ht 65.0 in | Wt 158.0 lb

## 2024-03-12 DIAGNOSIS — Z30431 Encounter for routine checking of intrauterine contraceptive device: Secondary | ICD-10-CM

## 2024-03-12 DIAGNOSIS — Z1272 Encounter for screening for malignant neoplasm of vagina: Secondary | ICD-10-CM

## 2024-03-12 DIAGNOSIS — Z124 Encounter for screening for malignant neoplasm of cervix: Secondary | ICD-10-CM | POA: Diagnosis present

## 2024-03-12 DIAGNOSIS — Z01419 Encounter for gynecological examination (general) (routine) without abnormal findings: Secondary | ICD-10-CM | POA: Diagnosis not present

## 2024-03-12 DIAGNOSIS — F32A Depression, unspecified: Secondary | ICD-10-CM

## 2024-03-12 DIAGNOSIS — Z1151 Encounter for screening for human papillomavirus (HPV): Secondary | ICD-10-CM | POA: Diagnosis present

## 2024-03-12 DIAGNOSIS — N898 Other specified noninflammatory disorders of vagina: Secondary | ICD-10-CM

## 2024-03-12 DIAGNOSIS — F419 Anxiety disorder, unspecified: Secondary | ICD-10-CM

## 2024-03-12 DIAGNOSIS — Z803 Family history of malignant neoplasm of breast: Secondary | ICD-10-CM | POA: Insufficient documentation

## 2024-03-12 MED ORDER — ESCITALOPRAM OXALATE 10 MG PO TABS: ORAL_TABLET | ORAL | 1 refills | Status: DC

## 2024-03-12 NOTE — Patient Instructions (Signed)
 I value your feedback and you entrusting [place] with your care. If you get a [facility] [place] patient survey, I would appreciate you taking the time to let us know about your experience today. Thank you! ? ? ?

## 2024-03-12 NOTE — Telephone Encounter (Signed)
 Reached out to pt to schedule f/u appts-anxiety/depression f/u in 7 weeks with ABC, appt with Roby for vaginal tissue exc consult.  Left message for pt to call back to schedule f/u appts.

## 2024-03-13 LAB — CYTOLOGY - PAP
Comment: NEGATIVE
Diagnosis: NEGATIVE
High risk HPV: NEGATIVE

## 2024-03-17 DIAGNOSIS — Z1371 Encounter for nonprocreative screening for genetic disease carrier status: Secondary | ICD-10-CM

## 2024-03-17 DIAGNOSIS — Z803 Family history of malignant neoplasm of breast: Secondary | ICD-10-CM

## 2024-03-17 HISTORY — DX: Family history of malignant neoplasm of breast: Z80.3

## 2024-03-17 HISTORY — DX: Encounter for nonprocreative screening for genetic disease carrier status: Z13.71

## 2024-03-18 NOTE — Telephone Encounter (Signed)
 Pt has been scheduled with Dr. Leigh on 04/06/2024 at 2:35.  Pt has been scheduled with Bernarda on 04/27/2024 at 4:15.

## 2024-03-26 ENCOUNTER — Encounter: Payer: Self-pay | Admitting: Obstetrics and Gynecology

## 2024-03-30 ENCOUNTER — Ambulatory Visit: Payer: Self-pay | Admitting: Internal Medicine

## 2024-04-01 NOTE — Progress Notes (Unsigned)
    GYNECOLOGY PROGRESS NOTE  Subjective:  PCP: Antonette Angeline ORN, NP  Patient ID: Sarah Edwards, female    DOB: 1991/06/02, 33 y.o.   MRN: [id number]  HPI  Patient is a 33 y.o. G31P2012 female who presents as referral from [facility] for a consult regarding some vaginal tissue and getting excision. Pt saw Bernarda on 03/12/24 and brought this concern to her during IUD removal. Pt states in 2018, had a traumatic SVD requiring manual extraction of placenta and felt her vaginal repair didn't heal correctly. She has a skin-tag-like piece of tissue at the base of her vaginal canal that protudes, interferes with intercourse, bleeds with intercourse, and is painful. She desires excision.  The following portions of the patient's history were reviewed and updated as appropriate: allergies, current medications, past family history, past medical history, past social history, past surgical history, and problem list.  Review of Systems Pertinent items are noted in HPI.   Objective:   Blood pressure 120/80, height 5' 5 (1.651 m), weight 154 lb (69.9 kg), last menstrual period 04/04/2024. Body mass index is 25.63 kg/m.  Physical Exam Vitals and nursing note reviewed. Exam conducted with a chaperone present.  Constitutional:      Appearance: Normal appearance.  HENT:     Head: Normocephalic and atraumatic.  Eyes:     Extraocular Movements: Extraocular movements intact.  Pulmonary:     Effort: Pulmonary effort is normal.  Genitourinary:    General: Normal vulva.      Comments: Vaginal mucosa protruding from hymenal ring outward, approx at 6:00; tender to palpation; not friable on exam Musculoskeletal:        General: Normal range of motion.  Neurological:     General: No focal deficit present.     Mental Status: She is alert.    Assessment/Plan:   No diagnosis found.  33 y.o. [id number] with excess vaginal tissue causing symptoms of pain, dyspareunia, and vaginal bleeding, desiring  removal. Reviewed R/B/A of the procedure and answered questions. Patient desires to proceed.  -Requesting 05/25/24 as procedure date -Pre-operative instructions reviewed -Plan for 2wk post-op follow up.   Sarah Mangle, DO [facility]

## 2024-04-06 ENCOUNTER — Ambulatory Visit: Admitting: Obstetrics

## 2024-04-06 VITALS — BP 120/80 | Ht 65.0 in | Wt 154.0 lb

## 2024-04-06 DIAGNOSIS — R102 Pelvic and perineal pain: Secondary | ICD-10-CM | POA: Diagnosis not present

## 2024-04-06 DIAGNOSIS — N939 Abnormal uterine and vaginal bleeding, unspecified: Secondary | ICD-10-CM

## 2024-04-06 DIAGNOSIS — N9411 Superficial (introital) dyspareunia: Secondary | ICD-10-CM | POA: Diagnosis not present

## 2024-04-06 DIAGNOSIS — N898 Other specified noninflammatory disorders of vagina: Secondary | ICD-10-CM

## 2024-04-07 ENCOUNTER — Ambulatory Visit: Admitting: Internal Medicine

## 2024-04-07 ENCOUNTER — Encounter: Payer: Self-pay | Admitting: Obstetrics

## 2024-04-07 ENCOUNTER — Telehealth: Payer: Self-pay | Admitting: Obstetrics and Gynecology

## 2024-04-07 DIAGNOSIS — N898 Other specified noninflammatory disorders of vagina: Secondary | ICD-10-CM | POA: Insufficient documentation

## 2024-04-07 NOTE — Telephone Encounter (Signed)
 Pt aware of neg MyRisk results except POLE VUS. IBIS=18.4%/riskscore=17.9%. No increased screening options.   Patient understands these results only apply to her and her children, and this is not indicative of genetic testing results of her other family members. It is recommended that her other family members have genetic testing done.  Pt also understands negative genetic testing doesn't mean she will never get any of these cancers.   Hard copy mailed to pt. F/u prn.

## 2024-04-27 ENCOUNTER — Ambulatory Visit: Admitting: Obstetrics and Gynecology

## 2024-05-04 NOTE — Progress Notes (Deleted)
 Antonette Angeline ORN, NP   No chief complaint on file.   HPI:      Sarah Edwards is a 33 y.o. [id number] whose LMP was Patient's last menstrual period was 04/04/2024., presents today for *** Started lexapro   03/12/24 NOTE: Has had long hx of anxiety but now with depression sx as well since quit her job 11/24. Has mood changes/irritability/no motivation/feels overwhelmed/can't concentrate to do tasks. Can't turn brain off so has trouble sleeping. No panic attacks. Not seeing counselor. No SI.Treated with zoloft in past for anxiety that made her mean so she stopped it. No other med tx.   Patient Active Problem List   Diagnosis Date Noted   Skin tag of vaginal mucosa 04/07/2024   Family history of breast cancer 03/12/2024   Overweight with body mass index (BMI) of 26 to 26.9 in adult 11/19/2023   Crohn's disease without complication, unspecified gastrointestinal tract location (HCC) 11/23/2021   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   Acid reflux 01/12/2016   Anxiety and depression 01/07/2015    Past Surgical History:  Procedure Laterality Date   ANKLE RECONSTRUCTION Right    COLONOSCOPY N/A 2010 and 2011   crohns   ESOPHAGOGASTRODUODENOSCOPY ENDOSCOPY N/A 2010   laproscopy  2012    Family History  Problem Relation Age of Onset   Diabetes Mother    Hypertension Mother    Diabetes Father    Heart disease Father    Hypertension Father    Heart attack Father    Coronary artery disease Father    Healthy Brother    Ovarian cancer Maternal Aunt        unknown age   Pancreatic cancer Maternal Uncle        unknown age   Breast cancer Maternal Grandmother        55s   Leukemia Maternal Grandfather 61   Breast cancer Paternal Grandmother        30s   Ovarian cancer Paternal Grandmother    Cervical cancer Paternal Grandmother    Lung cancer Paternal Grandfather    Colon cancer Neg Hx     Social History   Socioeconomic History   Marital status: Married     Spouse name: Not on file   Number of children: 2   Years of education: Not on file   Highest education level: Not on file  Occupational History    Comment: Investment banker, corporate - [employer]  Tobacco Use   Smoking status: Former    Current packs/day: 0.00    Average packs/day: 0.5 packs/day for 3.0 years (1.5 ttl pk-yrs)    Types: Cigarettes    Start date: 03/17/2012    Quit date: 03/18/2015    Years since quitting: 9.1   Smokeless tobacco: Never  Vaping Use   Vaping status: Former   Substances: THC   Devices: THC Oil  Substance and Sexual Activity   Alcohol use: Not Currently   Drug use: Yes    Types: Marijuana    Comment: oil   Sexual activity: Yes    Birth control/protection: I.U.D.    Comment: Mirena   Other Topics Concern   Not on file  Social History Narrative   Lives in [place] with husband and dtr.  Does not routinely exercise.   Social Drivers of Corporate investment banker Strain: Low Risk  (04/02/2024)   Received from [facility]   Overall Financial Resource Strain (CARDIA)    Difficulty  of Paying Living Expenses: Not hard at all  Food Insecurity: No Food Insecurity (04/02/2024)   Received from [facility]   Hunger Vital Sign    Within the past 12 months, you worried that your food would run out before you got the money to buy more.: Never true    Within the past 12 months, the food you bought just didn't last and you didn't have money to get more.: Never true  Transportation Needs: No Transportation Needs (04/02/2024)   Received [facility] - Transportation    In the past 12 months, has lack of transportation kept you from medical appointments or from getting medications?: No    Lack of Transportation (Non-Medical): No  Physical Activity: Not on file  Stress: Not on file  Social Connections: Not on file  Intimate Partner Violence: Not on file    Outpatient Medications Prior to Visit   Medication Sig Dispense Refill   Cetirizine HCl (ZYRTEC PO) Take by mouth.     dicyclomine  (BENTYL ) 10 MG capsule Take 10 mg by mouth.     escitalopram  (LEXAPRO ) 10 MG tablet Take 1/2 tab daily for 6 days then 1 tab daily 30 tablet 1   levonorgestrel  (MIRENA ) 20 MCG/24HR IUD 1 each by Intrauterine route once.     Multiple Vitamins-Minerals (WOMENS MULTI PO) Take by mouth. Ritual essentials.     ondansetron  (ZOFRAN -ODT) 4 MG disintegrating tablet Take 1 tablet (4 mg total) by mouth every 8 (eight) hours as needed for nausea or vomiting. 10 tablet 0   OVER THE COUNTER MEDICATION Mushroom vitamin     OVER THE COUNTER MEDICATION Beef organ     SODIUM FLUORIDE, DENTAL RINSE, 0.2 % SOLN Take by mouth.     VITAMIN D  PO Take by mouth.     No facility-administered medications prior to visit.      ROS:  Review of Systems BREAST: No symptoms   OBJECTIVE:   Vitals:  LMP 04/04/2024   Physical Exam  Results: No results found for this or any previous visit (from the past 24 hours).   Assessment/Plan: No diagnosis found.    No orders of the defined types were placed in this encounter.     No follow-ups on file.  Fed Ceci B. Alik Mawson, PA-C 05/04/2024 5:35 PM

## 2024-05-05 ENCOUNTER — Ambulatory Visit: Admitting: Obstetrics and Gynecology

## 2024-05-18 ENCOUNTER — Inpatient Hospital Stay: Admission: RE | Admit: 2024-05-18 | Source: Ambulatory Visit

## 2024-05-19 ENCOUNTER — Telehealth: Payer: Self-pay

## 2024-05-19 ENCOUNTER — Other Ambulatory Visit: Payer: Self-pay | Admitting: Obstetrics and Gynecology

## 2024-05-19 ENCOUNTER — Encounter
Admission: RE | Admit: 2024-05-19 | Discharge: 2024-05-19 | Disposition: A | Discharge status: Home / Self Care | Source: Ambulatory Visit | Attending: Obstetrics | Admitting: Obstetrics

## 2024-05-19 ENCOUNTER — Other Ambulatory Visit: Payer: Self-pay

## 2024-05-19 VITALS — Ht 65.0 in | Wt 160.0 lb

## 2024-05-19 DIAGNOSIS — F32A Depression, unspecified: Secondary | ICD-10-CM

## 2024-05-19 DIAGNOSIS — Z01812 Encounter for preprocedural laboratory examination: Secondary | ICD-10-CM

## 2024-05-19 HISTORY — DX: Unspecified asthma, uncomplicated: J45.909

## 2024-05-19 MED ORDER — ESCITALOPRAM OXALATE 10 MG PO TABS
10.0000 mg | ORAL_TABLET | Freq: Every day | ORAL | 0 refills | Status: DC
Start: 2024-05-19 — End: 2024-06-02

## 2024-05-19 NOTE — Patient Instructions (Addendum)
 Your procedure is scheduled on: Monday 05/25/24 Report to the Registration Desk on the 1st floor of the [facility]. To find out your arrival time, please call [phone] between 1PM - 3PM on: Friday 05/22/24 If your arrival time is 6:00 am, do not arrive before that time as the Medical Mall entrance doors do not open until 6:00 am.  REMEMBER: Instructions that are not followed completely may result in serious medical risk, up to and including death; or upon the discretion of your surgeon and anesthesiologist your surgery may need to be rescheduled.  Do not eat food or drink fluids after midnight the night before surgery.  No gum chewing or hard candies.   One week prior to surgery: Stop Anti-inflammatories (NSAIDS) such as Advil , Aleve , Ibuprofen , Motrin , Naproxen , Naprosyn  and Aspirin based products such as Excedrin, Goody's Powder, BC Powder. Stop ANY OVER THE COUNTER supplements until after surgery. VITAMIN D   Beef organ  Mushroom vitamin  Multiple Vitamins-Minerals (WOMENS MULTI )  You may however, continue to take Tylenol  if needed for pain up until the day of surgery.  Continue taking all of your other prescription medications up until the day of surgery.  ON THE DAY OF SURGERY ONLY TAKE THESE MEDICATIONS WITH SIPS OF WATER:  escitalopram  (LEXAPRO ) 10 MG  No Alcohol for 24 hours before or after surgery.  No Smoking including e-cigarettes for 24 hours before surgery.  No chewable tobacco products for at least 6 hours before surgery.  No nicotine  patches on the day of surgery.  Do not use any recreational drugs for at least a week (preferably 2 weeks) before your surgery.  Please be advised that the combination of cocaine and anesthesia may have negative outcomes, up to and including death. If you test positive for cocaine, your surgery will be cancelled.  On the morning of surgery brush your teeth with toothpaste and water, you may rinse your mouth with mouthwash if you  wish. Do not swallow any toothpaste or mouthwash.  Use CHG Soap or wipes as directed on instruction sheet.  Do not wear jewelry, make-up, hairpins, clips or nail polish.  For welded (permanent) jewelry: bracelets, anklets, waist bands, etc.  Please have this removed prior to surgery.  If it is not removed, there is a chance that hospital personnel will need to cut it off on the day of surgery.  Do not wear lotions, powders, or perfumes.   Do not shave body hair from the neck down 48 hours before surgery.  Contact lenses, hearing aids and dentures may not be worn into surgery.  Do not bring valuables to the hospital. [facility] is not responsible for any missing/lost belongings or valuables.   Notify your doctor if there is any change in your medical condition (cold, fever, infection).  Wear comfortable clothing (specific to your surgery type) to the hospital.  After surgery, you can help prevent lung complications by doing breathing exercises.  Take deep breaths and cough every 1-2 hours. Your doctor may order a device called an Incentive Spirometer to help you take deep breaths. When coughing or sneezing, hold a pillow firmly against your incision with both hands. This is called "splinting." Doing this helps protect your incision. It also decreases belly discomfort.  If you are being admitted to the hospital overnight, leave your suitcase in the car. After surgery it may be brought to your room.  In case of increased patient census, it may be necessary for you, the patient, to continue  your postoperative care in the Same Day Surgery department.  If you are being discharged the day of surgery, you will not be allowed to drive home. You will need a responsible individual to drive you home and stay with you for 24 hours after surgery.   If you are taking public transportation, you will need to have a responsible individual with you.  Please call the Pre-admissions Testing Dept. at  [phone] if you have any questions about these instructions.  Surgery Visitation Policy:  Patients having surgery or a procedure may have two visitors.  Children under the age of 56 must have an adult with them who is not the patient.  Inpatient Visitation:    Visiting hours are 7 a.m. to 8 p.m. Up to four visitors are allowed at one time in a patient room. The visitors may rotate out with other people during the day.  One visitor age 22 or older may stay with the patient overnight and must be in the room by 8 p.m.   Merchandiser, retail to address health-related social needs:  [email or web address]

## 2024-05-19 NOTE — Telephone Encounter (Signed)
Rx RF eRxd. Thx.  

## 2024-05-19 NOTE — Progress Notes (Signed)
 Rx RF lexapro  till 06/02/24 f/u appt

## 2024-05-22 ENCOUNTER — Encounter: Payer: Self-pay | Admitting: Internal Medicine

## 2024-05-25 ENCOUNTER — Ambulatory Visit
Admission: RE | Admit: 2024-05-25 | Discharge: 2024-05-25 | Disposition: A | Discharge status: Home / Self Care | Attending: Obstetrics | Admitting: Obstetrics

## 2024-05-25 ENCOUNTER — Other Ambulatory Visit: Payer: Self-pay

## 2024-05-25 ENCOUNTER — Encounter: Payer: Self-pay | Admitting: Obstetrics

## 2024-05-25 ENCOUNTER — Encounter
Admission: RE | Disposition: A | Discharge status: Home / Self Care | Payer: Self-pay | Source: Home / Self Care | Attending: Obstetrics

## 2024-05-25 ENCOUNTER — Ambulatory Visit: Admitting: Anesthesiology

## 2024-05-25 ENCOUNTER — Ambulatory Visit: Payer: Self-pay | Admitting: Urgent Care

## 2024-05-25 DIAGNOSIS — Z01812 Encounter for preprocedural laboratory examination: Secondary | ICD-10-CM

## 2024-05-25 DIAGNOSIS — N939 Abnormal uterine and vaginal bleeding, unspecified: Secondary | ICD-10-CM

## 2024-05-25 DIAGNOSIS — N898 Other specified noninflammatory disorders of vagina: Secondary | ICD-10-CM

## 2024-05-25 DIAGNOSIS — N9411 Superficial (introital) dyspareunia: Secondary | ICD-10-CM | POA: Diagnosis not present

## 2024-05-25 DIAGNOSIS — L918 Other hypertrophic disorders of the skin: Secondary | ICD-10-CM | POA: Diagnosis present

## 2024-05-25 DIAGNOSIS — N842 Polyp of vagina: Secondary | ICD-10-CM

## 2024-05-25 DIAGNOSIS — F17290 Nicotine dependence, other tobacco product, uncomplicated: Secondary | ICD-10-CM | POA: Diagnosis not present

## 2024-05-25 DIAGNOSIS — R102 Pelvic and perineal pain: Secondary | ICD-10-CM | POA: Diagnosis not present

## 2024-05-25 HISTORY — PX: EXCISION OF SKIN TAG: SHX6270

## 2024-05-25 LAB — POCT PREGNANCY, URINE: Preg Test, Ur: NEGATIVE

## 2024-05-25 SURGERY — EXCISION, SKIN TAG
Anesthesia: General

## 2024-05-25 MED ORDER — LIDOCAINE HCL (CARDIAC) PF 100 MG/5ML IV SOSY
PREFILLED_SYRINGE | INTRAVENOUS | Status: DC | PRN
  Administered 2024-05-25: 80 mg via INTRAVENOUS

## 2024-05-25 MED ORDER — 0.9 % SODIUM CHLORIDE (POUR BTL) OPTIME
TOPICAL | Status: DC | PRN
  Administered 2024-05-25: 500 mL

## 2024-05-25 MED ORDER — PROPOFOL 10 MG/ML IV BOLUS
INTRAVENOUS | Status: AC
  Filled 2024-05-25: qty 20

## 2024-05-25 MED ORDER — FENTANYL CITRATE (PF) 100 MCG/2ML IJ SOLN: 25.0000 ug | INTRAMUSCULAR | Status: DC | PRN

## 2024-05-25 MED ORDER — DEXAMETHASONE SODIUM PHOSPHATE 10 MG/ML IJ SOLN
INTRAMUSCULAR | Status: DC | PRN
  Administered 2024-05-25: 10 mg via INTRAVENOUS

## 2024-05-25 MED ORDER — PHENYLEPHRINE 80 MCG/ML (10ML) SYRINGE FOR IV PUSH (FOR BLOOD PRESSURE SUPPORT)
PREFILLED_SYRINGE | INTRAVENOUS | Status: AC
Start: 2024-05-25 — End: 2024-05-25
  Filled 2024-05-25: qty 10

## 2024-05-25 MED ORDER — OXYCODONE HCL 5 MG/5ML PO SOLN: 5.0000 mg | Freq: Once | ORAL | Status: AC | PRN

## 2024-05-25 MED ORDER — EPHEDRINE 5 MG/ML INJ
INTRAVENOUS | Status: AC
Start: 2024-05-25 — End: 2024-05-25
  Filled 2024-05-25: qty 5

## 2024-05-25 MED ORDER — MIDAZOLAM HCL 2 MG/2ML IJ SOLN
INTRAMUSCULAR | Status: AC
  Filled 2024-05-25: qty 2

## 2024-05-25 MED ORDER — ACETAMINOPHEN 10 MG/ML IV SOLN: 1000.0000 mg | Freq: Once | INTRAVENOUS | Status: DC | PRN

## 2024-05-25 MED ORDER — ONDANSETRON HCL 4 MG/2ML IJ SOLN
INTRAMUSCULAR | Status: DC | PRN
  Administered 2024-05-25: 4 mg via INTRAVENOUS

## 2024-05-25 MED ORDER — CHLORHEXIDINE GLUCONATE 0.12 % MT SOLN
OROMUCOSAL | Status: AC
  Filled 2024-05-25: qty 15

## 2024-05-25 MED ORDER — CHLORHEXIDINE GLUCONATE 0.12 % MT SOLN
15.0000 mL | Freq: Once | OROMUCOSAL | Status: AC
  Administered 2024-05-25: 15 mL via OROMUCOSAL

## 2024-05-25 MED ORDER — OXYCODONE HCL 5 MG PO TABS
5.0000 mg | ORAL_TABLET | Freq: Once | ORAL | Status: AC | PRN
  Administered 2024-05-25: 5 mg via ORAL

## 2024-05-25 MED ORDER — IBUPROFEN 800 MG PO TABS: 800.0000 mg | ORAL_TABLET | Freq: Three times a day (TID) | ORAL | 0 refills | Status: AC | PRN

## 2024-05-25 MED ORDER — MIDAZOLAM HCL 2 MG/2ML IJ SOLN
INTRAMUSCULAR | Status: DC | PRN
  Administered 2024-05-25: 2 mg via INTRAVENOUS

## 2024-05-25 MED ORDER — ACETAMINOPHEN 500 MG PO TABS
ORAL_TABLET | ORAL | Status: AC
Start: 2024-05-25 — End: 2024-05-25
  Filled 2024-05-25: qty 2

## 2024-05-25 MED ORDER — LIDOCAINE HCL (PF) 2 % IJ SOLN
INTRAMUSCULAR | Status: AC
Start: 2024-05-25 — End: 2024-05-25
  Filled 2024-05-25: qty 5

## 2024-05-25 MED ORDER — LACTATED RINGERS IV SOLN: INTRAVENOUS | Status: DC

## 2024-05-25 MED ORDER — FENTANYL CITRATE (PF) 100 MCG/2ML IJ SOLN
INTRAMUSCULAR | Status: DC | PRN
  Administered 2024-05-25 (×2): 50 ug via INTRAVENOUS

## 2024-05-25 MED ORDER — ONDANSETRON HCL 4 MG/2ML IJ SOLN
INTRAMUSCULAR | Status: AC
  Filled 2024-05-25: qty 2

## 2024-05-25 MED ORDER — PHENYLEPHRINE 80 MCG/ML (10ML) SYRINGE FOR IV PUSH (FOR BLOOD PRESSURE SUPPORT)
PREFILLED_SYRINGE | INTRAVENOUS | Status: DC | PRN
  Administered 2024-05-25: 80 ug via INTRAVENOUS

## 2024-05-25 MED ORDER — LIDOCAINE-EPINEPHRINE 1 %-1:100000 IJ SOLN
INTRAMUSCULAR | Status: AC
Start: 2024-05-25 — End: 2024-05-25
  Filled 2024-05-25: qty 1

## 2024-05-25 MED ORDER — ACETAMINOPHEN 500 MG PO TABS
1000.0000 mg | ORAL_TABLET | ORAL | Status: AC
  Administered 2024-05-25: 1000 mg via ORAL

## 2024-05-25 MED ORDER — DEXAMETHASONE SODIUM PHOSPHATE 10 MG/ML IJ SOLN
INTRAMUSCULAR | Status: AC
Start: 2024-05-25 — End: 2024-05-25
  Filled 2024-05-25: qty 1

## 2024-05-25 MED ORDER — ORAL CARE MOUTH RINSE: 15.0000 mL | Freq: Once | OROMUCOSAL | Status: AC

## 2024-05-25 MED ORDER — EPHEDRINE SULFATE-NACL 50-0.9 MG/10ML-% IV SOSY
PREFILLED_SYRINGE | INTRAVENOUS | Status: DC | PRN
  Administered 2024-05-25: 5 mg via INTRAVENOUS
  Administered 2024-05-25: 10 mg via INTRAVENOUS

## 2024-05-25 MED ORDER — DROPERIDOL 2.5 MG/ML IJ SOLN: 0.6250 mg | Freq: Once | INTRAMUSCULAR | Status: DC | PRN

## 2024-05-25 MED ORDER — OXYCODONE HCL 5 MG PO TABS
ORAL_TABLET | ORAL | Status: AC
  Filled 2024-05-25: qty 1

## 2024-05-25 MED ORDER — OXYCODONE-ACETAMINOPHEN 5-325 MG PO TABS: 1.0000 | ORAL_TABLET | Freq: Three times a day (TID) | ORAL | 0 refills | Status: DC | PRN

## 2024-05-25 MED ORDER — FENTANYL CITRATE (PF) 100 MCG/2ML IJ SOLN
INTRAMUSCULAR | Status: AC
  Filled 2024-05-25: qty 2

## 2024-05-25 MED ORDER — PROPOFOL 10 MG/ML IV BOLUS
INTRAVENOUS | Status: DC | PRN
  Administered 2024-05-25: 200 mg via INTRAVENOUS

## 2024-05-25 MED ORDER — BACITRACIN ZINC 500 UNIT/GM EX OINT
TOPICAL_OINTMENT | CUTANEOUS | Status: AC
Start: 2024-05-25 — End: 2024-05-25
  Filled 2024-05-25: qty 28.35

## 2024-05-25 SURGICAL SUPPLY — 28 items
BLADE SURG 15 STRL LF DISP TIS (BLADE) ×1 IMPLANT
BLADE SURG SZ10 CARB STEEL (BLADE) ×1 IMPLANT
CATH ROBINSON RED A/P 16FR (CATHETERS) ×1 IMPLANT
DISSECTOR SURG LIGASURE 21 (MISCELLANEOUS) IMPLANT
DRAPE PERI LITHO V/GYN (MISCELLANEOUS) ×1 IMPLANT
DRAPE UNDER BUTTOCK W/FLU (DRAPES) ×1 IMPLANT
DRSG TELFA 3X8 NADH STRL (GAUZE/BANDAGES/DRESSINGS) ×1 IMPLANT
ELECT BLADE 6.5 EXT (BLADE) ×1 IMPLANT
ELECT CAUTERY BLADE 6.4 (BLADE) ×1 IMPLANT
ELECTRODE REM PT RTRN 9FT ADLT (ELECTROSURGICAL) ×1 IMPLANT
GAUZE 4X4 16PLY ~~LOC~~+RFID DBL (SPONGE) ×2 IMPLANT
GLOVE BIO SURGEON STRL SZ 6 (GLOVE) ×1 IMPLANT
GLOVE BIOGEL PI IND STRL 6 (GLOVE) ×1 IMPLANT
GOWN SRG LRG LVL 4 IMPRV REINF (GOWNS) ×1 IMPLANT
KIT TURNOVER CYSTO (KITS) ×1 IMPLANT
MANIFOLD NEPTUNE II (INSTRUMENTS) ×1 IMPLANT
NDL HYPO 25X1 1.5 SAFETY (NEEDLE) ×1 IMPLANT
NEEDLE HYPO 25X1 1.5 SAFETY (NEEDLE) IMPLANT
NS IRRIG 500ML POUR BTL (IV SOLUTION) ×1 IMPLANT
PACK BASIN MINOR ARMC (MISCELLANEOUS) ×1 IMPLANT
PAD OB MATERNITY 11 LF (PERSONAL CARE ITEMS) ×1 IMPLANT
PAD PREP OB/GYN DISP 24X41 (PERSONAL CARE ITEMS) ×1 IMPLANT
SCRUB CHG 4% DYNA-HEX 4OZ (MISCELLANEOUS) ×1 IMPLANT
SUT VIC AB 4-0 SH 27XANBCTRL (SUTURE) ×1 IMPLANT
SYR CONTROL 10ML LL (SYRINGE) ×1 IMPLANT
TRAP FLUID SMOKE EVACUATOR (MISCELLANEOUS) ×1 IMPLANT
TUBING SMOKE EVAC 6FT (TUBING) ×1 IMPLANT
WATER STERILE IRR 500ML POUR (IV SOLUTION) ×1 IMPLANT

## 2024-05-25 NOTE — Anesthesia Preprocedure Evaluation (Addendum)
 Anesthesia Evaluation  Patient identified by MRN, date of birth, ID band Patient awake    Reviewed: Allergy & Precautions, H&P , NPO status , Patient's Chart, lab work & pertinent test results  Airway Mallampati: II  TM Distance: >3 FB Neck ROM: full    Dental no notable dental hx.    Pulmonary asthma , Patient abstained from smoking., former smoker   Pulmonary exam normal        Cardiovascular negative cardio ROS Normal cardiovascular exam     Neuro/Psych  PSYCHIATRIC DISORDERS Anxiety     negative neurological ROS     GI/Hepatic Neg liver ROS,,,Crohn's disease   Endo/Other  negative endocrine ROS    Renal/GU      Musculoskeletal   Abdominal Normal abdominal exam  (+)   Peds  Hematology negative hematology ROS (+)   Anesthesia Other Findings Past Medical History: No date: Abnormal uterine bleeding (AUB) No date: Anemia 03/21/2017: Anemia during pregnancy in third trimester No date: Anxiety No date: Asthma 03/2024: BRCA negative     Comment:  MyRisk neg except POLE VUS 08/19/2018: Bright red rectal bleeding No date: Chronic UTI No date: Crohn disease (HCC) No date: Endometriosis 03/2024: Family history of breast cancer     Comment:  IBIS=18.4%/riskscore=17.9% No date: Family history of ovarian cancer No date: IBS (irritable bowel syndrome) No date: Painful menstrual periods 01/06/2014: SAB (spontaneous abortion)  Past Surgical History: No date: ANKLE RECONSTRUCTION; Right 2010 and 2011: COLONOSCOPY; N/A     Comment:  crohns 2010: ESOPHAGOGASTRODUODENOSCOPY ENDOSCOPY; N/A 2012: laproscopy No date: TONSILLECTOMY     Comment:  at the age of 26  BMI    Body Mass Index: 26.63 kg/m      Reproductive/Obstetrics negative OB ROS                              Anesthesia Physical Anesthesia Plan  ASA: 2  Anesthesia Plan: General   Post-op Pain Management: Tylenol  PO  (pre-op)* and Toradol  IV (intra-op)*   Induction: Intravenous  PONV Risk Score and Plan: Dexamethasone , Ondansetron , Midazolam  and Treatment may vary due to age or medical condition  Airway Management Planned: LMA  Additional Equipment:   Intra-op Plan:   Post-operative Plan: Extubation in OR  Informed Consent: I have reviewed the patients History and Physical, chart, labs and discussed the procedure including the risks, benefits and alternatives for the proposed anesthesia with the patient or authorized representative who has indicated his/her understanding and acceptance.     Dental Advisory Given  Plan Discussed with: Anesthesiologist, CRNA and Surgeon  Anesthesia Plan Comments:          Anesthesia Quick Evaluation

## 2024-05-25 NOTE — H&P (Signed)
 GYNECOLOGY PREOPERATIVE HISTORY AND PHYSICAL   Subjective:  Sarah Edwards is a 33 y.o. [phone] here for surgical management of a vaginal skin tag.   Indications for procedure include: vaginal pain, bleeding. No significant preoperative concerns.  Proposed surgery: vaginal skin tag excision  Pertinent Gynecological History: Menses: flow is moderate and regular every month without intermenstrual spotting Bleeding: N/A Contraception: IUD Last mammogram: N/A Last pap: normal Date: 03/12/24   Past Medical History:  Diagnosis Date   Abnormal uterine bleeding (AUB)    Anemia    Anemia during pregnancy in third trimester 03/21/2017   Anxiety    Asthma    BRCA negative 03/2024   MyRisk neg except POLE VUS   Bright red rectal bleeding 08/19/2018   Chronic UTI    Crohn disease (HCC)    Endometriosis    Family history of breast cancer 03/2024   IBIS=18.4%/riskscore=17.9%   Family history of ovarian cancer    IBS (irritable bowel syndrome)    Painful menstrual periods    SAB (spontaneous abortion) 01/06/2014   Past Surgical History:  Procedure Laterality Date   ANKLE RECONSTRUCTION Right    COLONOSCOPY N/A 2010 and 2011   crohns   ESOPHAGOGASTRODUODENOSCOPY ENDOSCOPY N/A 2010   laproscopy  2012   TONSILLECTOMY     at the age of 55   OB History  Gravida Para Term Preterm AB Living  3 2 2  0 1 2  SAB IAB Ectopic Multiple Live Births  1 0 0 0 2    # Outcome Date GA Lbr Len/2nd Weight Sex Type Anes PTL Lv  3 Term 04/18/17 [redacted]w[redacted]d / 00:14 3580 g F Vag-Spont Local  LIV  2 Term 09/17/09   3062 g F Vag-Spont   LIV  1 SAB        N   Patient denies any other pertinent gynecologic issues.  Family History  Problem Relation Age of Onset   Diabetes Mother    Hypertension Mother    Diabetes Father    Heart disease Father    Hypertension Father    Heart attack Father    Coronary artery disease Father    Healthy Brother    Ovarian cancer Maternal Aunt        unknown age    Pancreatic cancer Maternal Uncle        unknown age   Breast cancer Maternal Grandmother        94s   Leukemia Maternal Grandfather 50   Breast cancer Paternal Grandmother        30s   Ovarian cancer Paternal Grandmother    Cervical cancer Paternal Grandmother    Lung cancer Paternal Grandfather    Colon cancer Neg Hx    Social History   Socioeconomic History   Marital status: Married    Spouse name: Not on file   Number of children: 2   Years of education: Not on file   Highest education level: Not on file  Occupational History    Comment: Investment banker, 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    Comment: Mirena   Other Topics Concern   Not on file  Social History Narrative   Lives in [place] with husband and dtr.  Does not routinely exercise.   Social Drivers of Corporate investment banker Strain: Low Risk  (04/02/2024)   Received from [facility]   Overall Financial Resource Strain (CARDIA)    Difficulty of Paying Living Expenses: Not hard at all  Food Insecurity: No Food Insecurity (04/02/2024)   Received from [facility]   Hunger Vital Sign    Within the past 12 months, you worried that your food would run out before you got the money to buy more.: Never true    Within the past 12 months, the food you bought just didn't last and you didn't have money to get more.: Never true  Transportation Needs: No Transportation Needs (04/02/2024)   Received from  [facility] Transportation    In the past 12 months, has lack of transportation kept you from medical appointments or from getting medications?: No    Lack of Transportation (Non-Medical): No  Physical Activity: Not on file  Stress: Not on file  Social Connections: Not on file  Intimate Partner Violence: Not on file   No current facility-administered medications on file prior to encounter.   Current Outpatient Medications on File Prior to Encounter  Medication Sig Dispense Refill   cetirizine (ZYRTEC) 10 MG tablet Take 10 mg by mouth daily.     dicyclomine  (BENTYL ) 10 MG capsule Take 10 mg by mouth 2 (two) times daily as needed for spasms.     levonorgestrel  (MIRENA ) 20 MCG/24HR IUD 1 each by Intrauterine route once.     Multiple Vitamins-Minerals (WOMENS MULTI PO) Take 1 tablet by mouth daily.     ondansetron  (ZOFRAN -ODT) 4 MG disintegrating tablet Take 1 tablet (4 mg total) by mouth every 8 (eight) hours as needed for nausea or vomiting. 10 tablet 0   OVER THE COUNTER MEDICATION Take 1 capsule by mouth daily. Mushroom vitamin     OVER THE COUNTER MEDICATION Take 1 capsule by mouth daily. Beef organ     VITAMIN D  PO Take 1 tablet by mouth daily.     Allergies  Allergen Reactions   Amoxicillin Rash and Dermatitis   Penicillin G Dermatitis   Azithromycin Swelling   Penicillins Rash    Childhood reaction   Sulfa Antibiotics Nausea And Vomiting    Constant vomiting      Review of Systems Constitutional: No recent fever/chills/sweats Respiratory: No recent cough/bronchitis Cardiovascular: No chest pain Gastrointestinal: No recent nausea/vomiting/diarrhea Genitourinary: No UTI symptoms Hematologic/lymphatic:No history of coagulopathy or recent blood thinner use    Objective:   Blood pressure 120/76, pulse 93, temperature (!) 97.1 F (36.2 C), temperature source Temporal, resp. rate 17, height 5' 5 (1.651 m), weight 72.6 kg, SpO2  98%. CONSTITUTIONAL: Well-developed, well-nourished female in no acute distress.  HENT:  Normocephalic, atraumatic, External right and left ear normal. Oropharynx is clear and moist EYES: Conjunctivae and EOM are normal. Pupils are equal, round, and reactive to light. No scleral icterus.  NECK: Normal range of motion, supple, no masses SKIN: Skin is warm and dry. No rash noted. Not diaphoretic. No erythema. No pallor. NEUROLOGIC:  Alert and oriented to person, place, and time. Normal reflexes, muscle tone coordination. No cranial nerve deficit noted. PSYCHIATRIC: Normal mood and affect. Normal behavior. Normal judgment and thought content. CARDIOVASCULAR: Normal heart rate noted, regular rhythm RESPIRATORY: Effort and breath sounds normal, no problems with respiration noted ABDOMEN: Soft, nontender, nondistended. PELVIC: Deferred MUSCULOSKELETAL: Normal range of motion. No edema and no tenderness. 2+ distal pulses.  Labs: Results for orders placed or performed during the hospital encounter of 05/25/24 (from the past 2 weeks)  Pregnancy, urine POC   Collection Time: 05/25/24  8:01 AM  Result Value Ref Range   Preg Test, Ur NEGATIVE NEGATIVE   Imaging Studies: No results found.  Assessment:     33 y.o. [id number] with a vaginal skin tag protruding after her last vaginal delivery s/p laceration repair, which bleeds and is painful, here for exicsion today.      Plan:   Counseling: Procedure, risks, reasons, benefits and complications (including injury to bowel, bladder, major blood vessel, ureter, bleeding, possibility of transfusion, infection, or fistula formation) reviewed in detail. Likelihood of success in alleviating the patient's condition was discussed. Routine postoperative instructions will be reviewed with the patient and her family in detail after surgery.  The patient concurred with the proposed plan, giving informed written consent for the surgery.    To OR when able    Estil Mangle, DO [facility]

## 2024-05-25 NOTE — Op Note (Signed)
 OPERATIVE NOTE  DATE OF PROCEDURE: 05/25/2024  PRE-OPERATIVE DIAGNOSIS:  1) vaginal skin tag; vaginal pain; vaginal bleeding; dyspareunia  POST-OPERATIVE DIAGNOSIS:  Same, removed Post-Op Diagnosis Codes:    * Skin tag of vaginal mucosa [N89.8]    * Vaginal pain [R10.2]    * Vaginal bleeding [N93.9]    * Introital dyspareunia [N94.11]  OPERATION:  Vaginal mucosal tag excision  SURGEON(S): Surgeons and Role:    * Leigh Sober, MD - Primary   ANESTHESIA: Choice  ESTIMATED BLOOD LOSS: 0 mL  OPERATIVE FINDINGS: vaginal skin tag protruding from introitus and left labia  SPECIMEN:  ID Type Source Tests Collected by Time Destination  1 : Vaginal mucosal tag Tissue PATH Other SURGICAL PATHOLOGY Leigh Sober, MD 05/25/2024 1045     COMPLICATIONS: None  DRAINS: None  DISPOSITION: Stable to recovery room  DESCRIPTION OF PROCEDURE:      The patient was prepped and draped in the dorsal lithotomy position and placed under general anesthesia. The protruding mucosa was grasped with an Allis clamp and excised with the Ligasure exact dissector. 4-0 vicryl stitch was placed over the excised area for extra perineal support. No further protruding mucosa noted. Hemostasis noted. The patient went to recovery room in stable condition.   Sober Leigh, DO [facility]

## 2024-05-25 NOTE — Anesthesia Postprocedure Evaluation (Signed)
 Anesthesia Post Note  Patient: Sarah Edwards  Procedure(s) Performed: EXCISION, SKIN TAG  Patient location during evaluation: PACU Anesthesia Type: General Level of consciousness: awake and alert Pain management: pain level controlled Vital Signs Assessment: post-procedure vital signs reviewed and stable Respiratory status: spontaneous breathing, nonlabored ventilation and respiratory function stable Cardiovascular status: blood pressure returned to baseline and stable Postop Assessment: no apparent nausea or vomiting Anesthetic complications: no   No notable events documented.   Last Vitals:  Vitals:   05/25/24 1215 05/25/24 1228  BP: 120/67 122/72  Pulse: 81 78  Resp: 14 16  Temp: (!) 36.2 C 36.5 C  SpO2: 98% 100%    Last Pain:  Vitals:   05/25/24 1228  TempSrc: Temporal  PainSc: 3                  Sarah Edwards

## 2024-05-25 NOTE — Transfer of Care (Signed)
 Immediate Anesthesia Transfer of Care Note  Patient: Sarah Edwards  Procedure(s) Performed: EXCISION, SKIN TAG  Patient Location: PACU  Anesthesia Type:General  Level of Consciousness: drowsy and patient cooperative  Airway & Oxygen Therapy: Patient Spontanous Breathing and Patient connected to face mask oxygen  Post-op Assessment: Report given to RN and Post -op Vital signs reviewed and stable  Post vital signs: stable  Last Vitals:  Vitals Value Taken Time  BP    Temp    Pulse 50 05/25/24 11:02  Resp 13 05/25/24 11:02  SpO2 100 % 05/25/24 11:02  Vitals shown include unfiled device data.  Last Pain:  Vitals:   05/25/24 0758  TempSrc: Temporal  PainSc: 0-No pain         Complications: No notable events documented.

## 2024-05-26 ENCOUNTER — Encounter: Payer: Self-pay | Admitting: Obstetrics

## 2024-05-26 ENCOUNTER — Ambulatory Visit: Admitting: Internal Medicine

## 2024-05-26 VITALS — BP 118/78 | Ht 65.0 in | Wt 152.0 lb

## 2024-05-26 DIAGNOSIS — F419 Anxiety disorder, unspecified: Secondary | ICD-10-CM

## 2024-05-26 DIAGNOSIS — L299 Pruritus, unspecified: Secondary | ICD-10-CM | POA: Diagnosis not present

## 2024-05-26 DIAGNOSIS — R6889 Other general symptoms and signs: Secondary | ICD-10-CM | POA: Diagnosis not present

## 2024-05-26 DIAGNOSIS — R202 Paresthesia of skin: Secondary | ICD-10-CM | POA: Diagnosis not present

## 2024-05-26 DIAGNOSIS — F32A Depression, unspecified: Secondary | ICD-10-CM

## 2024-05-26 DIAGNOSIS — F430 Acute stress reaction: Secondary | ICD-10-CM

## 2024-05-26 LAB — SURGICAL PATHOLOGY

## 2024-05-26 MED ORDER — HYDROXYZINE HCL 10 MG PO TABS: 10.0000 mg | ORAL_TABLET | Freq: Two times a day (BID) | ORAL | 0 refills | Status: DC | PRN

## 2024-05-26 NOTE — Progress Notes (Signed)
 Subjective:    Patient ID: Wells Bartley Gull, female    DOB: 10/09/1990, 33 y.o.   MRN: [id number]  HPI  Discussed the use of AI scribe software for clinical note transcription with the patient, who gave verbal consent to proceed.  Naylea Breea Loncar is a 33 year old female who presents with persistent itching, tingling, and sweating of the hands.  She has been experiencing persistent itching, tingling, and sweating of her hands for the past couple of weeks. Initially, the symptoms were confined to her palms but have since spread to the entire hand and between her fingers. The sensation is described as constant, with her hands feeling as if they are 'asleep'. There is no associated rash, and her hands are not dry, although she mentions dead skin due to scraping with a butter knife to alleviate the sensation. Relief is found by using ice packs on her hands.  She takes Zyrtec daily.  She reports a lack of strength in her right hand, which is her dominant hand, and experiences pain in the knuckles of her pinky and index fingers, describing it as feeling like she 'punched a wall'.   She experiences difficulty regulating her body temperature, with episodes of extreme heat leading to vomiting and fainting. This has impacted her ability to participate in activities such as karate. She has been on Lexapro  for a couple of months, which she feels has improved her energy levels, but she is concerned it may be contributing to her symptoms.   Her social history includes significant stress related to her husband's health and her father's upcoming triple bypass surgery. She has been managing these stressors while trying to maintain her household and care for her family.       Review of Systems  Past Medical History:  Diagnosis Date   Abnormal uterine bleeding (AUB)    Anemia    Anemia during pregnancy in third trimester 03/21/2017   Anxiety    Asthma    BRCA negative 03/2024   MyRisk neg except POLE VUS    Bright red rectal bleeding 08/19/2018   Chronic UTI    Crohn disease (HCC)    Endometriosis    Family history of breast cancer 03/2024   IBIS=18.4%/riskscore=17.9%   Family history of ovarian cancer    IBS (irritable bowel syndrome)    Painful menstrual periods    SAB (spontaneous abortion) 01/06/2014    Current Outpatient Medications  Medication Sig Dispense Refill   cetirizine (ZYRTEC) 10 MG tablet Take 10 mg by mouth daily.     dicyclomine  (BENTYL ) 10 MG capsule Take 10 mg by mouth 2 (two) times daily as needed for spasms.     escitalopram  (LEXAPRO ) 10 MG tablet Take 1 tablet (10 mg total) by mouth daily. 30 tablet 0   ibuprofen  (ADVIL ) 800 MG tablet Take 1 tablet (800 mg total) by mouth every 8 (eight) hours as needed for moderate pain (pain score 4-6) or cramping. 30 tablet 0   levonorgestrel  (MIRENA ) 20 MCG/24HR IUD 1 each by Intrauterine route once.     Multiple Vitamins-Minerals (WOMENS MULTI PO) Take 1 tablet by mouth daily.     ondansetron  (ZOFRAN -ODT) 4 MG disintegrating tablet Take 1 tablet (4 mg total) by mouth every 8 (eight) hours as needed for nausea or vomiting. 10 tablet 0   OVER THE COUNTER MEDICATION Take 1 capsule by mouth daily. Mushroom vitamin     OVER THE COUNTER MEDICATION Take 1 capsule by mouth daily. Beef  organ     oxyCODONE -acetaminophen  (PERCOCET/ROXICET) 5-325 MG tablet Take 1 tablet by mouth every 8 (eight) hours as needed for severe pain (pain score 7-10). 3 tablet 0   VITAMIN D  PO Take 1 tablet by mouth daily.     No current facility-administered medications for this visit.    Allergies  Allergen Reactions   Amoxicillin Rash and Dermatitis   Penicillin G Dermatitis   Azithromycin Swelling   Penicillins Rash    Childhood reaction   Sulfa Antibiotics Nausea And Vomiting    Constant vomiting    Family History  Problem Relation Age of Onset   Diabetes Mother    Hypertension Mother    Diabetes Father    Heart disease Father     Hypertension Father    Heart attack Father    Coronary artery disease Father    Healthy Brother    Ovarian cancer Maternal Aunt        unknown age   Pancreatic cancer Maternal Uncle        unknown age   Breast cancer Maternal Grandmother        28s   Leukemia Maternal Grandfather 58   Breast cancer Paternal Grandmother        30s   Ovarian cancer Paternal Grandmother    Cervical cancer Paternal Grandmother    Lung cancer Paternal Grandfather    Colon cancer Neg Hx     Social History   Socioeconomic History   Marital status: Married    Spouse name: Not on file   Number of children: 2   Years of education: Not on file   Highest education level: Not on file  Occupational History    Comment: Investment banker, [employer] - [employer]  Tobacco Use   Smoking status: Former    Current packs/day: 0.00    Average packs/day: 0.5 packs/day for 3.0 years (1.5 ttl pk-yrs)    Types: Cigarettes    Start date: 03/17/2012    Quit date: 03/18/2015    Years since quitting: 9.1   Smokeless tobacco: Never  Vaping Use   Vaping status: Some Days   Substances: THC   Devices: THC Oil  Substance and Sexual Activity   Alcohol use: Not Currently   Drug use: Yes    Types: Marijuana    Comment: oil   Sexual activity: Yes    Birth control/protection: I.U.D.    Comment: Mirena   Other Topics Concern   Not on file  Social History Narrative   Lives in [place] with husband and dtr.  Does not routinely exercise.   Social Drivers of Corporate investment banker Strain: Low Risk  (04/02/2024)   Received from [facility]   Overall Financial Resource Strain (CARDIA)    Difficulty of Paying Living Expenses: Not hard at all  Food Insecurity: No Food Insecurity (04/02/2024)   Received from [facility]   Hunger Vital Sign    Within the past 12 months, you worried that your food would run out before you got the money to buy more.: Never true    Within the past 12 months, the  food you bought just didn't last and you didn't have money to get more.: Never true  Transportation Needs: No Transportation Needs (04/02/2024)   Received from [facility] - Transportation    In the past 12 months, has lack of transportation kept you from medical appointments or from getting medications?: No  Lack of Transportation (Non-Medical): No  Physical Activity: Not on file  Stress: Not on file  Social Connections: Not on file  Intimate Partner Violence: Not on file     Constitutional: Denies headache, fever, malaise, fatigue or abrupt weight changes.  HEENT: Denies eye pain, eye redness, ear pain, ringing in the ears, wax buildup, runny nose, nasal congestion, bloody nose, or sore throat. Respiratory: Denies difficulty breathing, shortness of breath, cough or sputum production.   Cardiovascular: Denies chest pain, chest tightness, palpitations or swelling in the hands or feet.  Gastrointestinal: Patient reports LUQ abdominal pain, intermittent nausea and vomiting.  Denies abdominal pain, bloating, constipation, diarrhea or blood in the stool.  GU: Denies urgency, frequency, pain with urination, burning sensation, blood in urine, odor or discharge. Musculoskeletal: Patient reports pain under her left rib cage.  Denies decrease in range of motion, difficulty with gait, or joint swelling.  Skin: Patient reports itching and sweating of palms.  Denies redness, rashes, lesions or ulcercations.  Neurological: Patient reports eating tolerance paresthesia of hands.  Denies dizziness, difficulty with memory, difficulty with speech or problems with balance and coordination.  Psych: Patient has a history of anxiety and depression, recent stress.  Denies SI/HI.  No other specific complaints in a complete review of systems (except as listed in HPI above).     Objective:   Physical Exam  BP 118/78 (BP Location: Right Arm, Patient Position: Sitting, Cuff Size:  Normal)   Ht 5' 5 (1.651 m)   Wt 152 lb (68.9 kg)   LMP 05/21/2024 (Approximate)   BMI 25.29 kg/m    Wt Readings from Last 3 Encounters:  05/26/24 152 lb (68.9 kg)  05/25/24 160 lb (72.6 kg)  05/19/24 160 lb (72.6 kg)    General: Appears her stated age, well developed, well nourished in NAD. Skin: Warm, dry and intact. No rashes noted. Cardiovascular: Tachycardic with normal rhythm.  Pulmonary/Chest: Normal effort and positive vesicular breath sounds. No respiratory distress. No wheezes, rales or ronchi noted.  Musculoskeletal: No joint swelling noted.  Normal flexion, extension and rotation of the wrist.  Handgrips equal. Neurological: Alert and oriented.  Negative Tinel's.  Negative Phalen's.  Coordination normal.  Psychiatric: Tearful. Judgment and thought content normal.     BMET    Component Value Date/Time   NA 139 10/01/2023 1508   NA 138 09/18/2014 1609   K 3.9 10/01/2023 1508   K 3.5 09/18/2014 1609   CL 104 10/01/2023 1508   CL 102 09/18/2014 1609   CO2 28 10/01/2023 1508   CO2 26 09/18/2014 1609   GLUCOSE 107 10/01/2023 1508   GLUCOSE 96 09/18/2014 1609   BUN 11 10/01/2023 1508   BUN 5 (L) 09/18/2014 1609   CREATININE 0.74 10/01/2023 1508   CALCIUM  9.6 10/01/2023 1508   CALCIUM  8.7 09/18/2014 1609   GFRNONAA >60 06/27/2020 1556   GFRNONAA >60 09/18/2014 1609   GFRNONAA >60 01/19/2012 1125   GFRAA >60 08/21/2018 1317   GFRAA >60 09/18/2014 1609   GFRAA >60 01/19/2012 1125    Lipid Panel     Component Value Date/Time   CHOL 166 10/01/2023 1508   CHOL 143 09/01/2015 1202   TRIG 101 10/01/2023 1508   HDL 50 10/01/2023 1508   HDL 65 09/01/2015 1202   CHOLHDL 3.3 10/01/2023 1508   LDLCALC 97 10/01/2023 1508    CBC    Component Value Date/Time   WBC 6.4 10/01/2023 1508   RBC 4.51 10/01/2023 1508  HGB 13.6 10/01/2023 1508   HGB 8.8 (L) 03/05/2017 0949   HCT 40.6 10/01/2023 1508   HCT 29.3 (L) 03/05/2017 0949   PLT 240 10/01/2023 1508   PLT  234 08/31/2016 0926   MCV 90.0 10/01/2023 1508   MCV 78 (L) 08/31/2016 0926   MCV 79 (L) 09/18/2014 1609   MCH 30.2 10/01/2023 1508   MCHC 33.5 10/01/2023 1508   RDW 11.7 10/01/2023 1508   RDW 15.6 (H) 08/31/2016 0926   RDW 14.5 09/18/2014 1609   LYMPHSABS 1.9 11/20/2021 1007   LYMPHSABS 1.2 08/31/2016 0926   LYMPHSABS 1.4 09/18/2014 1609   MONOABS 0.3 11/20/2021 1007   MONOABS 0.4 09/18/2014 1609   EOSABS 0.1 11/20/2021 1007   EOSABS 0.0 08/31/2016 0926   EOSABS 0.0 09/18/2014 1609   BASOSABS 0.0 11/20/2021 1007   BASOSABS 0.0 08/31/2016 0926   BASOSABS 0.0 09/18/2014 1609    Hgb A1C Lab Results  Component Value Date   HGBA1C 5.4 10/01/2023           Assessment & Plan:   Assessment and Plan    Bilateral hand pruritus and paresthesia Symptoms include itching, sweating, tingling, and burning sensations without rash or dryness. Negative carpal tunnel tests. Differential diagnosis suggests stress-related etiology. Previous MS evaluations negative. Normal labs. - Prescribed hydroxyzine  10 mg twice daily for itching. Monitor for drowsiness. - Discussed with GYN the possibility of increasing Lexapro  if stress is a factor.  Dysregulation of body temperature with heat intolerance and vomiting Episodes of heat intolerance and vomiting coinciding with Lexapro  initiation. Labs normal. Increased energy and mood reported since starting Lexapro . Concerns about Lexapro 's role in symptoms. - Discussed with GYN potential Lexapro  side effects, including heat intolerance and vomiting. Consider dosage adjustment.  Acute stress reaction, anxiety and depression Managed with Lexapro , which improved energy and mood. Concerns about side effects, including heat intolerance. Stress from family circumstances may contribute to symptoms. Lexapro  for short-term stress management. - Continue Lexapro . Discuss with GYN dosage adjustment if side effects persist. - Follow up with GYN to reassess  treatment plan.        RTC in 6 months, follow-up chronic conditions Angeline Laura, NP

## 2024-05-26 NOTE — Patient Instructions (Signed)

## 2024-05-28 ENCOUNTER — Encounter: Payer: Self-pay | Admitting: Internal Medicine

## 2024-05-31 NOTE — Progress Notes (Addendum)
 Antonette Angeline ORN, NP   Chief Complaint  Patient presents with   Follow-up    Anx and Dep, feels okay.    HPI:      Sarah Edwards is a 33 y.o. [id number] whose LMP was Patient's last menstrual period was 05/25/2024 (approximate)., presents today for anxiety and depression f/u from 6/25. Started on lexapro  10 mg. Doing well, feels much better. Has motivation, feels happy again. Is sleeping well at night but smokes MJ before bed. Has many family stressors and lots of stress.   Having issues with hyperhidrosis and itching of palms and feet with tingling. Also having migraines over the past year, no hx of migraines in past, no FH.  Saw PCP who thought was stress. Pt with heat and exercise intolerance for several months, getting worse. Can only do 30-60 min activity before having to sit for about 30 min. This includes cleaning the house/washing dishes. Feels nauseated, hot and pre-syncopal at times. Has drenching sweats during the day then feels hot. Has food intolerances so doing FODMAP diet; unable to eat as much but eats something BID. Bruises very easily with little touch. Taking MVI.   03/12/24 NOTE: Has had long hx of anxiety but now with depression sx as well since quit her job 11/24. Has mood changes/irritability/no motivation/feels overwhelmed/can't concentrate to do tasks. Can't turn brain off so has trouble sleeping. No panic attacks. Not seeing counselor. No SI.Treated with zoloft in past for anxiety that made her mean so she stopped it. No other med tx.   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   Acid reflux 01/12/2016   Anxiety and depression 01/07/2015    Past Surgical History:  Procedure Laterality Date   ANKLE RECONSTRUCTION Right    COLONOSCOPY N/A 2010 and 2011   crohns   ESOPHAGOGASTRODUODENOSCOPY ENDOSCOPY N/A 2010   EXCISION OF SKIN TAG N/A 05/25/2024   Procedure: EXCISION, SKIN TAG;  Surgeon: Leigh Sober, MD;  Location: [facility] ORS;  Service: Gynecology;  Laterality: N/A;  Vaginal   laproscopy  2012   TONSILLECTOMY     at the age of 75    Family History  Problem Relation Age of Onset   Diabetes Mother    Hypertension Mother    Diabetes Father    Heart disease Father    Hypertension Father    Heart attack Father    Coronary artery disease Father    Healthy Brother    Ovarian cancer Maternal Aunt        unknown age   Pancreatic cancer Maternal Uncle        unknown age   Breast cancer Maternal Grandmother        19s   Leukemia Maternal Grandfather 36   Breast cancer Paternal Grandmother        30s   Ovarian cancer Paternal Grandmother    Cervical cancer Paternal Grandmother    Lung cancer Paternal Grandfather    Colon cancer Neg Hx     Social History   Socioeconomic History   Marital status: Married  Spouse name: Not on file   Number of children: 2   Years of education: Not on file   Highest education level: Not on file  Occupational History    Comment: Investment banker, [employer] - [employer]  Tobacco Use   Smoking status: Former    Current packs/day: 0.00    Average packs/day: 0.5 packs/day for 3.0 years (1.5 ttl pk-yrs)    Types: Cigarettes    Start date: 03/17/2012    Quit date: 03/18/2015    Years since quitting: 9.2   Smokeless tobacco: Never  Vaping Use   Vaping status: Some Days   Substances: THC   Devices: THC Oil  Substance and Sexual Activity   Alcohol use: Not Currently   Drug use: Yes    Types: Marijuana    Comment: oil   Sexual activity: Not Currently    Birth control/protection: I.U.D.    Comment:  Mirena   Other Topics Concern   Not on file  Social History Narrative   Lives in [place] with husband and dtr.  Does not routinely exercise.   Social Drivers of Corporate investment banker Strain: Low Risk  (04/02/2024)   Received from [facility]   Overall Financial Resource Strain (CARDIA)    Difficulty of Paying Living Expenses: Not hard at all  Food Insecurity: No Food Insecurity (04/02/2024)   Received from [facility]   Hunger Vital Sign    Within the past 12 months, you worried that your food would run out before you got the money to buy more.: Never true    Within the past 12 months, the food you bought just didn't last and you didn't have money to get more.: Never true  Transportation Needs: No Transportation Needs (04/02/2024)   Received from [facility] - Transportation    In the past 12 months, has lack of transportation kept you from medical appointments or from getting medications?: No    Lack of Transportation (Non-Medical): No  Physical Activity: Not on file  Stress: Not on file  Social Connections: Not on file  Intimate Partner Violence: Not on file    Outpatient Medications Prior to Visit  Medication Sig Dispense Refill   cetirizine (ZYRTEC) 10 MG tablet Take 10 mg by mouth daily.     ibuprofen  (ADVIL ) 800 MG tablet Take 1 tablet (800 mg total) by mouth every 8 (eight) hours as needed for moderate pain (pain score 4-6) or cramping. 30 tablet 0   levonorgestrel  (MIRENA ) 20 MCG/24HR IUD 1 each by Intrauterine route once.     Multiple Vitamins-Minerals (WOMENS MULTI PO) Take 1 tablet by mouth daily.     ondansetron  (ZOFRAN -ODT) 4 MG disintegrating tablet Take 1 tablet (4 mg total) by mouth every 8 (eight) hours as needed for nausea or vomiting. 10 tablet 0   OVER THE COUNTER MEDICATION Take 1 capsule by mouth daily. Mushroom vitamin     OVER THE COUNTER MEDICATION Take 1 capsule by mouth daily. Beef organ      VITAMIN D  PO Take 1 tablet by mouth daily.     escitalopram  (LEXAPRO ) 10 MG tablet Take 1 tablet (10 mg total) by mouth daily. 30 tablet 0   dicyclomine  (BENTYL ) 10 MG capsule Take 10 mg by mouth 2 (two) times daily as needed for spasms. (Patient not taking: Reported on 06/02/2024)     hydrOXYzine  (ATARAX ) 10 MG tablet Take 1 tablet (10 mg total) by mouth 2 (two)  times daily as needed. 180 tablet 0   oxyCODONE -acetaminophen  (PERCOCET/ROXICET) 5-325 MG tablet Take 1 tablet by mouth every 8 (eight) hours as needed for severe pain (pain score 7-10). 3 tablet 0   No facility-administered medications prior to visit.      ROS:  Review of Systems  Constitutional:  Negative for fever.  Gastrointestinal:  Negative for blood in stool, constipation, diarrhea, nausea and vomiting.  Endocrine: Positive for heat intolerance.  Genitourinary:  Negative for dyspareunia, dysuria, flank pain, frequency, hematuria, urgency, vaginal bleeding, vaginal discharge and vaginal pain.  Musculoskeletal:  Negative for back pain.  Skin:  Negative for rash.  Neurological:  Positive for headaches.    OBJECTIVE:   Vitals:  BP 114/71   Pulse 82   Ht 5' 5 (1.651 m)   Wt 149 lb (67.6 kg)   LMP 05/25/2024 (Approximate)   BMI 24.79 kg/m   Physical Exam Vitals reviewed.  Constitutional:      Appearance: She is well-developed.  Pulmonary:     Effort: Pulmonary effort is normal.  Musculoskeletal:        General: Normal range of motion.     Cervical back: Normal range of motion.  Skin:    General: Skin is warm and dry.  Neurological:     General: No focal deficit present.     Mental Status: She is alert and oriented to person, place, and time.     Cranial Nerves: No cranial nerve deficit.  Psychiatric:        Mood and Affect: Mood normal.        Behavior: Behavior normal.        Thought Content: Thought content normal.        Judgment: Judgment normal.     Results:    06/02/2024   11:25 AM  05/26/2024    9:50 AM 03/12/2024   11:43 AM 11/19/2023   11:39 AM  GAD 7 : Generalized Anxiety Score  Nervous, Anxious, on Edge 1 2 2  0  Control/stop worrying 1 2 3 1   Worry too much - different things 1 2 3 1   Trouble relaxing 0 1 2 0  Restless 0 1 1 0  Easily annoyed or irritable 1 2 3 1   Afraid - awful might happen 0 1 1 0  Total GAD 7 Score 4 11 15 3   Anxiety Difficulty Not difficult at all Somewhat difficult Somewhat difficult Not difficult at all       06/02/2024   11:25 AM  Depression screen PHQ 2/9  Decreased Interest 0  Down, Depressed, Hopeless 1  PHQ - 2 Score 1  Altered sleeping 0  Tired, decreased energy 1  Change in appetite 1  Feeling bad or failure about yourself  0  Trouble concentrating 1  Moving slowly or fidgety/restless 0  Suicidal thoughts 0  PHQ-9 Score 4  Difficult doing work/chores Not difficult at all     Assessment/Plan: Anxiety and depression - Plan: escitalopram  (LEXAPRO ) 10 MG tablet; pt's sx improved. Rx RF eRxd. Recommended therapist, calming techniques, breathing, stretching/pilates. F/u prn.   Heat intolerance - Plan: CBC with Differential/Platelet, Comprehensive metabolic panel with GFR, TSH + free T4, Cortisol; chck labs, will f/u with results. If neg, most likely due to stress. Discussed calming techniques/therapist.  Bruising - Plan: Cortisol; check cortisol given other sx as well.   Stress--sx of hyperhidrosis, nausea, activity intolerance.    Meds ordered this encounter  Medications   escitalopram  (LEXAPRO ) 10 MG tablet  Sig: Take 1 tablet (10 mg total) by mouth daily.    Dispense:  90 tablet    Refill:  3    Supervising Provider:   ROBY, MICIA [[id number]]      Return if symptoms worsen or fail to improve.  Angelos Wasco B. Haston Casebolt, PA-C 06/02/2024 11:33 AM

## 2024-06-02 ENCOUNTER — Encounter: Payer: Self-pay | Admitting: Obstetrics and Gynecology

## 2024-06-02 ENCOUNTER — Ambulatory Visit: Admitting: Internal Medicine

## 2024-06-02 ENCOUNTER — Encounter: Payer: Self-pay | Admitting: Internal Medicine

## 2024-06-02 ENCOUNTER — Ambulatory Visit (INDEPENDENT_AMBULATORY_CARE_PROVIDER_SITE_OTHER): Admitting: Obstetrics and Gynecology

## 2024-06-02 VITALS — BP 124/84 | Ht 65.0 in | Wt 149.0 lb

## 2024-06-02 VITALS — BP 114/71 | HR 82 | Ht 65.0 in | Wt 149.0 lb

## 2024-06-02 DIAGNOSIS — F439 Reaction to severe stress, unspecified: Secondary | ICD-10-CM

## 2024-06-02 DIAGNOSIS — F419 Anxiety disorder, unspecified: Secondary | ICD-10-CM

## 2024-06-02 DIAGNOSIS — R6889 Other general symptoms and signs: Secondary | ICD-10-CM | POA: Diagnosis not present

## 2024-06-02 DIAGNOSIS — K219 Gastro-esophageal reflux disease without esophagitis: Secondary | ICD-10-CM

## 2024-06-02 DIAGNOSIS — K5090 Crohn's disease, unspecified, without complications: Secondary | ICD-10-CM | POA: Diagnosis not present

## 2024-06-02 DIAGNOSIS — F32A Depression, unspecified: Secondary | ICD-10-CM

## 2024-06-02 DIAGNOSIS — Z23 Encounter for immunization: Secondary | ICD-10-CM | POA: Diagnosis not present

## 2024-06-02 DIAGNOSIS — T148XXA Other injury of unspecified body region, initial encounter: Secondary | ICD-10-CM

## 2024-06-02 DIAGNOSIS — L299 Pruritus, unspecified: Secondary | ICD-10-CM | POA: Insufficient documentation

## 2024-06-02 MED ORDER — ESCITALOPRAM OXALATE 10 MG PO TABS: 10.0000 mg | ORAL_TABLET | Freq: Every day | ORAL | 3 refills | Status: AC

## 2024-06-02 NOTE — Assessment & Plan Note (Signed)
 Resolved after laparoscopic surgery She will continue to follow with GYN

## 2024-06-02 NOTE — Assessment & Plan Note (Signed)
 Continue escitalopram  10 mg daily Encouraged her to seek out a psychologist for therapy sessions Support offered

## 2024-06-02 NOTE — Assessment & Plan Note (Signed)
 Encouraged stress reduction techniques Continue cetirizine 10 mg daily

## 2024-06-02 NOTE — Patient Instructions (Signed)
 Managing Stress, Adult Feeling a certain amount of stress is normal. Stress helps our body and mind get ready to deal with the demands of life. Stress hormones can motivate you to do well at work and meet your responsibilities. But severe or long-term (chronic) stress can affect your mental and physical health. Chronic stress puts you at higher risk for: Anxiety and depression. Other health problems such as digestive problems, muscle aches, heart disease, high blood pressure, and stroke. What are the causes? Common causes of stress include: Demands from work, such as deadlines, feeling overworked, or having long hours. Pressures at home, such as money issues, disagreements with a spouse, or parenting issues. Pressures from major life changes, such as divorce, moving, loss of a loved one, or chronic illness. You may be at higher risk for stress-related problems if you: Do not get enough sleep. Are in poor health. Do not have emotional support. Have a mental health disorder such as anxiety or depression. How to recognize stress Stress can make you: Have trouble sleeping. Feel sad, anxious, irritable, or overwhelmed. Lose your appetite. Overeat or want to eat unhealthy foods. Want to use drugs or alcohol. Stress can also cause physical symptoms, such as: Sore, tense muscles, especially in the shoulders and neck. Headaches. Trouble breathing. A faster heart rate. Stomach pain, nausea, or vomiting. Diarrhea or constipation. Trouble concentrating. Follow these instructions at home: Eating and drinking Eat a healthy diet. This includes: Eating foods that are high in fiber, such as beans, whole grains, and fresh fruits and vegetables. Limiting foods that are high in fat and processed sugars, such as fried or sweet foods. Do not skip meals or overeat. Drink enough fluid to keep your urine pale yellow. Alcohol use Do not drink alcohol if: Your health care provider tells you not to  drink. You are pregnant, may be pregnant, or are planning to become pregnant. Drinking alcohol is a way some people try to ease their stress. This can be dangerous, so if you drink alcohol: Limit how much you have to: 0-1 drink a day for women. 0-2 drinks a day for men. Know how much alcohol is in your drink. In the [place] one drink equals one 12 oz bottle of beer (355 mL), one 5 oz glass of wine (148 mL), or one 1 oz glass of hard liquor (44 mL). Activity  Include 30 minutes of exercise in your daily schedule. Exercise is a good stress reducer. Include time in your day for an activity that you find relaxing. Try taking a walk, going on a bike ride, reading a book, or listening to music. Schedule your time in a way that lowers stress, and keep a regular schedule. Focus on doing what is most important to get done. Lifestyle Identify the source of your stress and your reaction to it. See a therapist who can help you change unhelpful reactions. When there are stressful events: Talk about them with family, friends, or coworkers. Try to think realistically about stressful events and not ignore them or overreact. Try to find the positives in a stressful situation and not focus on the negatives. Cut back on responsibilities at work and home, if possible. Ask for help from friends or family members if you need it. Find ways to manage stress, such as: Mindfulness, meditation, or deep breathing. Yoga or tai chi. Progressive muscle relaxation. Spending time in nature. Doing art, playing music, or reading. Making time for fun activities. Spending time with family and friends. Get support  from family, friends, or spiritual resources. General instructions Get enough sleep. Try to go to sleep and get up at about the same time every day. Take over-the-counter and prescription medicines only as told by your health care provider. Do not use any products that contain nicotine or tobacco. These products  include cigarettes, chewing tobacco, and vaping devices, such as e-cigarettes. If you need help quitting, ask your health care provider. Do not use drugs or smoke to deal with stress. Keep all follow-up visits. This is important. Where to find support Talk with your health care provider about stress management or finding a support group. Find a therapist to work with you on your stress management techniques. Where to find more information [employer] on Mental Illness: www.nami.org American Psychological Association: DiceTournament.ca Contact a health care provider if: Your stress symptoms get worse. You are unable to manage your stress at home. You are struggling to stop using drugs or alcohol. Get help right away if: You may be a danger to yourself or others. You have any thoughts of death or suicide. Get help right awayif you feel like you may hurt yourself or others, or have thoughts about taking your own life. Go to your nearest emergency room or: Call 911. Call the National Suicide Prevention Lifeline at (304)678-3299 or 988 in the [place] This is open 24 hours a day. If you're a Veteran: Call 988 and press 1. This is open 24 hours a day. Text the [employer] at 226-132-3352. Summary Feeling a certain amount of stress is normal, but severe or long-term (chronic) stress can affect your mental and physical health. Chronic stress can put you at higher risk for anxiety, depression, and other health problems such as digestive problems, muscle aches, heart disease, high blood pressure, and stroke. You may be at higher risk for stress-related problems if you do not get enough sleep, are in poor health, lack emotional support, or have a mental health disorder such as anxiety or depression. Identify the source of your stress and your reaction to it. Try talking about stressful events with family, friends, or coworkers, finding a coping method, or getting support from spiritual resources. If  you need more help, talk with your health care provider about finding a support group or a mental health therapist. This information is not intended to replace advice given to you by your health care provider. Make sure you discuss any questions you have with your health care provider. Document Revised: 04/18/2023 Document Reviewed: 03/28/2021 Elsevier Patient Education  2024 [employer].

## 2024-06-02 NOTE — Patient Instructions (Signed)
 I value your feedback and you entrusting [place] with your care. If you get a [facility] [place] patient survey, I would appreciate you taking the time to let us know about your experience today. Thank you! ? ? ?

## 2024-06-02 NOTE — Progress Notes (Signed)
 Subjective:    Patient ID: Sarah Edwards, female    DOB: 03/13/1991, 33 y.o.   MRN: [id number]  HPI  Patient presents to clinic today to establish care and for management of the conditions listed below.  GERD/Crohn's: Intermittent epigastric pain, vomiting and loose stools.  She is not taking dicyclomine  because it was causing dizziness. She manages this with diet.  She is not following with GI.  Anxiety: Chronic managed on escitalopram . She does have excessive sweating, intermittent dizziness and nasuea which she thinks is related to the medication.  She is not seeing a therapist but is considering seeing one.  She denies depression, SI/HI.  Pruritus: She feels like this is stress induced. She was recently started on hydroxyzine  but has not started this yet, but is taking cetirizine as prescribed. She has not seen an allergist or dermatologist.     Past Medical History:  Diagnosis Date   Abnormal uterine bleeding (AUB)    Anemia    Anemia during pregnancy in third trimester 03/21/2017   Anxiety    Asthma    BRCA negative 03/2024   MyRisk neg except POLE VUS   Bright red rectal bleeding 08/19/2018   Chronic UTI    Crohn disease (HCC)    Endometriosis    Family history of breast cancer 03/2024   IBIS=18.4%/riskscore=17.9%   Family history of ovarian cancer    IBS (irritable bowel syndrome)    Painful menstrual periods    SAB (spontaneous abortion) 01/06/2014    Current Outpatient Medications  Medication Sig Dispense Refill   cetirizine (ZYRTEC) 10 MG tablet Take 10 mg by mouth daily.     dicyclomine  (BENTYL ) 10 MG capsule Take 10 mg by mouth 2 (two) times daily as needed for spasms.     escitalopram  (LEXAPRO ) 10 MG tablet Take 1 tablet (10 mg total) by mouth daily. 30 tablet 0   hydrOXYzine  (ATARAX ) 10 MG tablet Take 1 tablet (10 mg total) by mouth 2 (two) times daily as needed. 180 tablet 0   ibuprofen  (ADVIL ) 800 MG tablet Take 1 tablet (800 mg total) by mouth every 8  (eight) hours as needed for moderate pain (pain score 4-6) or cramping. 30 tablet 0   levonorgestrel  (MIRENA ) 20 MCG/24HR IUD 1 each by Intrauterine route once.     Multiple Vitamins-Minerals (WOMENS MULTI PO) Take 1 tablet by mouth daily.     ondansetron  (ZOFRAN -ODT) 4 MG disintegrating tablet Take 1 tablet (4 mg total) by mouth every 8 (eight) hours as needed for nausea or vomiting. 10 tablet 0   OVER THE COUNTER MEDICATION Take 1 capsule by mouth daily. Mushroom vitamin     OVER THE COUNTER MEDICATION Take 1 capsule by mouth daily. Beef organ     oxyCODONE -acetaminophen  (PERCOCET/ROXICET) 5-325 MG tablet Take 1 tablet by mouth every 8 (eight) hours as needed for severe pain (pain score 7-10). 3 tablet 0   VITAMIN D  PO Take 1 tablet by mouth daily.     No current facility-administered medications for this visit.    Allergies  Allergen Reactions   Amoxicillin Rash and Dermatitis   Penicillin G Dermatitis   Azithromycin Swelling   Penicillins Rash    Childhood reaction   Sulfa Antibiotics Nausea And Vomiting    Constant vomiting    Family History  Problem Relation Age of Onset   Diabetes Mother    Hypertension Mother    Diabetes Father    Heart disease Father    Hypertension Father  Heart attack Father    Coronary artery disease Father    Healthy Brother    Ovarian cancer Maternal Aunt        unknown age   Pancreatic cancer Maternal Uncle        unknown age   Breast cancer Maternal Grandmother        66s   Leukemia Maternal Grandfather 41   Breast cancer Paternal Grandmother        30s   Ovarian cancer Paternal Grandmother    Cervical cancer Paternal Grandmother    Lung cancer Paternal Grandfather    Colon cancer Neg Hx     Social History   Socioeconomic History   Marital status: Married    Spouse name: Not on file   Number of children: 2   Years of education: Not on file   Highest education level: Not on file  Occupational History    Comment: Occupational hygienist - [employer]  Tobacco Use   Smoking status: Former    Current packs/day: 0.00    Average packs/day: 0.5 packs/day for 3.0 years (1.5 ttl pk-yrs)    Types: Cigarettes    Start date: 03/17/2012    Quit date: 03/18/2015    Years since quitting: 9.2   Smokeless tobacco: Never  Vaping Use   Vaping status: Some Days   Substances: THC   Devices: THC Oil  Substance and Sexual Activity   Alcohol use: Not Currently   Drug use: Yes    Types: Marijuana    Comment: oil   Sexual activity: Yes    Birth control/protection: I.U.D.    Comment: Mirena   Other Topics Concern   Not on file  Social History Narrative   Lives in [place] with husband and dtr.  Does not routinely exercise.   Social Drivers of Corporate investment banker Strain: Low Risk  (04/02/2024)   Received from [facility]   Overall Financial Resource Strain (CARDIA)    Difficulty of Paying Living Expenses: Not hard at all  Food Insecurity: No Food Insecurity (04/02/2024)   [facility]   Hunger Vital Sign    Within the past 12 months, you worried that your food would run out before you got the money to buy more.: Never true    Within the past 12 months, the food you bought just didn't last and you didn't have money to get more.: Never true  Transportation Needs: No Transportation Needs (04/02/2024)   Received from [facility] - Transportation    In the past 12 months, has lack of transportation kept you from medical appointments or from getting medications?: No    Lack of Transportation (Non-Medical): No  Physical Activity: Not on file  Stress: Not on file  Social Connections: Not on file  Intimate Partner Violence: Not on file    ROS:  Constitutional: Denies fever, malaise, fatigue, headache or abrupt weight changes.  HEENT:  Denies eye pain, eye redness, ear pain, ringing in the ears, wax buildup, runny nose, nasal congestion, bloody  nose, or sore throat. Respiratory: Denies difficulty breathing, shortness of breath, cough or sputum production.   Cardiovascular: Denies chest pain, chest tightness, palpitations or swelling in the hands or feet.  Gastrointestinal: Patient reports intermittent abdominal pain, vomiting and diarrhea.  Denies bloating, constipation, or blood in the stool.  GU: Denies frequency, urgency, pain with urination, blood in urine, odor or discharge. Musculoskeletal: Denies decrease in  range of motion, difficulty with gait, muscle pain or joint pain and swelling.  Skin: Pt reports itching of palms, excessive sweating. Denies redness, rashes, lesions or ulcercations.  Neurological: Patient reports intermittent lightheadedness.  Denies dizziness, difficulty with memory, difficulty with speech or problems with balance and coordination.  Psych: Patient has a history of anxiety.  Denies depression, SI/HI.  No other specific complaints in a complete review of systems (except as listed in HPI above).  PE:  BP 124/84 (BP Location: Left Arm, Patient Position: Sitting, Cuff Size: Normal)   Ht 5' 5 (1.651 m)   Wt 149 lb (67.6 kg)   LMP 05/21/2024 (Approximate)   BMI 24.79 kg/m   Wt Readings from Last 3 Encounters:  05/26/24 152 lb (68.9 kg)  05/25/24 160 lb (72.6 kg)  05/19/24 160 lb (72.6 kg)    General: Appears her stated age, well developed, well nourished in NAD. HEENT: Head: normal shape and size, no sinus tenderness noted; Eyes: sclera white, no icterus, conjunctiva pink, PERRLA and EOMs intact;  Cardiovascular: Normal rate and rhythm. S1,S2 noted.  No murmur, rubs or gallops noted.  Pulmonary/Chest: Normal effort and positive vesicular breath sounds. No respiratory distress. No wheezes, rales or ronchi noted.  Abdomen: Normal bowel sounds. Musculoskeletal: No difficulty with gait.  Neurological: Alert and oriented. Psychiatric:  Anxious appearing and tearful today. Judgment and thought content  normal.     BMET    Component Value Date/Time   NA 139 10/01/2023 1508   NA 138 09/18/2014 1609   K 3.9 10/01/2023 1508   K 3.5 09/18/2014 1609   CL 104 10/01/2023 1508   CL 102 09/18/2014 1609   CO2 28 10/01/2023 1508   CO2 26 09/18/2014 1609   GLUCOSE 107 10/01/2023 1508   GLUCOSE 96 09/18/2014 1609   BUN 11 10/01/2023 1508   BUN 5 (L) 09/18/2014 1609   CREATININE 0.74 10/01/2023 1508   CALCIUM  9.6 10/01/2023 1508   CALCIUM  8.7 09/18/2014 1609   GFRNONAA >60 06/27/2020 1556   GFRNONAA >60 09/18/2014 1609   GFRNONAA >60 01/19/2012 1125   GFRAA >60 08/21/2018 1317   GFRAA >60 09/18/2014 1609   GFRAA >60 01/19/2012 1125    Lipid Panel     Component Value Date/Time   CHOL 166 10/01/2023 1508   CHOL 143 09/01/2015 1202   TRIG 101 10/01/2023 1508   HDL 50 10/01/2023 1508   HDL 65 09/01/2015 1202   CHOLHDL 3.3 10/01/2023 1508   LDLCALC 97 10/01/2023 1508    CBC    Component Value Date/Time   WBC 6.4 10/01/2023 1508   RBC 4.51 10/01/2023 1508   HGB 13.6 10/01/2023 1508   HGB 8.8 (L) 03/05/2017 0949   HCT 40.6 10/01/2023 1508   HCT 29.3 (L) 03/05/2017 0949   PLT 240 10/01/2023 1508   PLT 234 08/31/2016 0926   MCV 90.0 10/01/2023 1508   MCV 78 (L) 08/31/2016 0926   MCV 79 (L) 09/18/2014 1609   MCH 30.2 10/01/2023 1508   MCHC 33.5 10/01/2023 1508   RDW 11.7 10/01/2023 1508   RDW 15.6 (H) 08/31/2016 0926   RDW 14.5 09/18/2014 1609   LYMPHSABS 1.9 11/20/2021 1007   LYMPHSABS 1.2 08/31/2016 0926   LYMPHSABS 1.4 09/18/2014 1609   MONOABS 0.3 11/20/2021 1007   MONOABS 0.4 09/18/2014 1609   EOSABS 0.1 11/20/2021 1007   EOSABS 0.0 08/31/2016 0926   EOSABS 0.0 09/18/2014 1609   BASOSABS 0.0 11/20/2021 1007   BASOSABS 0.0  08/31/2016 0926   BASOSABS 0.0 09/18/2014 1609    Hgb A1C Lab Results  Component Value Date   HGBA1C 5.4 10/01/2023     Assessment and Plan:   RTC in 6 months for your annual exam  Angeline Laura, NP

## 2024-06-02 NOTE — Assessment & Plan Note (Signed)
Managed with diet, no medications ?We will monitor ?

## 2024-06-02 NOTE — Assessment & Plan Note (Signed)
 Not medicated She follows with GI

## 2024-06-03 LAB — CBC WITH DIFFERENTIAL/PLATELET
Basophils Absolute: 0 x10E3/uL (ref 0.0–0.2)
Basos: 1 %
EOS (ABSOLUTE): 0 x10E3/uL (ref 0.0–0.4)
Eos: 1 %
Hematocrit: 43.1 % (ref 34.0–46.6)
Hemoglobin: 14 g/dL (ref 11.1–15.9)
Immature Grans (Abs): 0 x10E3/uL (ref 0.0–0.1)
Immature Granulocytes: 0 %
Lymphocytes Absolute: 1.8 x10E3/uL (ref 0.7–3.1)
Lymphs: 28 %
MCH: 30 pg (ref 26.6–33.0)
MCHC: 32.5 g/dL (ref 31.5–35.7)
MCV: 93 fL (ref 79–97)
Monocytes Absolute: 0.4 x10E3/uL (ref 0.1–0.9)
Monocytes: 7 %
Neutrophils Absolute: 4.1 x10E3/uL (ref 1.4–7.0)
Neutrophils: 63 %
Platelets: 230 x10E3/uL (ref 150–450)
RBC: 4.66 x10E6/uL (ref 3.77–5.28)
RDW: 11.8 % (ref 11.7–15.4)
WBC: 6.5 x10E3/uL (ref 3.4–10.8)

## 2024-06-03 LAB — COMPREHENSIVE METABOLIC PANEL WITH GFR
ALT: 26 IU/L (ref 0–32)
AST: 23 IU/L (ref 0–40)
Albumin: 4.9 g/dL (ref 3.9–4.9)
Alkaline Phosphatase: 85 IU/L (ref 41–116)
BUN/Creatinine Ratio: 16 (ref 9–23)
BUN: 13 mg/dL (ref 6–20)
Bilirubin Total: 0.6 mg/dL (ref 0.0–1.2)
CO2: 22 mmol/L (ref 20–29)
Calcium: 9.8 mg/dL (ref 8.7–10.2)
Chloride: 103 mmol/L (ref 96–106)
Creatinine, Ser: 0.83 mg/dL (ref 0.57–1.00)
Globulin, Total: 2.7 g/dL (ref 1.5–4.5)
Glucose: 97 mg/dL (ref 70–99)
Potassium: 4.5 mmol/L (ref 3.5–5.2)
Sodium: 140 mmol/L (ref 134–144)
Total Protein: 7.6 g/dL (ref 6.0–8.5)
eGFR: 96 mL/min/1.73 (ref >59)

## 2024-06-03 LAB — CORTISOL: Cortisol: 14 ug/dL (ref 6.2–19.4)

## 2024-06-03 LAB — TSH+FREE T4
Free T4: 1.49 ng/dL (ref 0.82–1.77)
TSH: 1.66 u[IU]/mL (ref 0.450–4.500)

## 2024-06-04 ENCOUNTER — Ambulatory Visit: Payer: Self-pay | Admitting: Obstetrics and Gynecology

## 2024-06-05 NOTE — Progress Notes (Deleted)
    OBSTETRICS/GYNECOLOGY POST-OPERATIVE CLINIC VISIT  Subjective:     Sarah Edwards is a 33 y.o. female who presents to the clinic 2 weeks status post vaginal skin tag removal.  Eating a regular diet {with-without:5700} difficulty. Bowel movements are {normal/abnormal***:19619}. {pain control:13522::The patient is not having any pain.}  {Common ambulatory SmartLinks:19316}  Review of Systems {ros; complete:30496}   Objective:   LMP 05/25/2024 (Approximate)  There is no height or weight on file to calculate BMI.  General:  alert and no distress  Abdomen: soft, bowel sounds active, non-tender  Incision:   {incision:13716::no dehiscence,incision well approximated,healing well,no drainage,no erythema,no hernia,no seroma,no swelling}    Pathology:  FINAL DIAGNOSIS        1. Vaginal, excisional biopsy, mucosal tag :       -  FIBROEPITHELIAL POLYP WITH REACTIVE/REPARATIVE TYPE CHANGE, NEGATIVE FOR       DYSPLASIA.       -  AN IMMUNOHISTOCHEMICAL STAIN FOR P16 IS NEGATIVE.   Assessment:   Patient s/p *** (surgery)  {doing well:13525::Doing well postoperatively.}   Plan:   1. Continue any current medications as instructed by provider. 2. Wound care discussed. 3. Operative findings again reviewed. Pathology report discussed. 4. Activity restrictions: {restrictions:13723} 5. Anticipated return to work: {work return:14002}. 6. Follow up: {8-89:86212} {time; units:18646} for ***    Vicci Rollo BRAVO, RN [facility]

## 2024-06-09 ENCOUNTER — Telehealth: Payer: Self-pay | Admitting: Obstetrics

## 2024-06-09 ENCOUNTER — Encounter: Admitting: Obstetrics

## 2024-06-09 NOTE — Telephone Encounter (Signed)
 Reached out to pt to reschedule post op appt that was scheduled on 06/09/2024 at 10:35 with Dr. Leigh.  Was able to reschedule the appt to 07/01/2024 at 3:55 with Dr. Leigh.

## 2024-06-29 NOTE — Progress Notes (Signed)
    OBSTETRICS/GYNECOLOGY POST-OPERATIVE CLINIC VISIT  Subjective:     Sarah Edwards is a 33 y.o. female who presents to the clinic 5 weeks status post vaginal skin tag removal. Eating a regular diet without difficulty. Bowel movements are normal. The patient is not having any pain. Has not resumed intercourse yet, is nervous.  The following portions of the patient's history were reviewed and updated as appropriate: allergies, current medications, past family history, past medical history, past social history, past surgical history, and problem list.  Review of Systems Pertinent items are noted in HPI.   Objective:   BP 117/70   Pulse 76   Wt 152 lb (68.9 kg)   LMP 05/25/2024 (Approximate)   BMI 25.29 kg/m  Body mass index is 25.29 kg/m.  General:  alert and no distress  Abdomen: soft, bowel sounds active, non-tender  Incision:   healing well, no drainage, no erythema, no hernia, no seroma, no swelling, no dehiscence, incision well approximated    Pathology:  FINAL DIAGNOSIS        1. Vaginal, excisional biopsy, mucosal tag :       -  FIBROEPITHELIAL POLYP WITH REACTIVE/REPARATIVE TYPE CHANGE, NEGATIVE FOR       DYSPLASIA.       -  AN IMMUNOHISTOCHEMICAL STAIN FOR P16 IS NEGATIVE.   Assessment:   Patient s/p vaginal skin tag removal (surgery)  Doing well postoperatively.   Plan:   1. Continue any current medications as instructed by provider. 2. Wound care discussed. 3. Operative findings again reviewed. Pathology report discussed. 4. Activity restrictions: none; may resume sexual activity when ready 5. Anticipated return to work: not applicable. 6. Follow up: as needed; June 2026 for annual exam   Estil Mangle, DO [facility]

## 2024-07-01 ENCOUNTER — Ambulatory Visit: Admitting: Obstetrics

## 2024-07-01 VITALS — BP 117/70 | HR 76 | Wt 152.0 lb

## 2024-07-01 DIAGNOSIS — Z4889 Encounter for other specified surgical aftercare: Secondary | ICD-10-CM

## 2024-07-06 ENCOUNTER — Encounter: Payer: Self-pay | Admitting: Obstetrics

## 2024-07-27 ENCOUNTER — Ambulatory Visit: Admission: EM | Admit: 2024-07-27 | Discharge: 2024-07-27 | Disposition: A | Discharge status: Home / Self Care

## 2024-07-27 DIAGNOSIS — H6192 Disorder of left external ear, unspecified: Secondary | ICD-10-CM | POA: Diagnosis not present

## 2024-07-27 MED ORDER — CIPROFLOXACIN-DEXAMETHASONE 0.3-0.1 % OT SUSP: 4.0000 [drp] | Freq: Two times a day (BID) | OTIC | 0 refills | Status: AC

## 2024-07-27 NOTE — ED Provider Notes (Signed)
 [facility]    CSN: [id number] Arrival date & time: 07/27/24  9188      History   Chief Complaint Chief Complaint  Patient presents with   Ear Problem    HPI Sarah Edwards is a 33 y.o. female.   HPI  33 year old female with past medical history significant for anxiety and depression, GERD, Crohn's disease, chronic UTIs, and endometriosis presents for evaluation of bloody drainage from the left ear.  She reports that she woke up yesterday with a feeling like there was water in her ear, went to clean her ear with a Q-tip and the Q-tip came out covered in bright red blood.  She denies any respiratory symptoms.  She currently has gauze in her ear canal held and with tape.  Past Medical History:  Diagnosis Date   Abnormal uterine bleeding (AUB)    Anemia    Anemia during pregnancy in third trimester 03/21/2017   Anxiety    Asthma    BRCA negative 03/2024   MyRisk neg except POLE VUS   Bright red rectal bleeding 08/19/2018   Chronic UTI    Crohn disease (HCC)    Endometriosis    Family history of breast cancer 03/2024   IBIS=18.4%/riskscore=17.9%   Family history of ovarian cancer    IBS (irritable bowel syndrome)    Painful menstrual periods    SAB (spontaneous abortion) 01/06/2014    Patient Active Problem List   Diagnosis Date Noted   Pruritus 06/02/2024   Crohn's disease without complication, unspecified gastrointestinal tract location (HCC) 11/23/2021   Synovitis and tenosynovitis 03/24/2020   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   Acid reflux 01/12/2016   Anxiety and depression 01/07/2015    Past Surgical History:  Procedure Laterality Date   ANKLE RECONSTRUCTION Right    COLONOSCOPY N/A 2010 and 2011   crohns   ESOPHAGOGASTRODUODENOSCOPY ENDOSCOPY N/A 2010   EXCISION OF SKIN TAG N/A 05/25/2024   Procedure: EXCISION, SKIN TAG;  Surgeon: Leigh Sober, MD;  Location: [facility];  Service: Gynecology;  Laterality: N/A;  Vaginal    laproscopy  2012   TONSILLECTOMY     at the age of 83    OB History     Gravida  3   Para  2   Term  2   Preterm  0   AB  1   Living  2      SAB  1   IAB  0   Ectopic  0   Multiple  0   Live Births  2            Home Medications    Prior to Admission medications   Medication Sig Start Date End Date Taking? Authorizing Provider  ciprofloxacin-dexamethasone  (CIPRODEX) OTIC suspension Place 4 drops into the left ear 2 (two) times daily. 07/27/24  Yes Bernardino Ditch, NP  hydrOXYzine  (ATARAX ) 10 MG tablet Take 10 mg by mouth 2 (two) times daily as needed. 06/06/24  Yes [provider]  meloxicam  (MOBIC ) 15 MG tablet Take 15 mg by mouth daily. 06/30/24  Yes [provider]  cetirizine (ZYRTEC) 10 MG tablet Take 10 mg by mouth daily.    [provider]  escitalopram  (LEXAPRO ) 10 MG tablet Take 1 tablet (10 mg total) by mouth daily. 06/02/24   Copland, Alicia B, PA-C  ibuprofen  (ADVIL ) 800 MG tablet Take 1 tablet (800 mg total) by mouth every 8 (eight) hours as needed for moderate pain (pain  score 4-6) or cramping. 05/25/24   Leigh Sober, MD  levonorgestrel  (MIRENA ) 20 MCG/24HR IUD 1 each by Intrauterine route once.    [provider]  Multiple Vitamins-Minerals (WOMENS MULTI PO) Take 1 tablet by mouth daily.    [provider]  ondansetron  (ZOFRAN -ODT) 4 MG disintegrating tablet Take 1 tablet (4 mg total) by mouth every 8 (eight) hours as needed for nausea or vomiting. Patient not taking: Reported on 07/01/2024 01/02/24   Vonna Sharlet POUR, MD  OVER THE COUNTER MEDICATION Take 1 capsule by mouth daily. Mushroom vitamin    [provider]  OVER THE COUNTER MEDICATION Take 1 capsule by mouth daily. Beef organ    [provider]  VITAMIN D  PO Take 1 tablet by mouth daily.    [provider]    Family History Family History  Problem Relation Age of Onset   Diabetes Mother    Hypertension Mother     Diabetes Father    Heart disease Father    Hypertension Father    Heart attack Father    Coronary artery disease Father    Healthy Brother    Ovarian cancer Maternal Aunt        unknown age   Pancreatic cancer Maternal Uncle        unknown age   Breast cancer Maternal Grandmother        64s   Leukemia Maternal Grandfather 75   Breast cancer Paternal Grandmother        30s   Ovarian cancer Paternal Grandmother    Cervical cancer Paternal Grandmother    Lung cancer Paternal Grandfather    Colon cancer Neg Hx     Social History Social History   Tobacco Use   Smoking status: Former    Current packs/day: 0.00    Average packs/day: 0.5 packs/day for 3.0 years (1.5 ttl pk-yrs)    Types: Cigarettes    Start date: 03/17/2012    Quit date: 03/18/2015    Years since quitting: 9.3   Smokeless tobacco: Never  Vaping Use   Vaping status: Some Days   Substances: THC   Devices: THC Oil  Substance Use Topics   Alcohol use: Not Currently   Drug use: Yes    Types: Marijuana    Comment: oil     Allergies   Amoxicillin, Penicillin g, Azithromycin, Penicillins, and Sulfa antibiotics   Review of Systems Review of Systems  Constitutional:  Negative for fever.  HENT:  Positive for ear discharge and ear pain. Negative for congestion and rhinorrhea.      Physical Exam Triage Vital Signs ED Triage Vitals  Encounter Vitals Group     BP 07/27/24 0834 116/74     Girls Systolic BP Percentile --      Girls Diastolic BP Percentile --      Boys Systolic BP Percentile --      Boys Diastolic BP Percentile --      Pulse Rate 07/27/24 0834 70     Resp 07/27/24 0834 14     Temp 07/27/24 0834 98.8 F (37.1 C)     Temp Source 07/27/24 0834 Oral     SpO2 07/27/24 0834 96 %     Weight 07/27/24 0831 160 lb 9.3 oz (72.8 kg)     Height --      Head Circumference --      Peak Flow --      Pain Score 07/27/24 0833 0     Pain  Loc --      Pain Education --      Exclude from Growth Chart --     No data found.  Updated Vital Signs BP 116/74 (BP Location: Left Arm)   Pulse 70   Temp 98.8 F (37.1 C) (Oral)   Resp 14   Wt 160 lb 9.3 oz (72.8 kg)   SpO2 96%   BMI 26.72 kg/m   Visual Acuity Right Eye Distance:   Left Eye Distance:   Bilateral Distance:    Right Eye Near:   Left Eye Near:    Bilateral Near:     Physical Exam Vitals and nursing note reviewed.  Constitutional:      Appearance: Normal appearance. She is not ill-appearing.  HENT:     Head: Normocephalic and atraumatic.     Left Ear: Tympanic membrane and external ear normal. There is no impacted cerumen.     Ears:     Comments: The left tympanic membrane appears to be intact.  There is blood on the floor of the external auditory canal as well as some bruising.  No visible lesion. Neurological:     Mental Status: She is alert.      UC Treatments / Results  Labs (all labs ordered are listed, but only abnormal results are displayed) Labs Reviewed - No data to display  EKG   Radiology No results found.  Procedures Procedures (including critical care time)  Medications Ordered in UC Medications - No data to display  Initial Impression / Assessment and Plan / UC Course  I have reviewed the triage vital signs and the nursing notes.  Pertinent labs & imaging results that were available during my care of the patient were reviewed by me and considered in my medical decision making (see chart for details).   Patient is a nontoxic-appearing 33 year old female presenting for evaluation of bleeding from the left ear as outlined in HPI above.  In the exam room the patient has gauze in her ear that is held with tape.  I did remove it and there was dried blood on the gauze but not a significant amount.  Otoscopic inspection of the left external auditory canal does reveal presence of blood on the floor the canal as well as bruising.  The tympanic membrane itself appears to be intact.  I will discharge her  home on Ciprodex twice daily for a week to cover for potential an infection as I suspect that she has an a lesion on the floor of her ear canal that ruptured with Q-tip insertion.  I will have her wear a silicone earplug in her ear to prevent water injury and follow-up with her PCP for reevaluation in approximately 2 weeks.   Final Clinical Impressions(s) / UC Diagnoses   Final diagnoses:  Lesion of external auditory canal, left     Discharge Instructions      Instill 4 drops of Ciprodex in your left ear canal twice daily for the next week to prevent infection.  Protect your external auditory canal from water and treat using a silicone earplug when you are in the shower or when swimming.  You may use over-the-counter Tylenol  and or ibuprofen  according to the package instructions as needed for any pain.  Please schedule a follow-up appointment with your primary care provider for approximately 2 weeks from today to have your ear canal reexamined to ensure that everything has healed properly.  If you develop any new or worsening symptoms you may  also return for reevaluation or follow-up with ENT.     ED Prescriptions     Medication Sig Dispense Auth. Provider   ciprofloxacin-dexamethasone  (CIPRODEX) OTIC suspension Place 4 drops into the left ear 2 (two) times daily. 7.5 mL Bernardino Ditch, NP      PDMP not reviewed this encounter.   Bernardino Ditch, NP 07/27/24 [phone]

## 2024-07-27 NOTE — Discharge Instructions (Addendum)
 Instill 4 drops of Ciprodex in your left ear canal twice daily for the next week to prevent infection.  Protect your external auditory canal from water and treat using a silicone earplug when you are in the shower or when swimming.  You may use over-the-counter Tylenol  and or ibuprofen  according to the package instructions as needed for any pain.  Please schedule a follow-up appointment with your primary care provider for approximately 2 weeks from today to have your ear canal reexamined to ensure that everything has healed properly.  If you develop any new or worsening symptoms you may also return for reevaluation or follow-up with ENT.

## 2024-07-27 NOTE — ED Triage Notes (Signed)
 Pt c/o L ear drainage & pain x1 day. States woke up with fullness then put a q-tip in ear & when she brought it out q-tip was covered in blood.

## 2024-11-19 ENCOUNTER — Encounter: Admitting: Internal Medicine
